# Patient Record
Sex: Female | Born: 1970 | Race: White | Hispanic: No | Smoking: Current every day smoker
Health system: Southern US, Community
[De-identification: ages and names within clinical notes are randomized; demographics above are authoritative.]

## PROBLEM LIST (undated history)

## (undated) DIAGNOSIS — M1990 Unspecified osteoarthritis, unspecified site: Secondary | ICD-10-CM

## (undated) DIAGNOSIS — K219 Gastro-esophageal reflux disease without esophagitis: Secondary | ICD-10-CM

## (undated) DIAGNOSIS — F429 Obsessive-compulsive disorder, unspecified: Secondary | ICD-10-CM

## (undated) DIAGNOSIS — F419 Anxiety disorder, unspecified: Secondary | ICD-10-CM

## (undated) DIAGNOSIS — R079 Chest pain, unspecified: Secondary | ICD-10-CM

## (undated) DIAGNOSIS — M479 Spondylosis, unspecified: Secondary | ICD-10-CM

## (undated) DIAGNOSIS — E119 Type 2 diabetes mellitus without complications: Secondary | ICD-10-CM

## (undated) DIAGNOSIS — G5790 Unspecified mononeuropathy of unspecified lower limb: Secondary | ICD-10-CM

## (undated) DIAGNOSIS — F32A Depression, unspecified: Secondary | ICD-10-CM

## (undated) DIAGNOSIS — F988 Other specified behavioral and emotional disorders with onset usually occurring in childhood and adolescence: Secondary | ICD-10-CM

## (undated) DIAGNOSIS — G709 Myoneural disorder, unspecified: Secondary | ICD-10-CM

## (undated) DIAGNOSIS — F329 Major depressive disorder, single episode, unspecified: Secondary | ICD-10-CM

## (undated) DIAGNOSIS — G5603 Carpal tunnel syndrome, bilateral upper limbs: Secondary | ICD-10-CM

## (undated) DIAGNOSIS — R918 Other nonspecific abnormal finding of lung field: Secondary | ICD-10-CM

## (undated) DIAGNOSIS — M5020 Other cervical disc displacement, unspecified cervical region: Secondary | ICD-10-CM

## (undated) DIAGNOSIS — K3184 Gastroparesis: Secondary | ICD-10-CM

## (undated) DIAGNOSIS — F4310 Post-traumatic stress disorder, unspecified: Secondary | ICD-10-CM

## (undated) DIAGNOSIS — L988 Other specified disorders of the skin and subcutaneous tissue: Secondary | ICD-10-CM

## (undated) DIAGNOSIS — H9190 Unspecified hearing loss, unspecified ear: Secondary | ICD-10-CM

## (undated) DIAGNOSIS — I639 Cerebral infarction, unspecified: Secondary | ICD-10-CM

## (undated) DIAGNOSIS — M797 Fibromyalgia: Secondary | ICD-10-CM

## (undated) DIAGNOSIS — R440 Auditory hallucinations: Secondary | ICD-10-CM

## (undated) DIAGNOSIS — Z8489 Family history of other specified conditions: Secondary | ICD-10-CM

## (undated) DIAGNOSIS — E236 Other disorders of pituitary gland: Secondary | ICD-10-CM

## (undated) DIAGNOSIS — IMO0002 Reserved for concepts with insufficient information to code with codable children: Secondary | ICD-10-CM

## (undated) DIAGNOSIS — S129XXA Fracture of neck, unspecified, initial encounter: Secondary | ICD-10-CM

## (undated) DIAGNOSIS — L400 Psoriasis vulgaris: Secondary | ICD-10-CM

## (undated) DIAGNOSIS — S92902A Unspecified fracture of left foot, initial encounter for closed fracture: Secondary | ICD-10-CM

## (undated) HISTORY — DX: Major depressive disorder, single episode, unspecified: F32.9

## (undated) HISTORY — DX: Unspecified fracture of left foot, initial encounter for closed fracture: S92.902A

## (undated) HISTORY — DX: Psoriasis vulgaris: L40.0

## (undated) HISTORY — DX: Gastro-esophageal reflux disease without esophagitis: K21.9

## (undated) HISTORY — DX: Type 2 diabetes mellitus without complications: E11.9

## (undated) HISTORY — PX: SPINE SURGERY: SHX786

## (undated) HISTORY — DX: Reserved for concepts with insufficient information to code with codable children: IMO0002

## (undated) HISTORY — DX: Unspecified mononeuropathy of unspecified lower limb: G57.90

## (undated) HISTORY — DX: Auditory hallucinations: R44.0

## (undated) HISTORY — DX: Unspecified hearing loss, unspecified ear: H91.90

## (undated) HISTORY — DX: Other nonspecific abnormal finding of lung field: R91.8

## (undated) HISTORY — PX: NASAL SEPTUM SURGERY: SHX37

## (undated) HISTORY — PX: OTHER SURGICAL HISTORY: SHX169

## (undated) HISTORY — DX: Other cervical disc displacement, unspecified cervical region: M50.20

## (undated) HISTORY — DX: Carpal tunnel syndrome, bilateral upper limbs: G56.03

## (undated) HISTORY — DX: Gastroparesis: K31.84

## (undated) HISTORY — DX: Obsessive-compulsive disorder, unspecified: F42.9

## (undated) HISTORY — DX: Anxiety disorder, unspecified: F41.9

## (undated) HISTORY — DX: Unspecified osteoarthritis, unspecified site: M19.90

## (undated) HISTORY — DX: Other specified behavioral and emotional disorders with onset usually occurring in childhood and adolescence: F98.8

## (undated) HISTORY — DX: Spondylosis, unspecified: M47.9

## (undated) HISTORY — PX: FRACTURE SURGERY: SHX138

## (undated) HISTORY — DX: Myoneural disorder, unspecified: G70.9

## (undated) HISTORY — DX: Other disorders of pituitary gland: E23.6

## (undated) HISTORY — DX: Chest pain, unspecified: R07.9

## (undated) HISTORY — DX: Post-traumatic stress disorder, unspecified: F43.10

## (undated) HISTORY — DX: Fracture of neck, unspecified, initial encounter: S12.9XXA

## (undated) HISTORY — DX: Other specified disorders of the skin and subcutaneous tissue: L98.8

## (undated) HISTORY — DX: Depression, unspecified: F32.A

## (undated) HISTORY — PX: MOUTH SURGERY: SHX715

---

## 2006-02-09 DIAGNOSIS — S129XXA Fracture of neck, unspecified, initial encounter: Secondary | ICD-10-CM

## 2006-02-09 HISTORY — DX: Fracture of neck, unspecified, initial encounter: S12.9XXA

## 2006-02-19 ENCOUNTER — Emergency Department: Payer: Self-pay | Admitting: Internal Medicine

## 2007-04-18 ENCOUNTER — Ambulatory Visit: Payer: Self-pay | Admitting: Family Medicine

## 2008-07-14 ENCOUNTER — Emergency Department: Payer: Self-pay | Admitting: Emergency Medicine

## 2008-08-28 ENCOUNTER — Emergency Department: Payer: Self-pay | Admitting: Emergency Medicine

## 2009-07-11 DIAGNOSIS — L0100 Impetigo, unspecified: Secondary | ICD-10-CM | POA: Insufficient documentation

## 2011-03-10 DIAGNOSIS — M47812 Spondylosis without myelopathy or radiculopathy, cervical region: Secondary | ICD-10-CM | POA: Insufficient documentation

## 2011-06-29 ENCOUNTER — Ambulatory Visit: Payer: Self-pay | Admitting: Family Medicine

## 2011-12-22 ENCOUNTER — Encounter: Payer: Self-pay | Admitting: Physical Medicine & Rehabilitation

## 2012-01-12 ENCOUNTER — Ambulatory Visit: Payer: Self-pay | Admitting: Physical Medicine & Rehabilitation

## 2012-01-22 ENCOUNTER — Ambulatory Visit (HOSPITAL_BASED_OUTPATIENT_CLINIC_OR_DEPARTMENT_OTHER): Payer: Self-pay | Admitting: Physical Medicine & Rehabilitation

## 2012-01-22 ENCOUNTER — Encounter: Payer: Self-pay | Attending: Physical Medicine & Rehabilitation

## 2012-01-22 ENCOUNTER — Telehealth: Payer: Self-pay

## 2012-01-22 ENCOUNTER — Encounter: Payer: Self-pay | Admitting: Physical Medicine & Rehabilitation

## 2012-01-22 ENCOUNTER — Other Ambulatory Visit: Payer: Self-pay | Admitting: *Deleted

## 2012-01-22 VITALS — BP 115/67 | HR 103 | Resp 14 | Ht 65.5 in | Wt 179.6 lb

## 2012-01-22 DIAGNOSIS — IMO0001 Reserved for inherently not codable concepts without codable children: Secondary | ICD-10-CM

## 2012-01-22 DIAGNOSIS — Z981 Arthrodesis status: Secondary | ICD-10-CM | POA: Insufficient documentation

## 2012-01-22 DIAGNOSIS — F17200 Nicotine dependence, unspecified, uncomplicated: Secondary | ICD-10-CM | POA: Insufficient documentation

## 2012-01-22 DIAGNOSIS — M797 Fibromyalgia: Secondary | ICD-10-CM | POA: Insufficient documentation

## 2012-01-22 DIAGNOSIS — M545 Low back pain, unspecified: Secondary | ICD-10-CM | POA: Insufficient documentation

## 2012-01-22 DIAGNOSIS — G5600 Carpal tunnel syndrome, unspecified upper limb: Secondary | ICD-10-CM

## 2012-01-22 DIAGNOSIS — F4310 Post-traumatic stress disorder, unspecified: Secondary | ICD-10-CM | POA: Insufficient documentation

## 2012-01-22 DIAGNOSIS — G5601 Carpal tunnel syndrome, right upper limb: Secondary | ICD-10-CM | POA: Insufficient documentation

## 2012-01-22 DIAGNOSIS — R209 Unspecified disturbances of skin sensation: Secondary | ICD-10-CM | POA: Insufficient documentation

## 2012-01-22 DIAGNOSIS — M542 Cervicalgia: Secondary | ICD-10-CM | POA: Insufficient documentation

## 2012-01-22 MED ORDER — GABAPENTIN 100 MG PO CAPS: 100.0000 mg | ORAL_CAPSULE | Freq: Three times a day (TID) | ORAL | Status: DC

## 2012-01-22 MED ORDER — TRAMADOL HCL 50 MG PO TABS: 50.0000 mg | ORAL_TABLET | Freq: Three times a day (TID) | ORAL | Status: DC | PRN

## 2012-01-22 MED ORDER — CYCLOBENZAPRINE HCL 10 MG PO TABS: ORAL_TABLET | ORAL | Status: DC

## 2012-01-22 NOTE — Telephone Encounter (Signed)
Spoke with patient earlier. Prescription was escribed over to [facility]
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
## 2012-01-22 NOTE — Telephone Encounter (Signed)
Patient called stating Flexeril was going to be sent to [facility] pharmacy. Patient is at her pharmacy and they do not have her prescription. Prescription was Escribed. Patient aware.

## 2012-01-22 NOTE — Patient Instructions (Addendum)
Change Flexeril to 20 mg at night and 10 mg in the day only as needed  Buy a right wrist splint and wear it at night

## 2012-01-22 NOTE — Telephone Encounter (Signed)
Patient is at [facility] and says they do not have flexeril script.  Please call.

## 2012-02-26 ENCOUNTER — Ambulatory Visit: Payer: Self-pay | Admitting: Physical Medicine & Rehabilitation

## 2012-02-26 ENCOUNTER — Encounter: Payer: Self-pay | Attending: Physical Medicine & Rehabilitation

## 2012-02-26 DIAGNOSIS — M542 Cervicalgia: Secondary | ICD-10-CM | POA: Insufficient documentation

## 2012-02-26 DIAGNOSIS — M545 Low back pain, unspecified: Secondary | ICD-10-CM | POA: Insufficient documentation

## 2012-02-26 DIAGNOSIS — IMO0001 Reserved for inherently not codable concepts without codable children: Secondary | ICD-10-CM | POA: Insufficient documentation

## 2012-02-26 DIAGNOSIS — Z981 Arthrodesis status: Secondary | ICD-10-CM | POA: Insufficient documentation

## 2012-02-26 DIAGNOSIS — F4310 Post-traumatic stress disorder, unspecified: Secondary | ICD-10-CM | POA: Insufficient documentation

## 2012-02-26 DIAGNOSIS — F17200 Nicotine dependence, unspecified, uncomplicated: Secondary | ICD-10-CM | POA: Insufficient documentation

## 2012-02-26 DIAGNOSIS — R209 Unspecified disturbances of skin sensation: Secondary | ICD-10-CM | POA: Insufficient documentation

## 2012-03-15 ENCOUNTER — Other Ambulatory Visit: Payer: Self-pay | Admitting: Physical Medicine & Rehabilitation

## 2012-03-22 ENCOUNTER — Encounter: Payer: Self-pay | Attending: Physical Medicine & Rehabilitation

## 2012-03-22 ENCOUNTER — Ambulatory Visit (HOSPITAL_BASED_OUTPATIENT_CLINIC_OR_DEPARTMENT_OTHER): Payer: Self-pay | Admitting: Physical Medicine & Rehabilitation

## 2012-03-22 ENCOUNTER — Encounter: Payer: Self-pay | Admitting: Physical Medicine & Rehabilitation

## 2012-03-22 VITALS — BP 117/67 | HR 112 | Resp 14 | Ht 65.0 in | Wt 176.0 lb

## 2012-03-22 DIAGNOSIS — G5601 Carpal tunnel syndrome, right upper limb: Secondary | ICD-10-CM

## 2012-03-22 DIAGNOSIS — IMO0001 Reserved for inherently not codable concepts without codable children: Secondary | ICD-10-CM | POA: Insufficient documentation

## 2012-03-22 DIAGNOSIS — M545 Low back pain, unspecified: Secondary | ICD-10-CM | POA: Insufficient documentation

## 2012-03-22 DIAGNOSIS — R209 Unspecified disturbances of skin sensation: Secondary | ICD-10-CM | POA: Insufficient documentation

## 2012-03-22 DIAGNOSIS — F4310 Post-traumatic stress disorder, unspecified: Secondary | ICD-10-CM | POA: Insufficient documentation

## 2012-03-22 DIAGNOSIS — M542 Cervicalgia: Secondary | ICD-10-CM | POA: Insufficient documentation

## 2012-03-22 DIAGNOSIS — G5600 Carpal tunnel syndrome, unspecified upper limb: Secondary | ICD-10-CM

## 2012-03-22 DIAGNOSIS — F17200 Nicotine dependence, unspecified, uncomplicated: Secondary | ICD-10-CM | POA: Insufficient documentation

## 2012-03-22 DIAGNOSIS — Z981 Arthrodesis status: Secondary | ICD-10-CM | POA: Insufficient documentation

## 2012-03-22 DIAGNOSIS — G5610 Other lesions of median nerve, unspecified upper limb: Secondary | ICD-10-CM

## 2012-03-22 MED ORDER — TRAMADOL HCL 50 MG PO TABS: 50.0000 mg | ORAL_TABLET | Freq: Four times a day (QID) | ORAL | Status: DC | PRN

## 2012-03-22 NOTE — Patient Instructions (Signed)

## 2012-03-22 NOTE — Progress Notes (Signed)
Subjective:    Patient ID: Becky Hubbard, female    DOB: [birth year], 42 y.o.   MRN: [id number]  HPI Right hand numbness Pain Inventory Average Pain 7 Pain Right Now 7 My pain is sharp, dull, tingling and aching  In the last 24 hours, has pain interfered with the following? General activity 5 Relation with others 7 Enjoyment of life 6 What TIME of day is your pain at its worst? morning and day Sleep (in general) Poor  Pain is worse with: bending, sitting, standing and some activites Pain improves with: rest, heat/ice, therapy/exercise, medication and TENS Relief from Meds: 3  Mobility walk without assistance how many minutes can you walk? 15 ability to climb steps?  yes do you drive?  yes transfers alone Do you have any goals in this area?  yes  Function not employed: date last employed unknown I need assistance with the following:  dressing, meal prep, household duties and shopping Do you have any goals in this area?  yes  Neuro/Psych weakness numbness tremor tingling trouble walking spasms dizziness confusion depression anxiety loss of taste or smell  Prior Studies Any changes since last visit?  no  Physicians involved in your care Any changes since last visit?  no   Family History  Problem Relation Age of Onset  . Cancer Mother 63    breast cancer  . Cancer Father 91    of spine , pancreas and liver  . Diabetes Father    History   Social History  . Marital Status: Unknown    Spouse Name: N/A    Number of Children: N/A  . Years of Education: N/A   Social History Main Topics  . Smoking status: Current Some Day Smoker -- 0.30 packs/day for 12 years    Types: Cigarettes    Start date: 03/01/2011  . Smokeless tobacco: Never Used  . Alcohol Use: None  . Drug Use: None  . Sexually Active: None   Other Topics Concern  . None   Social History Narrative  . None   Past Surgical History  Procedure Laterality Date  . Spine surgery     cervical  . Nasal septum surgery    . Fracture surgery    . Occipital nerve blocks    . Cervical radiofrequency neurotomy     Past Medical History  Diagnosis Date  . GERD (gastroesophageal reflux disease)   . Depression   . Arthritis   . Neuromuscular disorder     fibromyalgia/chronic fatique syndrome  . Plaque psoriasis   . Other specified disorder of skin     neurological skin disorder-breaks out when nervous  . Ulcer   . Anxiety    BP 117/67  Pulse 112  Resp 14  Ht 5\' 5"  (1.651 m)  Wt 176 lb (79.833 kg)  BMI 29.29 kg/m2  SpO2 98%     Review of Systems  Constitutional: Positive for appetite change and unexpected weight change.  Respiratory: Positive for cough.   Musculoskeletal: Positive for gait problem.  Neurological: Positive for dizziness, tremors, weakness and numbness.  Psychiatric/Behavioral: Positive for confusion and dysphoric mood. The patient is nervous/anxious.   All other systems reviewed and are negative.       Objective:   Physical Exam        Assessment & Plan:  Ultrasound right wrist Distal cross-section 1.38 cm Proximal cross-section 0.91 cm Ratio 1.55 This is diagnostic of carpal tunnel syndrome caused by median nerve compression at the wrist Plan  is to schedule for carpal tunnel injection under ultrasound guidance Continue wrist splint Not using oral corticosteroids due to poor tolerance Discussed with patient agrees with plan Asking for higher dose tramadol. We'll go up to 4 times per day.

## 2012-03-31 ENCOUNTER — Other Ambulatory Visit: Payer: Self-pay | Admitting: Physical Medicine & Rehabilitation

## 2012-04-19 ENCOUNTER — Ambulatory Visit (HOSPITAL_BASED_OUTPATIENT_CLINIC_OR_DEPARTMENT_OTHER): Payer: Self-pay | Admitting: Physical Medicine & Rehabilitation

## 2012-04-19 ENCOUNTER — Encounter: Payer: Self-pay | Attending: Physical Medicine & Rehabilitation

## 2012-04-19 ENCOUNTER — Encounter: Payer: Self-pay | Admitting: Physical Medicine & Rehabilitation

## 2012-04-19 VITALS — BP 123/56 | HR 109 | Resp 14 | Ht 65.0 in | Wt 187.0 lb

## 2012-04-19 DIAGNOSIS — M542 Cervicalgia: Secondary | ICD-10-CM | POA: Insufficient documentation

## 2012-04-19 DIAGNOSIS — Z981 Arthrodesis status: Secondary | ICD-10-CM | POA: Insufficient documentation

## 2012-04-19 DIAGNOSIS — F4310 Post-traumatic stress disorder, unspecified: Secondary | ICD-10-CM | POA: Insufficient documentation

## 2012-04-19 DIAGNOSIS — G5600 Carpal tunnel syndrome, unspecified upper limb: Secondary | ICD-10-CM

## 2012-04-19 DIAGNOSIS — IMO0001 Reserved for inherently not codable concepts without codable children: Secondary | ICD-10-CM | POA: Insufficient documentation

## 2012-04-19 DIAGNOSIS — R209 Unspecified disturbances of skin sensation: Secondary | ICD-10-CM | POA: Insufficient documentation

## 2012-04-19 DIAGNOSIS — G5601 Carpal tunnel syndrome, right upper limb: Secondary | ICD-10-CM

## 2012-04-19 DIAGNOSIS — M545 Low back pain, unspecified: Secondary | ICD-10-CM | POA: Insufficient documentation

## 2012-04-19 DIAGNOSIS — F17200 Nicotine dependence, unspecified, uncomplicated: Secondary | ICD-10-CM | POA: Insufficient documentation

## 2012-04-19 MED ORDER — GABAPENTIN 100 MG PO CAPS: 100.0000 mg | ORAL_CAPSULE | Freq: Three times a day (TID) | ORAL | Status: DC

## 2012-04-19 NOTE — Patient Instructions (Signed)
We did a carpal tunnel injection I injected Depo-Medrol as well as lidocaine We can repeat this in 3 months if needed

## 2012-04-19 NOTE — Progress Notes (Signed)
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 Symptoms are not relieved by conservative care.  Informed consent was obtained after describing risks and benefits of the procedure with the patient. These include bleeding bruising and infection as well as nerve injury. Patient elected to proceed and has given written consent. The distal wrist crease was marked and prepped with Betadine. A 30-gauge 1/2 inch needle was inserted and 0.25 ML's of 1% lidocaine injected into the skin and subcutaneous tissue. Then a 27-gauge 1/2 inch needle was inserted into the carpal tunnel and 0.25 mL of depomedrol 40 mg per mL was injected. Patient tolerated procedure well. Post procedure instructions given.  

## 2012-06-22 ENCOUNTER — Other Ambulatory Visit: Payer: Self-pay

## 2012-06-22 ENCOUNTER — Other Ambulatory Visit: Payer: Self-pay | Admitting: *Deleted

## 2012-06-22 MED ORDER — TRAMADOL HCL 50 MG PO TABS: 50.0000 mg | ORAL_TABLET | Freq: Four times a day (QID) | ORAL | Status: DC | PRN

## 2012-07-21 DIAGNOSIS — K30 Functional dyspepsia: Secondary | ICD-10-CM | POA: Insufficient documentation

## 2012-07-21 DIAGNOSIS — R0789 Other chest pain: Secondary | ICD-10-CM | POA: Insufficient documentation

## 2012-07-21 DIAGNOSIS — K3184 Gastroparesis: Secondary | ICD-10-CM | POA: Insufficient documentation

## 2012-07-21 DIAGNOSIS — R079 Chest pain, unspecified: Secondary | ICD-10-CM | POA: Insufficient documentation

## 2012-07-22 ENCOUNTER — Ambulatory Visit: Payer: Self-pay | Admitting: Physical Medicine & Rehabilitation

## 2012-07-29 ENCOUNTER — Ambulatory Visit: Payer: Self-pay | Admitting: Physical Medicine & Rehabilitation

## 2012-09-01 ENCOUNTER — Other Ambulatory Visit: Payer: Self-pay

## 2012-09-01 MED ORDER — TRAMADOL HCL 50 MG PO TABS: 50.0000 mg | ORAL_TABLET | Freq: Four times a day (QID) | ORAL | Status: DC | PRN

## 2012-09-27 ENCOUNTER — Ambulatory Visit: Payer: Self-pay | Admitting: Physical Medicine & Rehabilitation

## 2012-10-03 ENCOUNTER — Encounter: Payer: Self-pay | Attending: Physical Medicine & Rehabilitation

## 2012-10-03 ENCOUNTER — Encounter: Payer: Self-pay | Admitting: Physical Medicine & Rehabilitation

## 2012-10-03 ENCOUNTER — Ambulatory Visit (HOSPITAL_BASED_OUTPATIENT_CLINIC_OR_DEPARTMENT_OTHER): Payer: No Typology Code available for payment source | Admitting: Physical Medicine & Rehabilitation

## 2012-10-03 VITALS — BP 110/65 | HR 91 | Resp 14 | Ht 65.0 in | Wt 175.0 lb

## 2012-10-03 DIAGNOSIS — M797 Fibromyalgia: Secondary | ICD-10-CM

## 2012-10-03 DIAGNOSIS — M542 Cervicalgia: Secondary | ICD-10-CM

## 2012-10-03 DIAGNOSIS — K219 Gastro-esophageal reflux disease without esophagitis: Secondary | ICD-10-CM | POA: Insufficient documentation

## 2012-10-03 DIAGNOSIS — IMO0001 Reserved for inherently not codable concepts without codable children: Secondary | ICD-10-CM | POA: Insufficient documentation

## 2012-10-03 DIAGNOSIS — Z79899 Other long term (current) drug therapy: Secondary | ICD-10-CM

## 2012-10-03 DIAGNOSIS — G5600 Carpal tunnel syndrome, unspecified upper limb: Secondary | ICD-10-CM | POA: Insufficient documentation

## 2012-10-03 DIAGNOSIS — R209 Unspecified disturbances of skin sensation: Secondary | ICD-10-CM | POA: Insufficient documentation

## 2012-10-03 DIAGNOSIS — F17200 Nicotine dependence, unspecified, uncomplicated: Secondary | ICD-10-CM | POA: Insufficient documentation

## 2012-10-03 DIAGNOSIS — Z5181 Encounter for therapeutic drug level monitoring: Secondary | ICD-10-CM

## 2012-10-03 NOTE — Patient Instructions (Signed)
Will check next x-rays. We can discuss the results at next visit if there is anything serious will call you before that time If left hand not much better next visit will do carpal tunnel injection. Continue to wear the splint on the left hand
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
## 2012-10-12 ENCOUNTER — Other Ambulatory Visit: Payer: Self-pay | Admitting: *Deleted

## 2012-10-12 MED ORDER — TRAMADOL HCL 50 MG PO TABS: 50.0000 mg | ORAL_TABLET | Freq: Four times a day (QID) | ORAL | Status: DC | PRN

## 2012-10-20 ENCOUNTER — Telehealth: Payer: Self-pay

## 2012-10-20 NOTE — Telephone Encounter (Signed)
Message copied by Judd Gaudier on Thu Oct 20, 2012  1:42 PM ------      Message from: Su Monks      Created: Thu Oct 20, 2012 11:04 AM       Non narcotic treatment only.  Inform pt we cannot prescribe tramadol .  See if she still wants to follow up for wrist injection ------

## 2012-10-20 NOTE — Telephone Encounter (Signed)
Patient informed due to inconsistent urine drug screen we can no longer prescribe tramadol.  Offered to follow her for wrist injections but she denied.  Patient says she will seek another provider.

## 2012-10-26 ENCOUNTER — Telehealth: Payer: Self-pay | Admitting: Physical Medicine & Rehabilitation

## 2012-10-26 NOTE — Telephone Encounter (Signed)
Patient was discharged due to an issue with Tramadol and UDS.  Was offered to receive only injections, without meds, but patient declined.  She has reconsidered, and would like to continue to get injections, even if she cannot get Tramadol.  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  Anyway, wants to know: is OK to schedule a new appointment for carpal tunnel injection?

## 2012-10-26 NOTE — Telephone Encounter (Signed)
You would have to ask Dr. Doroteo Bradford whether he would be willing to give her this injection

## 2012-10-26 NOTE — Telephone Encounter (Signed)
See Karen's message below.  Thanks.

## 2012-10-27 NOTE — Telephone Encounter (Signed)
I can give Carpal tunnel injection, may schedule

## 2012-10-28 ENCOUNTER — Telehealth: Payer: Self-pay

## 2012-10-28 NOTE — Telephone Encounter (Signed)
Called and left a voicemail for patient to return call to the clinic.

## 2012-10-28 NOTE — Telephone Encounter (Signed)
Left voice mail for patient to return call to clinic.

## 2012-11-01 ENCOUNTER — Ambulatory Visit: Payer: No Typology Code available for payment source | Admitting: Physical Medicine & Rehabilitation

## 2012-11-10 ENCOUNTER — Telehealth: Payer: Self-pay | Admitting: *Deleted

## 2012-11-11 ENCOUNTER — Telehealth: Payer: Self-pay

## 2012-11-11 NOTE — Telephone Encounter (Signed)
Patient called regarding care.

## 2012-11-11 NOTE — Telephone Encounter (Signed)
I spoke with Becky Hubbard, Becky Hubbard mother since Becky Hubbard was asleep.  She was aware of issues with UDS and I explained that there were medications that were not prescribed in her urine and therefore Dr Wynn Banker will no longer prescribe any pain medication. Tramadol is classified by DEA as a CIV and has abuse potential so this medication falls under the narcotic umbrella.  She was concerned about what Becky Hubbard would do about her back pain.  I referred her back to primary care to see if they would prescribe for her or send her to a new pain clinic.  She brought up a previous pain clinic in [facility] and I questioned why it was that they would not prescribe.  She did not elaborate but I told her that Dr Wynn Banker has agreed to see her for injection only but no medications will be prescribed.

## 2012-11-11 NOTE — Telephone Encounter (Signed)
appt scheduled for CTS injection by [facility]

## 2012-11-28 ENCOUNTER — Telehealth: Payer: Self-pay

## 2012-11-28 NOTE — Telephone Encounter (Signed)
Patient called to let us know that the dentist had prescribed her oxycodone 10-325 #24 for some work so had done on 11/24/12.

## 2012-12-05 ENCOUNTER — Ambulatory Visit (HOSPITAL_BASED_OUTPATIENT_CLINIC_OR_DEPARTMENT_OTHER): Payer: Self-pay | Admitting: Physical Medicine & Rehabilitation

## 2012-12-05 ENCOUNTER — Encounter: Payer: Medicaid Other | Attending: Physical Medicine & Rehabilitation

## 2012-12-05 ENCOUNTER — Encounter: Payer: Self-pay | Admitting: Physical Medicine & Rehabilitation

## 2012-12-05 VITALS — BP 133/80 | HR 108 | Resp 14 | Ht 65.0 in | Wt 186.4 lb

## 2012-12-05 DIAGNOSIS — IMO0001 Reserved for inherently not codable concepts without codable children: Secondary | ICD-10-CM | POA: Insufficient documentation

## 2012-12-05 DIAGNOSIS — M542 Cervicalgia: Secondary | ICD-10-CM | POA: Insufficient documentation

## 2012-12-05 DIAGNOSIS — K219 Gastro-esophageal reflux disease without esophagitis: Secondary | ICD-10-CM | POA: Insufficient documentation

## 2012-12-05 DIAGNOSIS — G5600 Carpal tunnel syndrome, unspecified upper limb: Secondary | ICD-10-CM

## 2012-12-05 DIAGNOSIS — F17200 Nicotine dependence, unspecified, uncomplicated: Secondary | ICD-10-CM | POA: Insufficient documentation

## 2012-12-05 DIAGNOSIS — R209 Unspecified disturbances of skin sensation: Secondary | ICD-10-CM | POA: Insufficient documentation

## 2012-12-05 DIAGNOSIS — G5601 Carpal tunnel syndrome, right upper limb: Secondary | ICD-10-CM

## 2012-12-05 MED ORDER — TRAMADOL HCL 50 MG PO TABS: 50.0000 mg | ORAL_TABLET | Freq: Three times a day (TID) | ORAL | Status: DC | PRN

## 2012-12-05 NOTE — Progress Notes (Signed)
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 Symptoms are not relieved by conservative care.  Informed consent was obtained after describing risks and benefits of the procedure with the patient. These include bleeding bruising and infection as well as nerve injury. Patient elected to proceed and has given written consent. The distal wrist crease was marked and prepped with Betadine. A 30-gauge 1/2 inch needle was inserted and 0.25 ML's of 1% lidocaine injected into the skin and subcutaneous tissue. Then a 27-gauge 1/2 inch needle was inserted into the carpal tunnel and 0.25 mL of depomedrol 40 mg per mL was injected. Patient tolerated procedure well. Post procedure instructions given.  We discussed the urine drug screen results. Tramadol was detected Hydrocodone also was detected. She states that was an old prescription from Dr. Renae Fickle benzos detected, on diazepam chronically from Psychiatrist Dr Claudius Sis May resume tramadol prescription she only takes 3 times a day will reduce to 90 tablets per month with 5 refill  Return to clinic 6 months for repeat injection

## 2012-12-05 NOTE — Patient Instructions (Signed)
Finish the hydrocodone that is prescribed by your oral surgeon prior to resuming tramadol

## 2012-12-14 ENCOUNTER — Telehealth: Payer: Self-pay

## 2012-12-14 MED ORDER — CYCLOBENZAPRINE HCL 10 MG PO TABS: ORAL_TABLET | ORAL | Status: DC

## 2012-12-14 NOTE — Telephone Encounter (Signed)
Refill request for flexeril 10mg  takes 2 tablets by mouth at bedtime and 1 tablet once a day as needed to [facility] pharmacy.  Please advise.

## 2012-12-14 NOTE — Telephone Encounter (Signed)
May rx #90 one refill

## 2013-01-31 ENCOUNTER — Other Ambulatory Visit: Payer: Self-pay | Admitting: Physical Medicine & Rehabilitation

## 2013-05-22 ENCOUNTER — Ambulatory Visit (HOSPITAL_BASED_OUTPATIENT_CLINIC_OR_DEPARTMENT_OTHER): Payer: Self-pay | Admitting: Physical Medicine & Rehabilitation

## 2013-05-22 ENCOUNTER — Encounter: Payer: Self-pay | Admitting: Physical Medicine & Rehabilitation

## 2013-05-22 ENCOUNTER — Encounter: Payer: Medicaid Other | Attending: Physical Medicine & Rehabilitation

## 2013-05-22 VITALS — BP 130/71 | HR 112 | Resp 14 | Ht 65.0 in | Wt 185.0 lb

## 2013-05-22 DIAGNOSIS — F329 Major depressive disorder, single episode, unspecified: Secondary | ICD-10-CM | POA: Insufficient documentation

## 2013-05-22 DIAGNOSIS — K219 Gastro-esophageal reflux disease without esophagitis: Secondary | ICD-10-CM | POA: Diagnosis not present

## 2013-05-22 DIAGNOSIS — F3289 Other specified depressive episodes: Secondary | ICD-10-CM | POA: Diagnosis not present

## 2013-05-22 DIAGNOSIS — F17200 Nicotine dependence, unspecified, uncomplicated: Secondary | ICD-10-CM | POA: Diagnosis not present

## 2013-05-22 DIAGNOSIS — IMO0001 Reserved for inherently not codable concepts without codable children: Secondary | ICD-10-CM

## 2013-05-22 DIAGNOSIS — G5600 Carpal tunnel syndrome, unspecified upper limb: Secondary | ICD-10-CM

## 2013-05-22 DIAGNOSIS — M797 Fibromyalgia: Secondary | ICD-10-CM

## 2013-05-22 MED ORDER — TOPIRAMATE 25 MG PO TABS
25.0000 mg | ORAL_TABLET | Freq: Every day | ORAL | Status: DC
Start: 2013-05-22 — End: 2013-10-09

## 2013-05-22 MED ORDER — TRAMADOL HCL 50 MG PO TABS: 50.0000 mg | ORAL_TABLET | Freq: Four times a day (QID) | ORAL | Status: DC

## 2013-05-22 MED ORDER — CYCLOBENZAPRINE HCL 10 MG PO TABS: 10.0000 mg | ORAL_TABLET | Freq: Every day | ORAL | Status: DC

## 2013-05-22 NOTE — Patient Instructions (Signed)
Continue to exercise  Next visit  We may do either carpal tunnel or neck injection

## 2013-05-22 NOTE — Progress Notes (Signed)
Subjective:    Patient ID: Becky Hubbard, female    DOB: [birth year], 43 y.o.   MRN: [id number]  HPI Patient worked up for TIA, incidental finding of pituitary tumor now getting further treatment at [facility] hospital Pain Inventory Average Pain 9 Pain Right Now 9 My pain is constant, burning, dull, stabbing and aching  In the last 24 hours, has pain interfered with the following? General activity 10 Relation with others 10 Enjoyment of life 10 What TIME of day is your pain at its worst? morning, daytime, night Sleep (in general) Poor  Pain is worse with: walking, bending, sitting, inactivity, standing and some activites Pain improves with: rest, heat/ice, therapy/exercise, medication and injections Relief from Meds: 4  Mobility walk without assistance how many minutes can you walk? 5 ability to climb steps?  yes do you drive?  yes transfers alone Do you have any goals in this area?  yes  Function not employed: date last employed 2005 I need assistance with the following:  household duties and shopping Do you have any goals in this area?  yes  Neuro/Psych bladder control problems weakness numbness tremor tingling spasms dizziness confusion depression anxiety  Prior Studies Any changes since last visit?  yes  Physicians involved in your care Any changes since last visit?  no   Family History  Problem Relation Age of Onset  . Cancer Mother 15    breast cancer  . Cancer Father 68    of spine , pancreas and liver  . Diabetes Father    History   Social History  . Marital Status: Unknown    Spouse Name: N/A    Number of Children: N/A  . Years of Education: N/A   Social History Main Topics  . Smoking status: Current Some Day Smoker -- 0.30 packs/day for 12 years    Types: Cigarettes    Start date: 03/01/2011  . Smokeless tobacco: Never Used  . Alcohol Use: None  . Drug Use: None  . Sexual Activity: None   Other Topics Concern  . None   Social History  Narrative  . None   Past Surgical History  Procedure Laterality Date  . Spine surgery      cervical  . Nasal septum surgery    . Fracture surgery    . Occipital nerve blocks    . Cervical radiofrequency neurotomy     Past Medical History  Diagnosis Date  . GERD (gastroesophageal reflux disease)   . Depression   . Arthritis   . Neuromuscular disorder     fibromyalgia/chronic fatique syndrome  . Plaque psoriasis   . Other specified disorder of skin     neurological skin disorder-breaks out when nervous  . Ulcer   . Anxiety    BP 130/71  Pulse 112  Resp 14  Ht 5\' 5"  (1.651 m)  Wt 185 lb (83.915 kg)  BMI 30.79 kg/m2  SpO2 96%  Opioid Risk Score:   Fall Risk Score: Moderate Fall Risk (6-13 points) (pt educated and given brochure on fall risk previously)    Review of Systems  Constitutional: Positive for diaphoresis, appetite change and unexpected weight change.  Respiratory: Positive for cough and shortness of breath.   Gastrointestinal: Positive for nausea, abdominal pain and constipation.  Endocrine:       High blood sugar   Genitourinary:       Bladder control problems  Neurological: Positive for dizziness, tremors, weakness and numbness.  Tingling, spasms  Psychiatric/Behavioral: Positive for confusion and dysphoric mood. The patient is nervous/anxious.   All other systems reviewed and are negative.      Objective:   Physical Exam        Assessment & Plan:  Carpal tunnel injection Left   Indication: Median neuropathy at the wrist, Right wrist much better after Carpal tunnel injection  Informed consent was obtained after describing risks and benefits of the procedure with the patient. These include bleeding bruising and infection as well as nerve injury. Patient elected to proceed and has given written consent. The distal wrist crease was marked and prepped with Betadine. A 30-gauge 1/2 inch needle was inserted and 0.25 ML's of 1% lidocaine  injected into the skin and subcutaneous tissue. Then a 27-gauge 1/2 inch needle was inserted into the carpal tunnel and 0.25 mL of depomedrol 40 mg per mL was injected. Patient tolerated procedure well. Post procedure instructions given.   May resume tramadol prescription she only takes 3 times a day will reduce to 90 tablets per month with 5 refill  Return to clinic 6 months for repeat injection

## 2013-07-13 DIAGNOSIS — L409 Psoriasis, unspecified: Secondary | ICD-10-CM | POA: Insufficient documentation

## 2013-09-18 ENCOUNTER — Telehealth: Payer: Self-pay

## 2013-09-18 NOTE — Telephone Encounter (Signed)
Attempted to contact patient. Left a voicemail to return call to clinic.

## 2013-09-18 NOTE — Telephone Encounter (Signed)
Patient states she was seen for a sinus infection, ear infection, and URI. She was prescribed Tussionex. Patient wanted to know if it is okay to take. Please advise.

## 2013-09-18 NOTE — Telephone Encounter (Signed)
Would not use the tussionex, try guafenesin syrup as alternatinve

## 2013-10-09 ENCOUNTER — Other Ambulatory Visit: Payer: Self-pay | Admitting: Physical Medicine & Rehabilitation

## 2013-11-21 ENCOUNTER — Ambulatory Visit: Payer: Self-pay | Admitting: Physical Medicine & Rehabilitation

## 2013-11-26 ENCOUNTER — Other Ambulatory Visit: Payer: Self-pay | Admitting: Physical Medicine & Rehabilitation

## 2013-11-28 ENCOUNTER — Other Ambulatory Visit: Payer: Self-pay | Admitting: *Deleted

## 2013-11-28 NOTE — Telephone Encounter (Signed)
Refill on tramadol

## 2013-12-19 ENCOUNTER — Ambulatory Visit: Payer: Self-pay | Admitting: Physical Medicine & Rehabilitation

## 2014-01-11 ENCOUNTER — Other Ambulatory Visit: Payer: Self-pay | Admitting: *Deleted

## 2014-01-11 MED ORDER — TOPIRAMATE 25 MG PO TABS: 25.0000 mg | ORAL_TABLET | Freq: Every day | ORAL | Status: DC

## 2014-01-11 NOTE — Telephone Encounter (Signed)
Fax rec'd, Rx refilled electronically per office protocol, patient verbally notified

## 2014-01-15 ENCOUNTER — Ambulatory Visit: Payer: Self-pay | Admitting: Physical Medicine & Rehabilitation

## 2014-01-16 ENCOUNTER — Other Ambulatory Visit: Payer: Self-pay | Admitting: Physical Medicine & Rehabilitation

## 2014-01-19 ENCOUNTER — Ambulatory Visit: Payer: Self-pay | Admitting: Physical Medicine & Rehabilitation

## 2014-02-13 ENCOUNTER — Ambulatory Visit: Payer: Self-pay | Admitting: Physical Medicine & Rehabilitation

## 2014-02-16 ENCOUNTER — Encounter: Payer: Medicaid Other | Attending: Physical Medicine & Rehabilitation

## 2014-02-16 ENCOUNTER — Ambulatory Visit: Payer: Medicaid Other | Admitting: Physical Medicine & Rehabilitation

## 2014-02-22 ENCOUNTER — Telehealth: Payer: Self-pay | Admitting: *Deleted

## 2014-02-22 NOTE — Telephone Encounter (Signed)
Patient called and left message asking  About Topamax?? Has follow up appt on Monday 01/18.  And is also asking if we take Medicare

## 2014-02-23 NOTE — Telephone Encounter (Signed)
I spoke with Becky Hubbard and she had a reaction to topamax. She developed blisters on hands and face broke out.  She stopped medication and she has an appt with [name] on Monday.

## 2014-02-26 ENCOUNTER — Ambulatory Visit: Payer: Medicaid Other | Admitting: Physical Medicine & Rehabilitation

## 2014-02-26 ENCOUNTER — Telehealth: Payer: Self-pay | Admitting: *Deleted

## 2014-02-26 NOTE — Telephone Encounter (Signed)
Ms Becky Hubbard was notified that she cannot reschedule by [facility]

## 2014-04-19 DIAGNOSIS — R947 Abnormal results of other endocrine function studies: Secondary | ICD-10-CM | POA: Insufficient documentation

## 2014-05-16 DIAGNOSIS — D353 Benign neoplasm of craniopharyngeal duct: Secondary | ICD-10-CM

## 2014-05-16 DIAGNOSIS — D352 Benign neoplasm of pituitary gland: Secondary | ICD-10-CM | POA: Insufficient documentation

## 2014-05-24 DIAGNOSIS — F4310 Post-traumatic stress disorder, unspecified: Secondary | ICD-10-CM | POA: Insufficient documentation

## 2014-05-24 DIAGNOSIS — M5020 Other cervical disc displacement, unspecified cervical region: Secondary | ICD-10-CM | POA: Insufficient documentation

## 2014-05-24 DIAGNOSIS — R440 Auditory hallucinations: Secondary | ICD-10-CM | POA: Insufficient documentation

## 2014-06-04 DIAGNOSIS — M79603 Pain in arm, unspecified: Secondary | ICD-10-CM | POA: Insufficient documentation

## 2014-06-04 DIAGNOSIS — R202 Paresthesia of skin: Secondary | ICD-10-CM | POA: Insufficient documentation

## 2014-06-04 DIAGNOSIS — M79601 Pain in right arm: Secondary | ICD-10-CM | POA: Insufficient documentation

## 2014-06-04 DIAGNOSIS — R200 Anesthesia of skin: Secondary | ICD-10-CM | POA: Insufficient documentation

## 2014-06-11 DIAGNOSIS — M5030 Other cervical disc degeneration, unspecified cervical region: Secondary | ICD-10-CM | POA: Insufficient documentation

## 2014-06-11 DIAGNOSIS — M5020 Other cervical disc displacement, unspecified cervical region: Secondary | ICD-10-CM | POA: Insufficient documentation

## 2014-07-30 ENCOUNTER — Ambulatory Visit: Payer: Self-pay | Admitting: Psychiatry

## 2014-07-30 ENCOUNTER — Other Ambulatory Visit: Payer: Self-pay | Admitting: Psychiatry

## 2014-07-31 ENCOUNTER — Ambulatory Visit (INDEPENDENT_AMBULATORY_CARE_PROVIDER_SITE_OTHER): Payer: Medicaid Other | Admitting: Family Medicine

## 2014-07-31 ENCOUNTER — Other Ambulatory Visit: Payer: Self-pay

## 2014-07-31 ENCOUNTER — Encounter: Payer: Self-pay | Admitting: Family Medicine

## 2014-07-31 VITALS — BP 118/78 | HR 105 | Temp 98.9°F | Ht 64.6 in | Wt 176.0 lb

## 2014-07-31 DIAGNOSIS — L309 Dermatitis, unspecified: Secondary | ICD-10-CM | POA: Insufficient documentation

## 2014-07-31 DIAGNOSIS — R202 Paresthesia of skin: Secondary | ICD-10-CM

## 2014-07-31 DIAGNOSIS — L400 Psoriasis vulgaris: Secondary | ICD-10-CM | POA: Insufficient documentation

## 2014-07-31 DIAGNOSIS — IMO0001 Reserved for inherently not codable concepts without codable children: Secondary | ICD-10-CM | POA: Insufficient documentation

## 2014-07-31 DIAGNOSIS — R200 Anesthesia of skin: Secondary | ICD-10-CM

## 2014-07-31 DIAGNOSIS — R002 Palpitations: Secondary | ICD-10-CM | POA: Insufficient documentation

## 2014-07-31 DIAGNOSIS — Z1211 Encounter for screening for malignant neoplasm of colon: Secondary | ICD-10-CM | POA: Insufficient documentation

## 2014-07-31 DIAGNOSIS — Z1239 Encounter for other screening for malignant neoplasm of breast: Secondary | ICD-10-CM

## 2014-07-31 DIAGNOSIS — Z23 Encounter for immunization: Secondary | ICD-10-CM

## 2014-07-31 DIAGNOSIS — E559 Vitamin D deficiency, unspecified: Secondary | ICD-10-CM

## 2014-07-31 DIAGNOSIS — Z0001 Encounter for general adult medical examination with abnormal findings: Secondary | ICD-10-CM

## 2014-07-31 DIAGNOSIS — Z720 Tobacco use: Secondary | ICD-10-CM

## 2014-07-31 DIAGNOSIS — F988 Other specified behavioral and emotional disorders with onset usually occurring in childhood and adolescence: Secondary | ICD-10-CM | POA: Insufficient documentation

## 2014-07-31 DIAGNOSIS — Z0000 Encounter for general adult medical examination without abnormal findings: Secondary | ICD-10-CM

## 2014-07-31 DIAGNOSIS — G988 Other disorders of nervous system: Secondary | ICD-10-CM | POA: Insufficient documentation

## 2014-07-31 DIAGNOSIS — Z309 Encounter for contraceptive management, unspecified: Secondary | ICD-10-CM

## 2014-07-31 DIAGNOSIS — F4310 Post-traumatic stress disorder, unspecified: Secondary | ICD-10-CM

## 2014-07-31 DIAGNOSIS — M549 Dorsalgia, unspecified: Secondary | ICD-10-CM | POA: Insufficient documentation

## 2014-07-31 DIAGNOSIS — M797 Fibromyalgia: Secondary | ICD-10-CM

## 2014-07-31 DIAGNOSIS — L905 Scar conditions and fibrosis of skin: Secondary | ICD-10-CM | POA: Insufficient documentation

## 2014-07-31 DIAGNOSIS — Z9109 Other allergy status, other than to drugs and biological substances: Secondary | ICD-10-CM | POA: Insufficient documentation

## 2014-07-31 DIAGNOSIS — R440 Auditory hallucinations: Secondary | ICD-10-CM

## 2014-07-31 DIAGNOSIS — M5090 Cervical disc disorder, unspecified, unspecified cervical region: Secondary | ICD-10-CM | POA: Insufficient documentation

## 2014-07-31 DIAGNOSIS — R947 Abnormal results of other endocrine function studies: Secondary | ICD-10-CM

## 2014-07-31 DIAGNOSIS — F428 Other obsessive-compulsive disorder: Secondary | ICD-10-CM | POA: Insufficient documentation

## 2014-07-31 DIAGNOSIS — Z13220 Encounter for screening for lipoid disorders: Secondary | ICD-10-CM

## 2014-07-31 MED ORDER — ALBUTEROL SULFATE (2.5 MG/3ML) 0.083% IN NEBU: 2.5000 mg | INHALATION_SOLUTION | Freq: Four times a day (QID) | RESPIRATORY_TRACT | Status: DC | PRN

## 2014-07-31 MED ORDER — NICOTINE 7 MG/24HR TD PT24: 7.0000 mg | MEDICATED_PATCH | Freq: Every day | TRANSDERMAL | Status: DC

## 2014-07-31 MED ORDER — CLONAZEPAM 0.5 MG PO TABS: 0.5000 mg | ORAL_TABLET | Freq: Three times a day (TID) | ORAL | Status: DC | PRN

## 2014-07-31 MED ORDER — NICOTINE 14 MG/24HR TD PT24: 14.0000 mg | MEDICATED_PATCH | Freq: Every day | TRANSDERMAL | Status: DC

## 2014-07-31 MED ORDER — CYCLOBENZAPRINE HCL 10 MG PO TABS: ORAL_TABLET | ORAL | Status: DC

## 2014-07-31 NOTE — Assessment & Plan Note (Signed)
Continue to follow with neurology and neurosurgery.

## 2014-07-31 NOTE — Assessment & Plan Note (Signed)
Continue to follow with psychiatry. Resolved at this time.

## 2014-07-31 NOTE — Assessment & Plan Note (Signed)
Would like to consider nexplanon. Referral to GYN made today. She would like to see [place] [facility]

## 2014-07-31 NOTE — Assessment & Plan Note (Signed)
Up to date on vaccines. Screening labs checked today. Due for mammogram. Up to date on pap. Work on diet and exercise. Continue to monitor.

## 2014-07-31 NOTE — Assessment & Plan Note (Signed)
Continue to follow with psychiatry
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
## 2014-07-31 NOTE — Patient Instructions (Addendum)
Smoking Cessation, Tips for Success If you are ready to quit smoking, congratulations! You have chosen to help yourself be healthier. Cigarettes bring nicotine, tar, carbon monoxide, and other irritants into your body. Your lungs, heart, and blood vessels will be able to work better without these poisons. There are many different ways to quit smoking. Nicotine gum, nicotine patches, a nicotine inhaler, or nicotine nasal spray can help with physical craving. Hypnosis, support groups, and medicines help break the habit of smoking. WHAT THINGS CAN I DO TO MAKE QUITTING EASIER?  Here are some tips to help you quit for good:  Pick a date when you will quit smoking completely. Tell all of your friends and family about your plan to quit on that date.  Do not try to slowly cut down on the number of cigarettes you are smoking. Pick a quit date and quit smoking completely starting on that day.  Throw away all cigarettes.   Clean and remove all ashtrays from your home, work, and car.  On a card, write down your reasons for quitting. Carry the card with you and read it when you get the urge to smoke.  Cleanse your body of nicotine. Drink enough water and fluids to keep your urine clear or pale yellow. Do this after quitting to flush the nicotine from your body.  Learn to predict your moods. Do not let a bad situation be your excuse to have a cigarette. Some situations in your life might tempt you into wanting a cigarette.  Never have "just one" cigarette. It leads to wanting another and another. Remind yourself of your decision to quit.  Change habits associated with smoking. If you smoked while driving or when feeling stressed, try other activities to replace smoking. Stand up when drinking your coffee. Brush your teeth after eating. Sit in a different chair when you read the paper. Avoid alcohol while trying to quit, and try to drink fewer caffeinated beverages. Alcohol and caffeine may urge you to  smoke.  Avoid foods and drinks that can trigger a desire to smoke, such as sugary or spicy foods and alcohol.  Ask people who smoke not to smoke around you.  Have something planned to do right after eating or having a cup of coffee. For example, plan to take a walk or exercise.  Try a relaxation exercise to calm you down and decrease your stress. Remember, you may be tense and nervous for the first 2 weeks after you quit, but this will pass.  Find new activities to keep your hands busy. Play with a pen, coin, or rubber band. Doodle or draw things on paper.  Brush your teeth right after eating. This will help cut down on the craving for the taste of tobacco after meals. You can also try mouthwash.   Use oral substitutes in place of cigarettes. Try using lemon drops, carrots, cinnamon sticks, or chewing gum. Keep them handy so they are available when you have the urge to smoke.  When you have the urge to smoke, try deep breathing.  Designate your home as a nonsmoking area.  If you are a heavy smoker, ask your health care provider about a prescription for nicotine chewing gum. It can ease your withdrawal from nicotine.  Reward yourself. Set aside the cigarette money you save and buy yourself something nice.  Look for support from others. Join a support group or smoking cessation program. Ask someone at home or at work to help you with your plan   to quit smoking.  Always ask yourself, "Do I need this cigarette or is this just a reflex?" Tell yourself, "Today, I choose not to smoke," or "I do not want to smoke." You are reminding yourself of your decision to quit.  Do not replace cigarette smoking with electronic cigarettes (commonly called e-cigarettes). The safety of e-cigarettes is unknown, and some may contain harmful chemicals.  If you relapse, do not give up! Plan ahead and think about what you will do the next time you get the urge to smoke. HOW WILL I FEEL WHEN I QUIT SMOKING? You  may have symptoms of withdrawal because your body is used to nicotine (the addictive substance in cigarettes). You may crave cigarettes, be irritable, feel very hungry, cough often, get headaches, or have difficulty concentrating. The withdrawal symptoms are only temporary. They are strongest when you first quit but will go away within 10-14 days. When withdrawal symptoms occur, stay in control. Think about your reasons for quitting. Remind yourself that these are signs that your body is healing and getting used to being without cigarettes. Remember that withdrawal symptoms are easier to treat than the major diseases that smoking can cause.  Even after the withdrawal is over, expect periodic urges to smoke. However, these cravings are generally short lived and will go away whether you smoke or not. Do not smoke! WHAT RESOURCES ARE AVAILABLE TO HELP ME QUIT SMOKING? Your health care provider can direct you to community resources or hospitals for support, which may include:  Group support.  Education.  Hypnosis.  Therapy. Document Released: 10/25/2003 Document Revised: 06/12/2013 Document Reviewed: 07/14/2012 [facility] Patient Information 2015 [place], [place]. This information is not intended to replace advice given to you by your health care provider. Make sure you discuss any questions you have with your health care provider. Health Maintenance Adopting a healthy lifestyle and getting preventive care can go a long way to promote health and wellness. Talk with your health care provider about what schedule of regular examinations is right for you. This is a good chance for you to check in with your provider about disease prevention and staying healthy. In between checkups, there are plenty of things you can do on your own. Experts have done a lot of research about which lifestyle changes and preventive measures are most likely to keep you healthy. Ask your health care provider for more  information. WEIGHT AND DIET  Eat a healthy diet  Be sure to include plenty of vegetables, fruits, low-fat dairy products, and lean protein.  Do not eat a lot of foods high in solid fats, added sugars, or salt.  Get regular exercise. This is one of the most important things you can do for your health.  Most adults should exercise for at least 150 minutes each week. The exercise should increase your heart rate and make you sweat (moderate-intensity exercise).  Most adults should also do strengthening exercises at least twice a week. This is in addition to the moderate-intensity exercise.  Maintain a healthy weight  Body mass index (BMI) is a measurement that can be used to identify possible weight problems. It estimates body fat based on height and weight. Your health care provider can help determine your BMI and help you achieve or maintain a healthy weight.  For females 62 years of age and older:   A BMI below 18.5 is considered underweight.  A BMI of 18.5 to 24.9 is normal.  A BMI of 25 to 29.9 is considered  overweight.  A BMI of 30 and above is considered obese.  Watch levels of cholesterol and blood lipids  You should start having your blood tested for lipids and cholesterol at 45 years of age, then have this test every 5 years.  You may need to have your cholesterol levels checked more often if:  Your lipid or cholesterol levels are high.  You are older than 45 years of age.  You are at high risk for heart disease.  CANCER SCREENING   Lung Cancer  Lung cancer screening is recommended for adults 55-36 years old who are at high risk for lung cancer because of a history of smoking.  A yearly low-dose CT scan of the lungs is recommended for people who:  Currently smoke.  Have quit within the past 15 years.  Have at least a 30-pack-year history of smoking. A pack year is smoking an average of one pack of cigarettes a day for 1 year.  Yearly screening should  continue until it has been 15 years since you quit.  Yearly screening should stop if you develop a health problem that would prevent you from having lung cancer treatment.  Breast Cancer  Practice breast self-awareness. This means understanding how your breasts normally appear and feel.  It also means doing regular breast self-exams. Let your health care provider know about any changes, no matter how small.  If you are in your 20s or 30s, you should have a clinical breast exam (CBE) by a health care provider every 1-3 years as part of a regular health exam.  If you are 89 or older, have a CBE every year. Also consider having a breast X-ray (mammogram) every year.  If you have a family history of breast cancer, talk to your health care provider about genetic screening.  If you are at high risk for breast cancer, talk to your health care provider about having an MRI and a mammogram every year.  Breast cancer gene (BRCA) assessment is recommended for women who have family members with BRCA-related cancers. BRCA-related cancers include:  Breast.  Ovarian.  Tubal.  Peritoneal cancers.  Results of the assessment will determine the need for genetic counseling and BRCA1 and BRCA2 testing. Cervical Cancer Routine pelvic examinations to screen for cervical cancer are no longer recommended for nonpregnant women who are considered low risk for cancer of the pelvic organs (ovaries, uterus, and vagina) and who do not have symptoms. A pelvic examination may be necessary if you have symptoms including those associated with pelvic infections. Ask your health care provider if a screening pelvic exam is right for you.   The Pap test is the screening test for cervical cancer for women who are considered at risk.  If you had a hysterectomy for a problem that was not cancer or a condition that could lead to cancer, then you no longer need Pap tests.  If you are older than 65 years, and you have had  normal Pap tests for the past 10 years, you no longer need to have Pap tests.  If you have had past treatment for cervical cancer or a condition that could lead to cancer, you need Pap tests and screening for cancer for at least 20 years after your treatment.  If you no longer get a Pap test, assess your risk factors if they change (such as having a new sexual partner). This can affect whether you should start being screened again.  Some women have medical problems that increase  their chance of getting cervical cancer. If this is the case for you, your health care provider may recommend more frequent screening and Pap tests.  The human papillomavirus (HPV) test is another test that may be used for cervical cancer screening. The HPV test looks for the virus that can cause cell changes in the cervix. The cells collected during the Pap test can be tested for HPV.  The HPV test can be used to screen women 60 years of age and older. Getting tested for HPV can extend the interval between normal Pap tests from three to five years.  An HPV test also should be used to screen women of any age who have unclear Pap test results.  After 45 years of age, women should have HPV testing as often as Pap tests.  Colorectal Cancer  This type of cancer can be detected and often prevented.  Routine colorectal cancer screening usually begins at 45 years of age and continues through 45 years of age.  Your health care provider may recommend screening at an earlier age if you have risk factors for colon cancer.  Your health care provider may also recommend using home test kits to check for hidden blood in the stool.  A small camera at the end of a tube can be used to examine your colon directly (sigmoidoscopy or colonoscopy). This is done to check for the earliest forms of colorectal cancer.  Routine screening usually begins at age 75.  Direct examination of the colon should be repeated every 5-10 years through  45 years of age. However, you may need to be screened more often if early forms of precancerous polyps or small growths are found. Skin Cancer  Check your skin from head to toe regularly.  Tell your health care provider about any new moles or changes in moles, especially if there is a change in a mole's shape or color.  Also tell your health care provider if you have a mole that is larger than the size of a pencil eraser.  Always use sunscreen. Apply sunscreen liberally and repeatedly throughout the day.  Protect yourself by wearing long sleeves, pants, a wide-brimmed hat, and sunglasses whenever you are outside. HEART DISEASE, DIABETES, AND HIGH BLOOD PRESSURE   Have your blood pressure checked at least every 1-2 years. High blood pressure causes heart disease and increases the risk of stroke.  If you are between 46 years and 58 years old, ask your health care provider if you should take aspirin to prevent strokes.  Have regular diabetes screenings. This involves taking a blood sample to check your fasting blood sugar level.  If you are at a normal weight and have a low risk for diabetes, have this test once every three years after 45 years of age.  If you are overweight and have a high risk for diabetes, consider being tested at a younger age or more often. PREVENTING INFECTION  Hepatitis B  If you have a higher risk for hepatitis B, you should be screened for this virus. You are considered at high risk for hepatitis B if:  You were born in a country where hepatitis B is common. Ask your health care provider which countries are considered high risk.  Your parents were born in a high-risk country, and you have not been immunized against hepatitis B (hepatitis B vaccine).  You have HIV or AIDS.  You use needles to inject street drugs.  You live with someone who has hepatitis B.  You have had sex with someone who has hepatitis B.  You get hemodialysis treatment.  You take  certain medicines for conditions, including cancer, organ transplantation, and autoimmune conditions. Hepatitis C  Blood testing is recommended for:  Everyone born from [birth year] through 1966.  Anyone with known risk factors for hepatitis C. Sexually transmitted infections (STIs)  You should be screened for sexually transmitted infections (STIs) including gonorrhea and chlamydia if:  You are sexually active and are younger than 45 years of age.  You are older than 45 years of age and your health care provider tells you that you are at risk for this type of infection.  Your sexual activity has changed since you were last screened and you are at an increased risk for chlamydia or gonorrhea. Ask your health care provider if you are at risk.  If you do not have HIV, but are at risk, it may be recommended that you take a prescription medicine daily to prevent HIV infection. This is called pre-exposure prophylaxis (PrEP). You are considered at risk if:  You are sexually active and do not regularly use condoms or know the HIV status of your partner(s).  You take drugs by injection.  You are sexually active with a partner who has HIV. Talk with your health care provider about whether you are at high risk of being infected with HIV. If you choose to begin PrEP, you should first be tested for HIV. You should then be tested every 3 months for as long as you are taking PrEP.  PREGNANCY   If you are premenopausal and you may become pregnant, ask your health care provider about preconception counseling.  If you may become pregnant, take 400 to 800 micrograms (mcg) of folic acid every day.  If you want to prevent pregnancy, talk to your health care provider about birth control (contraception). OSTEOPOROSIS AND MENOPAUSE   Osteoporosis is a disease in which the bones lose minerals and strength with aging. This can result in serious bone fractures. Your risk for osteoporosis can be identified using a  bone density scan.  If you are 74 years of age or older, or if you are at risk for osteoporosis and fractures, ask your health care provider if you should be screened.  Ask your health care provider whether you should take a calcium or vitamin D supplement to lower your risk for osteoporosis.  Menopause may have certain physical symptoms and risks.  Hormone replacement therapy may reduce some of these symptoms and risks. Talk to your health care provider about whether hormone replacement therapy is right for you.  HOME CARE INSTRUCTIONS   Schedule regular health, dental, and eye exams.  Stay current with your immunizations.   Do not use any tobacco products including cigarettes, chewing tobacco, or electronic cigarettes.  If you are pregnant, do not drink alcohol.  If you are breastfeeding, limit how much and how often you drink alcohol.  Limit alcohol intake to no more than 1 drink per day for nonpregnant women. One drink equals 12 ounces of beer, 5 ounces of wine, or 1 ounces of hard liquor.  Do not use street drugs.  Do not share needles.  Ask your health care provider for help if you need support or information about quitting drugs.  Tell your health care provider if you often feel depressed.  Tell your health care provider if you have ever been abused or do not feel safe at home. Document Released: 08/11/2010 Document Revised: 06/12/2013  Document Reviewed: 12/28/2012 [facility] Patient Information 2015 [place], [place]. This information is not intended to replace advice given to you by your health care provider. Make sure you discuss any questions you have with your health care provider.

## 2014-08-01 ENCOUNTER — Encounter: Payer: Self-pay | Admitting: Family Medicine

## 2014-08-01 LAB — COMPREHENSIVE METABOLIC PANEL
ALT: 20 IU/L (ref 0–32)
AST: 19 IU/L (ref 0–40)
Albumin/Globulin Ratio: 2 (ref 1.1–2.5)
Albumin: 4.3 g/dL (ref 3.5–5.5)
Alkaline Phosphatase: 55 IU/L (ref 39–117)
BUN/Creatinine Ratio: 23 (ref 9–23)
BUN: 19 mg/dL (ref 6–24)
Bilirubin Total: <0.2 mg/dL (ref 0.0–1.2)
CO2: 25 mmol/L (ref 18–29)
Calcium: 9.3 mg/dL (ref 8.7–10.2)
Chloride: 100 mmol/L (ref 97–108)
Creatinine, Ser: 0.82 mg/dL (ref 0.57–1.00)
GFR calc Af Amer: 100 mL/min/{1.73_m2} (ref >59)
GFR calc non Af Amer: 87 mL/min/{1.73_m2} (ref >59)
Globulin, Total: 2.2 g/dL (ref 1.5–4.5)
Glucose: 75 mg/dL (ref 65–99)
Potassium: 4.2 mmol/L (ref 3.5–5.2)
Sodium: 138 mmol/L (ref 134–144)
Total Protein: 6.5 g/dL (ref 6.0–8.5)

## 2014-08-01 LAB — UA/M W/RFLX CULTURE, ROUTINE
Bilirubin, UA: NEGATIVE
Glucose, UA: NEGATIVE
Ketones, UA: NEGATIVE
Leukocytes, UA: NEGATIVE
Nitrite, UA: NEGATIVE
Protein, UA: NEGATIVE
RBC, UA: NEGATIVE
Specific Gravity, UA: 1.02 (ref 1.005–1.030)
Urobilinogen, Ur: 0.2 mg/dL (ref 0.2–1.0)
pH, UA: 5.5 (ref 5.0–7.5)

## 2014-08-01 LAB — LIPID PANEL PICCOLO, WAIVED
Chol/HDL Ratio Piccolo,Waive: 4.9 mg/dL
Cholesterol Piccolo, Waived: 162 mg/dL (ref <200)
HDL Chol Piccolo, Waived: 33 mg/dL — ABNORMAL LOW (ref >59)
LDL Chol Calc Piccolo Waived: 77 mg/dL (ref <100)
Triglycerides Piccolo,Waived: 260 mg/dL — ABNORMAL HIGH (ref <150)
VLDL Chol Calc Piccolo,Waive: 52 mg/dL — ABNORMAL HIGH (ref <30)

## 2014-08-01 LAB — CBC WITH DIFFERENTIAL/PLATELET
Hematocrit: 40.5 % (ref 34.0–46.6)
Hemoglobin: 13.7 g/dL (ref 11.1–15.9)
Lymphocytes Absolute: 2 10*3/uL (ref 0.7–3.1)
Lymphs: 31 %
MCH: 29.6 pg (ref 26.6–33.0)
MCHC: 33.8 g/dL (ref 31.5–35.7)
MCV: 88 fL (ref 79–97)
MID (Absolute): 0.8 10*3/uL (ref 0.1–1.6)
MID: 12 %
Neutrophils Absolute: 3.8 10*3/uL (ref 1.4–7.0)
Neutrophils: 57 %
Platelets: 241 10*3/uL (ref 150–379)
RBC: 4.63 x10E6/uL (ref 3.77–5.28)
RDW: 13.9 % (ref 12.3–15.4)
WBC: 6.6 10*3/uL (ref 3.4–10.8)

## 2014-08-01 LAB — TSH: TSH: 3.51 u[IU]/mL (ref 0.450–4.500)

## 2014-08-01 LAB — VITAMIN D 25 HYDROXY (VIT D DEFICIENCY, FRACTURES): Vit D, 25-Hydroxy: 23 ng/mL — ABNORMAL LOW (ref 30.0–100.0)

## 2014-08-07 ENCOUNTER — Ambulatory Visit: Payer: Self-pay | Admitting: Family Medicine

## 2014-08-09 ENCOUNTER — Telehealth: Payer: Self-pay | Admitting: Family Medicine

## 2014-08-09 MED ORDER — NICOTINE 14 MG/24HR TD PT24: 14.0000 mg | MEDICATED_PATCH | Freq: Every day | TRANSDERMAL | Status: DC

## 2014-08-09 NOTE — Telephone Encounter (Signed)
Pt called stated she believes she needs to be increased to Step 2 14mg  of the smoking transdermal patch. [facility] is [facility] in [place].

## 2014-08-09 NOTE — Telephone Encounter (Signed)
Routing to correct provider

## 2014-08-09 NOTE — Telephone Encounter (Signed)
Rx sent to her pharmacy 

## 2014-08-21 ENCOUNTER — Ambulatory Visit: Payer: Medicaid Other | Admitting: Psychiatry

## 2014-08-21 ENCOUNTER — Other Ambulatory Visit: Payer: Self-pay

## 2014-08-21 DIAGNOSIS — Z8659 Personal history of other mental and behavioral disorders: Secondary | ICD-10-CM | POA: Insufficient documentation

## 2014-08-21 DIAGNOSIS — F331 Major depressive disorder, recurrent, moderate: Secondary | ICD-10-CM | POA: Insufficient documentation

## 2014-08-22 ENCOUNTER — Telehealth: Payer: Self-pay

## 2014-08-22 DIAGNOSIS — R062 Wheezing: Secondary | ICD-10-CM

## 2014-08-22 NOTE — Telephone Encounter (Signed)
Becky Hubbard   Patient needs a nebulizer machine, she does not have one. She has the medication already.

## 2014-08-23 NOTE — Telephone Encounter (Signed)
Called and notified the patient that no one in [place] except [place], which has had a recent change in ownership so at this time they can not bill Medicaid. I informed her that she could pay cash which is 46.89 or that she would pick up a hard copy of the prescription and call her insurance to see if they know who will provide it.

## 2014-08-23 NOTE — Telephone Encounter (Signed)
OK to call in. Can't send order through the computer. Ordered above.

## 2014-09-04 DIAGNOSIS — R7303 Prediabetes: Secondary | ICD-10-CM | POA: Insufficient documentation

## 2014-09-05 ENCOUNTER — Encounter: Payer: Self-pay | Admitting: Psychiatry

## 2014-09-05 ENCOUNTER — Ambulatory Visit (INDEPENDENT_AMBULATORY_CARE_PROVIDER_SITE_OTHER): Payer: Medicaid Other | Admitting: Psychiatry

## 2014-09-05 VITALS — BP 124/76 | HR 114 | Temp 98.5°F | Ht 64.0 in | Wt 172.6 lb

## 2014-09-05 DIAGNOSIS — F4310 Post-traumatic stress disorder, unspecified: Secondary | ICD-10-CM

## 2014-09-05 DIAGNOSIS — F331 Major depressive disorder, recurrent, moderate: Secondary | ICD-10-CM | POA: Diagnosis not present

## 2014-09-05 MED ORDER — DULOXETINE HCL 30 MG PO CPEP: 30.0000 mg | ORAL_CAPSULE | Freq: Every day | ORAL | Status: DC

## 2014-09-05 MED ORDER — CLONAZEPAM 0.5 MG PO TABS: 0.5000 mg | ORAL_TABLET | Freq: Three times a day (TID) | ORAL | Status: DC | PRN

## 2014-09-05 MED ORDER — LURASIDONE HCL 20 MG PO TABS: 20.0000 mg | ORAL_TABLET | Freq: Every day | ORAL | Status: DC

## 2014-09-05 NOTE — Progress Notes (Signed)
[place] MD/PA/NP OP Progress Note  09/05/2014 3:32 PM Becky Hubbard  MRN:  [id number]   Subjective:  Patient returns for follow-up of her major depressive disorder, moderate recurrent and PTSD. She states that the main stressor for her right now is dealing with her mother who she reports has worsening dementia and irritability. Patient states this causes her a lot of stress and is exacerbating her PTSD. Patient did make some changes in her medication in between visits. She states she stopped taking the citalopram and restarted her Cymbalta because she felt that the Cymbalta was more helpful for her fibromyalgia. Patient also states that she instead of taking Seroquel 100 mg at bedtime she went to 50 because she was having some morning grogginess. She states otherwise she continues to see her trauma therapist and continues to exercise and this helps her deal with stress. She has hope things when he get better. She asked about taking Latuda because she had been on it in the past and states that this helped her PTSD related voices.  Chief Complaint:  Chief Complaint    Follow-up; Medication Refill; Anxiety; Depression; Panic Attack; Stress     Visit Diagnosis:  No diagnosis found.  Past Medical History:  Past Medical History  Diagnosis Date  . GERD (gastroesophageal reflux disease)   . Depression   . Arthritis   . Plaque psoriasis   . Other specified disorder of skin     neurological skin disorder-breaks out when nervous  . Ulcer   . Anxiety   . Cervical spine fracture 2008    Closed with screw C2-3 [facility] Dr. Delilah Shan  . Foot fracture, left   . Pituitary mass MRI 2015    NOT seen on exam 04/10/14  . Auditory hallucinations   . Herniated cervical disc   . PTSD (post-traumatic stress disorder)   . ADD (attention deficit disorder)   . OCD (obsessive compulsive disorder)   . Degenerative joint disease of spine   . Neuromuscular disorder     fibromyalgia/chronic fatique syndrome  . Carpal tunnel  syndrome on both sides   . Neuropathy of foot   . Gastroparesis   . Hearing loss     Past Surgical History  Procedure Laterality Date  . Spine surgery      cervical  . Nasal septum surgery    . Fracture surgery    . Occipital nerve blocks    . Cervical radiofrequency neurotomy    . Mouth surgery      upper and lower dentures   Family History:  Family History  Problem Relation Age of Onset  . Cancer Mother 68    breast cancer  . Migraines Mother   . Hypertension Mother   . Arthritis Mother   . Sleep apnea Mother   . Anxiety disorder Mother   . Depression Mother   . Diabetes Father   . Cancer Father 3    of spine , pancreas and liver  . Anxiety disorder Father   . Depression Father   . Diabetes Brother   . Hyperlipidemia Brother   . Sleep apnea Brother   . Cancer Paternal Aunt     breast  . Cancer Maternal Grandfather     leukemia, prostate  . Cancer Paternal Grandfather     bone  . Cancer Maternal Aunt     breast  . Mental illness Maternal Grandmother     Dementia  . Stroke Paternal Grandmother   . Diabetes Brother  Social History:  History   Social History  . Marital Status: Unknown    Spouse Name: N/A  . Number of Children: N/A  . Years of Education: N/A   Social History Main Topics  . Smoking status: Current Every Day Smoker -- 0.30 packs/day for 12 years    Types: Cigarettes    Start date: 03/01/2011  . Smokeless tobacco: Never Used  . Alcohol Use: No  . Drug Use: No  . Sexual Activity: No   Other Topics Concern  . None   Social History Narrative   Additional History:   Assessment:   Musculoskeletal: Strength & Muscle Tone: within normal limits Gait & Station: normal Patient leans: N/A  Psychiatric Specialty Exam: HPI  Review of Systems  Psychiatric/Behavioral: Positive for hallucinations. Negative for depression, suicidal ideas, memory loss and substance abuse. The patient is nervous/anxious and has insomnia.     Blood  pressure 124/76, pulse 114, temperature 98.5 F (36.9 C), temperature source Tympanic, height 5\' 4"  (1.626 m), weight 172 lb 9.6 oz (78.291 kg), last menstrual period 08/29/2014, SpO2 95 %.Body mass index is 29.61 kg/(m^2).  General Appearance: Neat and Well Groomed  Eye Contact:  Good  Speech:  Normal Rate  Volume:  Normal  Mood:  Could be better  Affect:  Constricted  Thought Process:  Linear and Logical  Orientation:  Full (Time, Place, and Person)  Thought Content:  Negative  Suicidal Thoughts:  No  Homicidal Thoughts:  No  Memory:  Immediate;   Good Recent;   Good Remote;   Good  Judgement:  Good  Insight:  Fair  Psychomotor Activity:  Negative  Concentration:  Good  Recall:  Good  Fund of Knowledge: Good  Language: Good  Akathisia:  Negative  Handed:  Right unknown   AIMS (if indicated):  Done today, normal  Assets:  Communication Skills Desire for Improvement  ADL's:  Intact  Cognition: WNL  Sleep:  fair   Is the patient at risk to self?  No. Has the patient been a risk to self in the past 6 months?  No. Has the patient been a risk to self within the distant past?  Yes.   1998  Is the patient a risk to others?  No. Has the patient been a risk to others in the past 6 months?  No. Has the patient been a risk to others within the distant past?  No.  Current Medications: Current Outpatient Prescriptions  Medication Sig Dispense Refill  . albuterol (PROVENTIL) (2.5 MG/3ML) 0.083% nebulizer solution Take 3 mLs (2.5 mg total) by nebulization every 6 (six) hours as needed for wheezing or shortness of breath. 150 mL 1  . Alum & Mag Hydroxide-Simeth (GI COCKTAIL) SUSP suspension Take 30 mLs by mouth 2 (two) times daily. Shake well.    . Ascorbic Acid (VITAMIN C) 1000 MG tablet Take 1,000 mg by mouth.    Marland Kitchen aspirin EC 81 MG tablet Take 81 mg by mouth daily.    . betamethasone valerate ointment (VALISONE) 0.1 % Apply topically.    . Cholecalciferol (VITAMIN D3) 1000 UNITS  CAPS Take by mouth.    . citalopram (CELEXA) 20 MG tablet Take by mouth.    . clonazePAM (KLONOPIN) 0.5 MG tablet Take 1 tablet (0.5 mg total) by mouth 3 (three) times daily as needed. 90 tablet 1  . cyclobenzaprine (FLEXERIL) 10 MG tablet TAKE 2 TABLETS BY MOUTH AT BEDTIME AND 1 TABLET ONCE DAILY AS NEEDED *MUST LAST 30  DAYS* 90 tablet 3  . diazepam (VALIUM) 10 MG tablet Take by mouth.    . DULoxetine (CYMBALTA) 30 MG capsule Take 1 capsule (30 mg total) by mouth daily. 30 capsule 2  . fluticasone (FLONASE) 50 MCG/ACT nasal spray Place into the nose.    . gabapentin (NEURONTIN) 100 MG capsule Take by mouth.    . mupirocin ointment (BACTROBAN) 2 % Apply daily to open wounds until clear.    . nicotine (NICODERM CQ - DOSED IN MG/24 HOURS) 14 mg/24hr patch Place 1 patch (14 mg total) onto the skin daily. 28 patch 0  . nicotine (NICODERM CQ - DOSED IN MG/24 HR) 7 mg/24hr patch Place 1 patch (7 mg total) onto the skin daily. 28 patch 0  . nicotine polacrilex (COMMIT) 2 MG lozenge 2 mg.    . Omega-3 Fatty Acids (FISH OIL) 1000 MG CAPS Take by mouth.    . OMEPRAZOLE PO Take 1 capsule by mouth 4 (four) times daily.    . QUEtiapine (SEROQUEL XR) 50 MG TB24 24 hr tablet Take 50 mg by mouth at bedtime.    . Scar Treatment Products ([place]) GEL Frequency:BID   Dosage:0.0     Instructions:  Note:Dose: SCAR    . SM MULTIPLE VITAMINS/IRON TABS Take by mouth.    . sucralfate (CARAFATE) 1 G tablet Take 1 g by mouth 4 (four) times daily.    Marland Kitchen topiramate (TOPAMAX) 25 MG tablet TAKE  ONE TABLET BY MOUTH NIGHTLY AT BEDTIME 30 tablet 2  . traMADol (ULTRAM) 50 MG tablet Take 1 tablet (50 mg total) by mouth 4 (four) times daily. 120 tablet 5  . varenicline (CHANTIX STARTING MONTH PAK) 0.5 MG X 11 & 1 MG X 42 tablet Take by mouth.    . Lurasidone HCl (LATUDA) 20 MG TABS Take 1 tablet (20 mg total) by mouth daily. Take with food. 30 tablet 2   No current facility-administered medications for this visit.    Medical  Decision Making:  Established Problem, Stable/Improving (1) and Review of Medication Regimen & Side Effects (2)  Treatment Plan Summary:Medication management and Plan Patient is artery restarted her Cymbalta 30 mg daily for [place] will continue with this medication as she feels it provides benefit for her fibromyalgia. We will discontinue her Seroquel and restart some Latuda which she states she has arty been on in the past. We'll start Latuda 20 mg daily with food. We will continue the patient's clonazepam 0.5 mg 3 times a day as needed. Patient will follow up in 1 month. She continues to see a trauma therapist.   Faith Rogue 09/05/2014, 3:32 PM

## 2014-09-11 ENCOUNTER — Telehealth: Payer: Self-pay

## 2014-09-11 NOTE — Telephone Encounter (Signed)
received prior auth spoke with rob.  auth # [id number] id# [id number] prior auth good for 09-11-14 to 09-06-15.

## 2014-09-11 NOTE — Telephone Encounter (Signed)
Becky Hubbard called from [employer]. pt needs prior auth on latuda. pt has mediciad id # [id number] Q call [phone]

## 2014-09-13 ENCOUNTER — Telehealth: Payer: Self-pay

## 2014-09-13 NOTE — Telephone Encounter (Signed)
Patient no showed her appointment with them today.

## 2014-10-09 ENCOUNTER — Ambulatory Visit: Payer: Medicaid Other | Admitting: Psychiatry

## 2014-11-06 ENCOUNTER — Ambulatory Visit (INDEPENDENT_AMBULATORY_CARE_PROVIDER_SITE_OTHER): Payer: Medicaid Other | Admitting: Psychiatry

## 2014-11-06 ENCOUNTER — Encounter: Payer: Self-pay | Admitting: Psychiatry

## 2014-11-06 VITALS — BP 118/84 | HR 110 | Temp 98.3°F | Ht 65.0 in | Wt 170.4 lb

## 2014-11-06 DIAGNOSIS — F4310 Post-traumatic stress disorder, unspecified: Secondary | ICD-10-CM

## 2014-11-06 DIAGNOSIS — F331 Major depressive disorder, recurrent, moderate: Secondary | ICD-10-CM

## 2014-11-06 MED ORDER — LURASIDONE HCL 40 MG PO TABS: 40.0000 mg | ORAL_TABLET | Freq: Every day | ORAL | Status: DC

## 2014-11-06 MED ORDER — CLONAZEPAM 0.5 MG PO TABS: 0.5000 mg | ORAL_TABLET | Freq: Three times a day (TID) | ORAL | Status: DC | PRN

## 2014-11-06 MED ORDER — DULOXETINE HCL 30 MG PO CPEP: 30.0000 mg | ORAL_CAPSULE | Freq: Every day | ORAL | Status: DC

## 2014-11-06 MED ORDER — ESZOPICLONE 1 MG PO TABS: 1.0000 mg | ORAL_TABLET | Freq: Every evening | ORAL | Status: DC | PRN

## 2014-11-06 NOTE — Progress Notes (Signed)
[facility] MD/PA/NP OP Progress Note  11/06/2014 9:08 AM Becky Hubbard  MRN:  [id number]   Subjective:  Patient returns for follow-up of her major depressive disorder, moderate recurrent and PTSD. Dated that the biggest issue for her right now is dealing with moving. She is moving to a house next door. The aunt that she live with is now moving to [place]. She states this is a good thing because she had some conflict with the.. She states that the [place] that was added in the last visit she has not noticed a difference but she states she did feel it helped her in the past with her mood and PTSD. However she's not able to recall the dose she was on in the past. I did spend some time talking with her about the tension interaction between [place] and her GI cocktail (his aluminum hydroxide simethicone). She stated she could stop that GI medicine. She'll continue her Cymbalta, clonazepam with good effect. She states she does miss the Seroquel because it helped her sleep.  She continues to practice the piano about once a day for an hour. She states she hopes to build up her skills while enough to play at church. She continues to see her trauma therapist.  I discussed with her the issues of serotonin syndrome and use of tramadol with Cymbalta. She indicated she might use tramadol about once a day. Chief Complaint:  Chief Complaint    Follow-up; Medication Refill; Anxiety; Depression; Stress     Visit Diagnosis:  No diagnosis found.  Past Medical History:  Past Medical History  Diagnosis Date  . GERD (gastroesophageal reflux disease)   . Depression   . Arthritis   . Plaque psoriasis   . Other specified disorder of skin     neurological skin disorder-breaks out when nervous  . Ulcer   . Anxiety   . Cervical spine fracture 2008    Closed with screw C2-3 [facility] Delilah Shan  . Foot fracture, left   . Pituitary mass MRI 2015    NOT seen on exam 04/10/14  . Auditory hallucinations   . Herniated cervical disc    . PTSD (post-traumatic stress disorder)   . ADD (attention deficit disorder)   . OCD (obsessive compulsive disorder)   . Degenerative joint disease of spine   . Neuromuscular disorder     fibromyalgia/chronic fatique syndrome  . Carpal tunnel syndrome on both sides   . Neuropathy of foot   . Gastroparesis   . Hearing loss     Past Surgical History  Procedure Laterality Date  . Spine surgery      cervical  . Nasal septum surgery    . Fracture surgery    . Occipital nerve blocks    . Cervical radiofrequency neurotomy    . Mouth surgery      upper and lower dentures   Family History:  Family History  Problem Relation Age of Onset  . Cancer Mother 55    breast cancer  . Migraines Mother   . Hypertension Mother   . Arthritis Mother   . Sleep apnea Mother   . Anxiety disorder Mother   . Depression Mother   . Diabetes Father   . Cancer Father 47    of spine , pancreas and liver  . Anxiety disorder Father   . Depression Father   . Diabetes Brother   . Hyperlipidemia Brother   . Sleep apnea Brother   . Cancer Paternal Aunt  breast  . Cancer Maternal Grandfather     leukemia, prostate  . Cancer Paternal Grandfather     bone  . Cancer Maternal Aunt     breast  . Mental illness Maternal Grandmother     Dementia  . Stroke Paternal Grandmother   . Diabetes Brother    Social History:  Social History   Social History  . Marital Status: Unknown    Spouse Name: N/A  . Number of Children: N/A  . Years of Education: N/A   Social History Main Topics  . Smoking status: Current Every Day Smoker -- 0.30 packs/day for 12 years    Types: Cigarettes    Start date: 03/01/2011  . Smokeless tobacco: Never Used  . Alcohol Use: No  . Drug Use: No  . Sexual Activity: No   Other Topics Concern  . None   Social History Narrative   Additional History:   Assessment:   Musculoskeletal: Strength & Muscle Tone: within normal limits Gait & Station: normal Patient  leans: N/A  Psychiatric Specialty Exam: Anxiety Symptoms include insomnia and nervous/anxious behavior. Patient reports no suicidal ideas.    Depression        Associated symptoms include insomnia.  Associated symptoms include no suicidal ideas.  Past medical history includes anxiety.     Review of Systems  Psychiatric/Behavioral: Positive for hallucinations. Negative for depression, suicidal ideas, memory loss and substance abuse. The patient is nervous/anxious and has insomnia.     Blood pressure 118/84, pulse 110, temperature 98.3 F (36.8 C), temperature source Tympanic, height 5\' 5"  (1.651 m), weight 170 lb 6.4 oz (77.293 kg), last menstrual period 10/30/2014, SpO2 95 %.Body mass index is 28.36 kg/(m^2).  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  No. Has the patient been a risk to self in the past 6 months?  No. Has the patient been a risk to self within the distant past?  Yes.   1998  Is the patient a risk to others?  No. Has the patient been a risk to others in the past 6 months?  No. Has the patient been a risk to others within the distant past?  No.  Current Medications: Current Outpatient Prescriptions  Medication Sig Dispense Refill  . albuterol (PROVENTIL) (2.5 MG/3ML) 0.083%  nebulizer solution Take 3 mLs (2.5 mg total) by nebulization every 6 (six) hours as needed for wheezing or shortness of breath. 150 mL 1  . Ascorbic Acid (VITAMIN C) 1000 MG tablet Take 1,000 mg by mouth.    Marland Kitchen aspirin EC 81 MG tablet Take 81 mg by mouth daily.    . betamethasone valerate ointment (VALISONE) 0.1 % Apply  topically.    . Cholecalciferol (VITAMIN D3) 1000 UNITS CAPS Take by mouth.    . citalopram (CELEXA) 20 MG tablet Take by mouth.    . clonazePAM (KLONOPIN) 0.5 MG tablet Take 1 tablet (0.5 mg total) by mouth 3 (three) times daily as needed. 90 tablet 1  . cyclobenzaprine (FLEXERIL) 10 MG tablet TAKE 2 TABLETS BY MOUTH AT BEDTIME AND 1 TABLET ONCE DAILY AS NEEDED *MUST LAST 30 DAYS* 90 tablet 3  . DULoxetine (CYMBALTA) 30 MG capsule Take 1 capsule (30 mg total) by mouth daily. 30 capsule 3  . fluticasone (FLONASE) 50 MCG/ACT nasal spray Place into the nose.    . gabapentin (NEURONTIN) 100 MG capsule Take by mouth.    . mupirocin ointment (BACTROBAN) 2 % Apply daily to open wounds until clear.    . nicotine (NICODERM CQ - DOSED IN MG/24 HOURS) 14 mg/24hr patch Place 1 patch (14 mg total) onto the skin daily. 28 patch 0  . nicotine (NICODERM CQ - DOSED IN MG/24 HR) 7 mg/24hr patch Place 1 patch (7 mg total) onto the skin daily. 28 patch 0  . nicotine polacrilex (COMMIT) 2 MG lozenge 2 mg.    . Omega-3 Fatty Acids (FISH OIL) 1000 MG CAPS Take by mouth.    . OMEPRAZOLE PO Take 1 capsule by mouth 4 (four) times daily.    . Scar Treatment Products ([place]) GEL Frequency:BID   Dosage:0.0     Instructions:  Note:Dose: SCAR    . SM MULTIPLE VITAMINS/IRON TABS Take by mouth.    . sucralfate (CARAFATE) 1 G tablet Take 1 g by mouth 4 (four) times daily.    Marland Kitchen topiramate (TOPAMAX) 25 MG tablet TAKE  ONE TABLET BY MOUTH NIGHTLY AT BEDTIME 30 tablet 2  . traMADol (ULTRAM) 50 MG tablet Take 1 tablet (50 mg total) by mouth 4 (four) times daily. 120 tablet 5  . varenicline (CHANTIX STARTING MONTH  PAK) 0.5 MG X 11 & 1 MG X 42 tablet Take by mouth.    . eszopiclone (LUNESTA) 1 MG TABS tablet Take 1 tablet (1 mg total) by mouth at bedtime as needed for sleep. Take immediately before bedtime 30 tablet 1  . lurasidone (LATUDA) 40 MG TABS tablet Take 1 tablet (40 mg total) by mouth daily with breakfast. 30 tablet 2   No current facility-administered medications for this visit.    Medical Decision Making:  Established Problem, Stable/Improving (1) and Review of Medication Regimen & Side Effects (2)  Treatment Plan Summary:Medication management and Plan   Major Depressive Disorder- is artery restarted her Cymbalta 30 mg daily for [place] will continue with this medication as she feels it provides benefit for her fibromyalgia. We will increase  Latuda from 20 mg to 40 mg.   PTSD- Cymbalta as above. We will continue the patient's clonazepam 0.5 mg 3 times a day as needed. We will start Lunesta 1 mg at bedtime as needed. Risk and benefits of been discussed patient's able to consent. Patient will follow up in 1 month. She continues to see a trauma therapist.   Faith Rogue 11/06/2014, 9:08 AM

## 2014-11-28 ENCOUNTER — Ambulatory Visit: Payer: Medicaid Other | Admitting: Psychiatry

## 2014-12-11 ENCOUNTER — Telehealth: Payer: Self-pay

## 2014-12-11 NOTE — Telephone Encounter (Signed)
Received a voicemail from the patient that she needs a referral to [facility] again to be seen for a Female CPE, received a verbal order from [facility].

## 2014-12-20 ENCOUNTER — Ambulatory Visit: Payer: Medicaid Other | Admitting: Psychiatry

## 2014-12-21 ENCOUNTER — Ambulatory Visit: Payer: Medicaid Other | Admitting: Psychiatry

## 2014-12-24 ENCOUNTER — Other Ambulatory Visit: Payer: Self-pay

## 2014-12-24 MED ORDER — FLUTICASONE PROPIONATE 50 MCG/ACT NA SUSP: 2.0000 | Freq: Every day | NASAL | Status: DC

## 2014-12-24 NOTE — Telephone Encounter (Signed)
LAST VISIT: 07/31/2014 UPCOMING APPT: 01/31/2015  Request for Flonase 50 mcg/act. Noted by pharmacy: "She is a new patient. Her former pharmacy could not transfer to [place]. No refills/expired. Please send new rx to [place]"

## 2014-12-26 ENCOUNTER — Telehealth: Payer: Self-pay

## 2014-12-26 NOTE — Telephone Encounter (Signed)
Cheryl refilled patient's flonase prescription but she still had her pharmacy listed as [facility], who is closed now. So I called the patient to see what pharmacy is was now using. Patient stated she is using [facility] now so I faxed the prescription to them.

## 2015-01-08 ENCOUNTER — Ambulatory Visit: Payer: Medicaid Other | Admitting: Psychiatry

## 2015-01-09 ENCOUNTER — Other Ambulatory Visit: Payer: Self-pay

## 2015-01-09 MED ORDER — CLONAZEPAM 0.5 MG PO TABS: 0.5000 mg | ORAL_TABLET | Freq: Three times a day (TID) | ORAL | Status: DC | PRN

## 2015-01-09 NOTE — Telephone Encounter (Signed)
rx for klonopin .5mg  was faxed - confirmed.  rx id # [id number] order # [id number]. pt was only given enought to do until her next appt

## 2015-01-09 NOTE — Telephone Encounter (Signed)
received fax requesting refill for clonazepam,  pt has canceled and r/s appt several time pt was told yesterday when she call about her appt that it would be r/s one more time but if she does not show up for her appt on 01-17-15 she would be dismissed.   Pt will just received enough medications to do until her next appt .

## 2015-01-09 NOTE — Telephone Encounter (Signed)
Pt was given enough medication to due until her next appt in dec.

## 2015-01-17 ENCOUNTER — Ambulatory Visit (INDEPENDENT_AMBULATORY_CARE_PROVIDER_SITE_OTHER): Payer: Medicaid Other | Admitting: Psychiatry

## 2015-01-17 ENCOUNTER — Encounter: Payer: Self-pay | Admitting: Psychiatry

## 2015-01-17 VITALS — BP 122/78 | HR 124 | Temp 98.3°F | Ht 65.0 in | Wt 168.0 lb

## 2015-01-17 DIAGNOSIS — F331 Major depressive disorder, recurrent, moderate: Secondary | ICD-10-CM

## 2015-01-17 DIAGNOSIS — F429 Obsessive-compulsive disorder, unspecified: Secondary | ICD-10-CM | POA: Insufficient documentation

## 2015-01-17 DIAGNOSIS — F4310 Post-traumatic stress disorder, unspecified: Secondary | ICD-10-CM

## 2015-01-17 MED ORDER — CLONAZEPAM 1 MG PO TABS: ORAL_TABLET | ORAL | Status: DC

## 2015-01-17 MED ORDER — ESZOPICLONE 1 MG PO TABS: 1.0000 mg | ORAL_TABLET | Freq: Every evening | ORAL | Status: DC | PRN

## 2015-01-17 MED ORDER — LURASIDONE HCL 40 MG PO TABS: 40.0000 mg | ORAL_TABLET | Freq: Every day | ORAL | Status: DC

## 2015-01-17 MED ORDER — DULOXETINE HCL 30 MG PO CPEP: 30.0000 mg | ORAL_CAPSULE | Freq: Every day | ORAL | Status: DC

## 2015-01-17 NOTE — Progress Notes (Signed)
BH MD/PA/NP OP Progress Note  01/17/2015 2:30 PM Becky Hubbard  MRN:  [id number]   Subjective:  Patient returns for follow-up of her major depressive disorder, moderate recurrent and PTSD. She overall indicates things are going fairly well. She does state that she'll like the Cymbalta has been helpful for her depression. She feels like Anette Guarneri has been helpful for her PTSD auditory hallucinations. It's her biggest issue right now is perhaps some increased anxiety as well as insomnia. She never got the Lunesta at the last appointment. Although I see and written in the system she may not have received a hard copy prescription for this.  States she plans to get ready for the holidays and looks forward to them. Chief Complaint:  Chief Complaint    Follow-up; Medication Refill     Visit Diagnosis:     ICD-9-CM ICD-10-CM   1. PTSD (post-traumatic stress disorder) 309.81 F43.10   2. Major depressive disorder, recurrent episode, moderate (HCC) 296.32 F33.1     Past Medical History:  Past Medical History  Diagnosis Date  . GERD (gastroesophageal reflux disease)   . Depression   . Arthritis   . Plaque psoriasis   . Other specified disorder of skin     neurological skin disorder-breaks out when nervous  . Ulcer   . Anxiety   . Cervical spine fracture ([place]) 2008    Closed with screw C2-3 [facility] Dr. Delilah Shan  . Foot fracture, left   . 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 Auditory hallucinations   . Herniated cervical disc   . PTSD (post-traumatic stress disorder)   . ADD (attention deficit disorder)   . OCD (obsessive compulsive disorder)   . Degenerative joint disease of spine   . Neuromuscular disorder (HCC)     fibromyalgia/chronic fatique syndrome  . Carpal tunnel syndrome on both sides   . Neuropathy of foot   . Gastroparesis   . Hearing loss     Past Surgical History  Procedure Laterality Date  . Spine surgery      cervical  . Nasal septum surgery    .  Fracture surgery    . Occipital nerve blocks    . Cervical radiofrequency neurotomy    . Mouth surgery      upper and lower dentures   Family History:  Family History  Problem Relation Age of Onset  . Cancer Mother 12    breast cancer  . Migraines Mother   . Hypertension Mother   . Arthritis Mother   . Sleep apnea Mother   . Anxiety disorder Mother   . Depression Mother   . Diabetes Father   . Cancer Father 19    of spine , pancreas and liver  . Anxiety disorder Father   . Depression Father   . Diabetes Brother   . Hyperlipidemia Brother   . Sleep apnea Brother   . Cancer Paternal Aunt     breast  . Cancer Maternal Grandfather     leukemia, prostate  . Cancer Paternal Grandfather     bone  . Cancer Maternal Aunt     breast  . Mental illness Maternal Grandmother     Dementia  . Stroke Paternal Grandmother   . Diabetes Brother    Social History:  Social History   Social History  . Marital Status: Unknown    Spouse Name: N/A  . Number of Children: N/A  . Years of Education:  N/A   Social History Main Topics  . Smoking status: Current Every Day Smoker -- 0.30 packs/day for 12 years    Types: Cigarettes    Start date: 03/01/2011  . Smokeless tobacco: Never Used  . Alcohol Use: No  . Drug Use: No  . Sexual Activity: No   Other Topics Concern  . None   Social History Narrative   Additional History:   Assessment:   Musculoskeletal: Strength & Muscle Tone: within normal limits Gait & Station: normal Patient leans: N/A  Psychiatric Specialty Exam: Anxiety Symptoms include insomnia and nervous/anxious behavior. Patient reports no suicidal ideas.    Depression        Associated symptoms include insomnia.  Associated symptoms include no suicidal ideas.  Past medical history includes anxiety.     Review of Systems  Psychiatric/Behavioral: Positive for hallucinations. Negative for depression, suicidal ideas, memory loss and substance abuse. The patient  is nervous/anxious and has insomnia.   All other systems reviewed and are negative.   Blood pressure 122/78, pulse 124, temperature 98.3 F (36.8 C), temperature source Tympanic, height 5\' 5"  (1.651 m), weight 168 lb (76.204 kg), last menstrual period 01/17/2015, SpO2 99 %.Body mass index is 27.96 kg/(m^2).  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  No. Has the patient been a risk to self in the past 6 months?  No. Has the patient been a risk to self within the distant past?  Yes.   1998  Is the patient a risk to others?  No. Has the patient been a risk to others in the past 6 months?  No. Has the patient been a risk to others within the distant past?  No.  Current Medications: Current Outpatient Prescriptions  Medication Sig Dispense Refill  . albuterol (PROVENTIL) (2.5 MG/3ML) 0.083% nebulizer solution Take 3 mLs (2.5 mg total) by nebulization every 6 (six) hours as needed for wheezing or shortness of breath. 150 mL 1  . Ascorbic Acid (VITAMIN C) 1000 MG tablet Take 1,000 mg by mouth.    Marland Kitchen aspirin EC 81 MG tablet Take 81 mg by mouth daily.    . betamethasone valerate ointment (VALISONE) 0.1 % Apply topically.    . Cholecalciferol (VITAMIN D3) 1000 UNITS CAPS Take by mouth.    . clonazePAM  (KLONOPIN) 1 MG tablet One whole tablet twice daily as needed for anxiety and one half a tablet at bedtime as needed for anxiety. 75 tablet 4  . cyclobenzaprine (FLEXERIL) 10 MG tablet TAKE 2 TABLETS BY MOUTH AT BEDTIME AND 1 TABLET ONCE DAILY AS NEEDED *MUST LAST 30 DAYS* 90 tablet 3  . DULoxetine (CYMBALTA) 30 MG capsule Take  1 capsule (30 mg total) by mouth daily. 30 capsule 4  . eszopiclone (LUNESTA) 1 MG TABS tablet Take 1 tablet (1 mg total) by mouth at bedtime as needed for sleep. Take immediately before bedtime 30 tablet 4  . fluticasone (FLONASE) 50 MCG/ACT nasal spray Place 2 sprays into both nostrils daily. 3 g inh  . gabapentin (NEURONTIN) 100 MG capsule Take by mouth.    . lurasidone (LATUDA) 40 MG TABS tablet Take 1 tablet (40 mg total) by mouth daily with breakfast. 30 tablet 4  . mupirocin ointment (BACTROBAN) 2 % Apply daily to open wounds until clear.    . nicotine (NICODERM CQ - DOSED IN MG/24 HOURS) 14 mg/24hr patch Place 1 patch (14 mg total) onto the skin daily. 28 patch 0  . nicotine (NICODERM CQ - DOSED IN MG/24 HR) 7 mg/24hr patch Place 1 patch (7 mg total) onto the skin daily. 28 patch 0  . nicotine polacrilex (COMMIT) 2 MG lozenge 2 mg.    . Omega-3 Fatty Acids (FISH OIL) 1000 MG CAPS Take by mouth.    . OMEPRAZOLE PO Take 1 capsule by mouth 4 (four) times daily.    . Scar Treatment Products ([place]) GEL Frequency:BID   Dosage:0.0     Instructions:  Note:Dose: SCAR    . SM MULTIPLE VITAMINS/IRON TABS Take by mouth.    . sucralfate (CARAFATE) 1 G tablet Take 1 g by mouth 4 (four) times daily.    Marland Kitchen topiramate (TOPAMAX) 25 MG tablet TAKE  ONE TABLET BY MOUTH NIGHTLY AT BEDTIME 30 tablet 2  . traMADol (ULTRAM) 50 MG tablet Take 1 tablet (50 mg total) by mouth 4 (four) times daily. 120 tablet 5  . varenicline (CHANTIX STARTING MONTH PAK) 0.5 MG X 11 & 1 MG X 42 tablet Take by mouth.     No current facility-administered medications for this visit.    Medical Decision  Making:  Established Problem, Stable/Improving (1) and Review of Medication Regimen & Side Effects (2)  Treatment Plan Summary:Medication management and Plan   Major Depressive Disorder- is artery restarted her Cymbalta 30 mg daily for [place] will continue with this medication as she feels it provides benefit for her fibromyalgia. We will increase  Latuda from 20 mg to 40 mg.   PTSD- Cymbalta as above. We will increase the patient's Klonopin to 1 mg twice a day as needed for anxiety and a half a mg at bedtime as needed for anxiety. Cymbalta as above  Insomnia-We will start Lunesta 1 mg at bedtime as needed. Risk and benefits of been discussed patient's able to consent.   Patient will follow up in 2 month. I've informed patient of my departure from this clinic and that she would be able to see another psychiatrist in this clinic for follow-up.  Faith Rogue 01/17/2015, 2:30 PM

## 2015-01-31 ENCOUNTER — Ambulatory Visit: Payer: Medicaid Other | Admitting: Family Medicine

## 2015-02-06 NOTE — Progress Notes (Signed)
Refilled- reorder

## 2015-03-14 ENCOUNTER — Telehealth: Payer: Self-pay

## 2015-03-14 MED ORDER — LORATADINE 10 MG PO TABS: 10.0000 mg | ORAL_TABLET | Freq: Every day | ORAL | Status: DC

## 2015-03-14 NOTE — Telephone Encounter (Signed)
Rx sent to her pharmacy 

## 2015-03-14 NOTE — Telephone Encounter (Signed)
Loratadine  10 mg

## 2015-03-21 ENCOUNTER — Ambulatory Visit: Payer: Medicaid Other | Admitting: Psychiatry

## 2015-03-25 ENCOUNTER — Ambulatory Visit: Payer: Medicaid Other | Admitting: Psychiatry

## 2015-03-25 ENCOUNTER — Encounter: Payer: Self-pay | Admitting: Psychiatry

## 2015-03-25 MED ORDER — HALOPERIDOL 2 MG PO TABS: 2.0000 mg | ORAL_TABLET | Freq: Two times a day (BID) | ORAL | Status: DC

## 2015-03-25 MED ORDER — DULOXETINE HCL 60 MG PO CPEP: 60.0000 mg | ORAL_CAPSULE | Freq: Every day | ORAL | Status: DC

## 2015-03-25 NOTE — Progress Notes (Signed)
BH MD/PA/NP OP Progress Note  03/25/2015 3:49 PM Becky HNAT  MRN:  [id number]  Subjective:  Patient is a 45 year old Caucasian female with a long history of PTSD, major depressive disorder and anxiety. Patient today reports that she has not been doing well and has been hearing voices for about 6 months. She reports that the [place]  has not been helping her with the voices. Reports that she hears voices that say" it has been a long time and that it has been a disappointment". Patient reports that one of the stressors is she moved into her grandmother's home last August which is next door to her mother's home. Previously she had been living with her mother. She states that she has become more isolated and has not been meeting with her friends or enjoying music. Reports that she would spend a lot of time playing music and listening to music. She denies any current suicidal ideations. SHe reports trouble sleeping Chief Complaint:  Chief Complaint    Follow-up; Medication Refill; Stress; Insomnia     Visit Diagnosis:  No diagnosis found.  Past Medical History:  Past Medical History  Diagnosis Date  . GERD (gastroesophageal reflux disease)   . Depression   . Arthritis   . Plaque psoriasis   . Other specified disorder of skin     neurological skin disorder-breaks out when nervous  . Ulcer   . Anxiety   . Cervical spine fracture ([place]) 2008    Closed with screw C2-3 [facility] Dr. Delilah Shan  . Foot fracture, left   . 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 Auditory hallucinations   . Herniated cervical disc   . PTSD (post-traumatic stress disorder)   . ADD (attention deficit disorder)   . OCD (obsessive compulsive disorder)   . Degenerative joint disease of spine   . Neuromuscular disorder (HCC)     fibromyalgia/chronic fatique syndrome  . Carpal tunnel syndrome on both sides   . Neuropathy of foot   . Gastroparesis   . Hearing loss     Past Surgical History  Procedure  Laterality Date  . Spine surgery      cervical  . Nasal septum surgery    . Fracture surgery    . Occipital nerve blocks    . Cervical radiofrequency neurotomy    . Mouth surgery      upper and lower dentures   Family History:  Family History  Problem Relation Age of Onset  . Cancer Mother 41    breast cancer  . Migraines Mother   . Hypertension Mother   . Arthritis Mother   . Sleep apnea Mother   . Anxiety disorder Mother   . Depression Mother   . Diabetes Father   . Cancer Father 37    of spine , pancreas and liver  . Anxiety disorder Father   . Depression Father   . Diabetes Brother   . Hyperlipidemia Brother   . Sleep apnea Brother   . Cancer Paternal Aunt     breast  . Cancer Maternal Grandfather     leukemia, prostate  . Cancer Paternal Grandfather     bone  . Cancer Maternal Aunt     breast  . Mental illness Maternal Grandmother     Dementia  . Stroke Paternal Grandmother   . Diabetes Brother    Social History:  Social History   Social History  . Marital Status: Unknown  Spouse Name: N/A  . Number of Children: N/A  . Years of Education: N/A   Social History Main Topics  . Smoking status: Current Every Day Smoker -- 0.30 packs/day for 12 years    Types: Cigarettes    Start date: 03/01/2011  . Smokeless tobacco: Never Used  . Alcohol Use: No  . Drug Use: No  . Sexual Activity: No   Other Topics Concern  . None   Social History Narrative   Additional History:   Assessment:   Musculoskeletal: Strength & Muscle Tone: within normal limits Gait & Station: normal Patient leans: N/A  Psychiatric Specialty Exam: HPI  ROS  Blood pressure 122/82, pulse 112, temperature 98.4 F (36.9 C), temperature source Tympanic, height 5\' 5"  (1.651 m), weight 174 lb 3.2 oz (79.017 kg), last menstrual period 03/18/2015, SpO2 97 %.Body mass index is 28.99 kg/(m^2).  General Appearance: Casual  Eye Contact:  Fair  Speech:  Clear and Coherent  Volume:   Normal  Mood:  Depressed and Dysphoric  Affect:  Constricted and Depressed  Thought Process:  Coherent  Orientation:  Full (Time, Place, and Person)  Thought Content:  Hallucinations: Auditory and Rumination  Suicidal Thoughts:  No  Homicidal Thoughts:  No  Memory:  Immediate;   Fair Recent;   Fair Remote;   Fair  Judgement:  Intact  Insight:  Present  Psychomotor Activity:  Normal  Concentration:  Fair  Recall:  [employer] of Knowledge: Fair  Language: Fair  Akathisia:  No  Handed:  Right  AIMS (if indicated):    Assets:  Communication Skills Desire for Improvement Housing  ADL's:  Intact  Cognition: WNL  Sleep:  Not good recently   Is the patient at risk to self?  No. Has the patient been a risk to self in the past 6 months?  No. Has the patient been a risk to self within the distant past?  Yes.   Is the patient a risk to others?  No. Has the patient been a risk to others in the past 6 months?  No. Has the patient been a risk to others within the distant past?  No.  Current Medications: Current Outpatient Prescriptions  Medication Sig Dispense Refill  . Ascorbic Acid (VITAMIN C) 1000 MG tablet Take 1,000 mg by mouth.    Marland Kitchen aspirin EC 81 MG tablet Take 81 mg by mouth daily.    . betamethasone valerate ointment (VALISONE) 0.1 % Apply topically.    . Cholecalciferol (VITAMIN D3) 1000 UNITS CAPS Take by mouth.    . clonazePAM (KLONOPIN) 1 MG tablet One whole tablet twice daily as needed for anxiety and one half a tablet at bedtime as needed for anxiety. 75 tablet 4  . cyclobenzaprine (FLEXERIL) 10 MG tablet TAKE 2 TABLETS BY MOUTH AT BEDTIME AND 1 TABLET ONCE DAILY AS NEEDED *MUST LAST 30 DAYS* 90 tablet 3  . DULoxetine (CYMBALTA) 30 MG capsule Take 1 capsule (30 mg total) by mouth daily. 30 capsule 4  . fluticasone (FLONASE) 50 MCG/ACT nasal spray Place 2 sprays into both nostrils daily. 3 g inh  . loratadine (CLARITIN) 10 MG tablet Take 1 tablet (10 mg total) by mouth  daily. 30 tablet 11  . lurasidone (LATUDA) 40 MG TABS tablet Take 1 tablet (40 mg total) by mouth daily with breakfast. 30 tablet 4  . mupirocin ointment (BACTROBAN) 2 % Apply daily to open wounds until clear.    . nicotine (NICODERM CQ - DOSED IN  MG/24 HOURS) 14 mg/24hr patch Place 1 patch (14 mg total) onto the skin daily. 28 patch 0  . nicotine (NICODERM CQ - DOSED IN MG/24 HR) 7 mg/24hr patch Place 1 patch (7 mg total) onto the skin daily. 28 patch 0  . nicotine polacrilex (COMMIT) 2 MG lozenge 2 mg.    . Omega-3 Fatty Acids (FISH OIL) 1000 MG CAPS Take by mouth.    . OMEPRAZOLE PO Take 1 capsule by mouth 4 (four) times daily.    . Scar Treatment Products ([place]) GEL Frequency:BID   Dosage:0.0     Instructions:  Note:Dose: SCAR    . SM MULTIPLE VITAMINS/IRON TABS Take by mouth.    . sucralfate (CARAFATE) 1 G tablet Take 1 g by mouth 4 (four) times daily.    Marland Kitchen topiramate (TOPAMAX) 25 MG tablet TAKE  ONE TABLET BY MOUTH NIGHTLY AT BEDTIME 30 tablet 2  . traMADol (ULTRAM) 50 MG tablet Take 1 tablet (50 mg total) by mouth 4 (four) times daily. 120 tablet 5   No current facility-administered medications for this visit.    Medical Decision Making:  Established Problem, Worsening (2), Review of Last Therapy Session (1), Review of Medication Regimen & Side Effects (2) and Review of New Medication or Change in Dosage (2)  Treatment Plan Summary:Medication management  Major depressive disorder with psychotic symptoms InCrease Cymbalta to 60 mg once daily Patient to follow up with therapist DisContinue Latuda Start Haldol at 2 mg twice daily, patient aware of side effects and benefits  Anxiety Cymbalta as above and continue Klonopin at current dosage  PTSD Same treatment as above  Return to clinic in 2-3 weeks' time.     Becky Hubbard 03/25/2015, 3:49 PM

## 2015-04-09 ENCOUNTER — Other Ambulatory Visit: Payer: Self-pay | Admitting: Family Medicine

## 2015-04-10 ENCOUNTER — Ambulatory Visit: Payer: Medicaid Other | Admitting: Psychiatry

## 2015-04-24 ENCOUNTER — Ambulatory Visit: Payer: Medicaid Other | Admitting: Psychiatry

## 2015-05-01 ENCOUNTER — Telehealth: Payer: Self-pay | Admitting: Family Medicine

## 2015-05-01 DIAGNOSIS — G5603 Carpal tunnel syndrome, bilateral upper limbs: Secondary | ICD-10-CM

## 2015-05-01 NOTE — Telephone Encounter (Signed)
Fax from Dr. Melrose Nakayama sent over, needs referral to ortho for carpal tunnel syndrome. Referral generated today.

## 2015-05-13 ENCOUNTER — Ambulatory Visit: Payer: Self-pay | Admitting: Family Medicine

## 2015-05-14 ENCOUNTER — Telehealth: Payer: Self-pay

## 2015-05-14 MED ORDER — OMEPRAZOLE 40 MG PO CPDR: 40.0000 mg | DELAYED_RELEASE_CAPSULE | Freq: Two times a day (BID) | ORAL | Status: DC

## 2015-05-14 NOTE — Telephone Encounter (Signed)
Rx sent to her pharmacy 

## 2015-05-14 NOTE — Telephone Encounter (Signed)
Omeprazole 40 mg 1 capsule BID

## 2015-05-27 ENCOUNTER — Ambulatory Visit (INDEPENDENT_AMBULATORY_CARE_PROVIDER_SITE_OTHER): Payer: Medicaid Other | Admitting: Psychiatry

## 2015-06-18 ENCOUNTER — Other Ambulatory Visit: Payer: Self-pay | Admitting: Family Medicine

## 2015-07-04 ENCOUNTER — Ambulatory Visit (INDEPENDENT_AMBULATORY_CARE_PROVIDER_SITE_OTHER): Payer: Medicaid Other | Admitting: Family Medicine

## 2015-07-04 ENCOUNTER — Encounter: Payer: Self-pay | Admitting: Family Medicine

## 2015-07-04 VITALS — BP 138/86 | HR 114 | Temp 98.8°F | Ht 64.5 in | Wt 179.0 lb

## 2015-07-04 DIAGNOSIS — R7303 Prediabetes: Secondary | ICD-10-CM

## 2015-07-04 DIAGNOSIS — Z13220 Encounter for screening for lipoid disorders: Secondary | ICD-10-CM

## 2015-07-04 DIAGNOSIS — F331 Major depressive disorder, recurrent, moderate: Secondary | ICD-10-CM | POA: Diagnosis not present

## 2015-07-04 DIAGNOSIS — G5603 Carpal tunnel syndrome, bilateral upper limbs: Secondary | ICD-10-CM

## 2015-07-04 DIAGNOSIS — F4310 Post-traumatic stress disorder, unspecified: Secondary | ICD-10-CM | POA: Diagnosis not present

## 2015-07-04 DIAGNOSIS — M797 Fibromyalgia: Secondary | ICD-10-CM | POA: Diagnosis not present

## 2015-07-04 DIAGNOSIS — F909 Attention-deficit hyperactivity disorder, unspecified type: Secondary | ICD-10-CM | POA: Diagnosis not present

## 2015-07-04 DIAGNOSIS — F429 Obsessive-compulsive disorder, unspecified: Secondary | ICD-10-CM | POA: Diagnosis not present

## 2015-07-04 DIAGNOSIS — R440 Auditory hallucinations: Secondary | ICD-10-CM | POA: Diagnosis not present

## 2015-07-04 DIAGNOSIS — Z113 Encounter for screening for infections with a predominantly sexual mode of transmission: Secondary | ICD-10-CM | POA: Diagnosis not present

## 2015-07-04 DIAGNOSIS — R947 Abnormal results of other endocrine function studies: Secondary | ICD-10-CM

## 2015-07-04 DIAGNOSIS — F988 Other specified behavioral and emotional disorders with onset usually occurring in childhood and adolescence: Secondary | ICD-10-CM

## 2015-07-04 LAB — UA/M W/RFLX CULTURE, ROUTINE
Bilirubin, UA: NEGATIVE
Glucose, UA: NEGATIVE
Ketones, UA: NEGATIVE
Leukocytes, UA: NEGATIVE
Nitrite, UA: NEGATIVE
Protein, UA: NEGATIVE
RBC, UA: NEGATIVE
Specific Gravity, UA: <1.005 — ABNORMAL LOW (ref 1.005–1.030)
Urobilinogen, Ur: 0.2 mg/dL (ref 0.2–1.0)
pH, UA: 5.5 (ref 5.0–7.5)

## 2015-07-04 LAB — BAYER DCA HB A1C WAIVED: HB A1C (BAYER DCA - WAIVED): 5.6 % (ref <7.0)

## 2015-07-04 LAB — MICROALBUMIN, URINE WAIVED
Creatinine, Urine Waived: 50 mg/dL (ref 10–300)
Microalb, Ur Waived: 10 mg/L (ref 0–19)
Microalb/Creat Ratio: <30 mg/g (ref <30)

## 2015-07-04 MED ORDER — CLONAZEPAM 1 MG PO TABS: ORAL_TABLET | ORAL | Status: DC

## 2015-07-04 MED ORDER — DULOXETINE HCL 60 MG PO CPEP: 60.0000 mg | ORAL_CAPSULE | Freq: Every day | ORAL | Status: DC

## 2015-07-04 NOTE — Assessment & Plan Note (Signed)
No longer seeing psychiatry. States that she is feeling better, but needs to be under their care. New referral generated today. [place] Rx of her klonopin given today with PMP reviewed and checked with the pharmacy. Refill of her cymbalta given. Await consult. Continue to monitor closely.

## 2015-07-04 NOTE — Assessment & Plan Note (Signed)
Await results from endocrine. MRI on follow up was normal. May not need to follow up with them again.

## 2015-07-04 NOTE — Progress Notes (Signed)
BP 138/86 mmHg  Pulse 114  Temp(Src) 98.8 F (37.1 C)  Ht 5' 4.5" (1.638 m)  Wt 179 lb (81.194 kg)  BMI 30.26 kg/m2  SpO2 100%  LMP 06/03/2015 (Approximate)   Subjective:    Patient ID: Becky Hubbard, female    DOB: 11/02/1970, 45 y.o.   MRN: [id number]  HPI: Becky Hubbard is a 45 y.o. female who presents to this office after almost a year lost to follow up.  Chief Complaint  Patient presents with  . Follow-up    Patient states that she is here for a 6 month check up   Had been following with psychiatry. Last saw them in February- was supposed to follow up in 2-3 weeks, does not have follow up scheduled. No showed her last appointment in April and cancelled in March. Due to this, she has been dismissed from that practice. She had not been stable on her medication in February with increased depression and anxiety and auditory hallucinations. She notes that she suffered a nervous break down. She went to [place] for a while and states that she spent time with the [facility] and prayed a lot and is now feeling better. She notes that she had enough of her klonopin to last until this morning. She had been taking 2 tabs a day and a half at night. She was taking about a 1/4 of a pill 3x a day but she is now out and is wondering if we can bridge her until she gets in to see psychiatry. She also thinks that she needs a refill of her cymbalta as well. PMP reviewed today. Last Rx of her klonopin was filled 05/13/15 for 75 pills by Dr. Toney Rakes. This was her last refill of this medicine.   Has been seeing orthopedics for CTS and is going to have surgery done bilaterally sometime in the future. She notes that it is feeling numb and she has been dropping things. She has not gotten the surgery scheduled yet.   Still seeing neurology- sent her to orthopedics for CTS surgery. Neuropathy has not changed. Currently on gabapentin and tramadol.  Saw endocrinology about a week ago- has abnormal IGF and pituitary lesion. MRI  showed no pituitary cyst. They said that if her IGF-1 was normal, she doesn't need to follow up with them.   She notes that her belly is doing well as long as she keeps to her diet and keeps to her diets. If she cheats she gets the constipation.   Relevant past medical, surgical, family and social history reviewed and updated as indicated. Interim medical history since our last visit reviewed. Allergies and medications reviewed and updated.  Review of Systems  Constitutional: Negative.   Respiratory: Negative.   Cardiovascular: Negative.   Psychiatric/Behavioral: Positive for hallucinations, confusion, dysphoric mood, decreased concentration and agitation. Negative for suicidal ideas, behavioral problems, sleep disturbance and self-injury. The patient is nervous/anxious. The patient is not hyperactive.     Per HPI unless specifically indicated above     Objective:    BP 138/86 mmHg  Pulse 114  Temp(Src) 98.8 F (37.1 C)  Ht 5' 4.5" (1.638 m)  Wt 179 lb (81.194 kg)  BMI 30.26 kg/m2  SpO2 100%  LMP 06/03/2015 (Approximate)  Wt Readings from Last 3 Encounters:  07/04/15 179 lb (81.194 kg)  03/25/15 174 lb 3.2 oz (79.017 kg)  01/17/15 168 lb (76.204 kg)    Physical Exam  Constitutional: She is oriented to person, place, and time.  She appears well-developed and well-nourished. No distress.  HENT:  Head: Normocephalic and atraumatic.  Right Ear: Hearing and external ear normal.  Left Ear: Hearing and external ear normal.  Nose: Nose normal.  Mouth/Throat: Oropharynx is clear and moist. No oropharyngeal exudate.  Eyes: Conjunctivae and lids are normal. Right eye exhibits no discharge. Left eye exhibits no discharge. No scleral icterus.  Cardiovascular: Regular rhythm, normal heart sounds and intact distal pulses.  Tachycardia present.  Exam reveals no gallop and no friction rub.   No murmur heard. Pulmonary/Chest: Effort normal and breath sounds normal. No respiratory distress.  She has no wheezes. She has no rales. She exhibits no tenderness.  Musculoskeletal: Normal range of motion.  Neurological: She is alert and oriented to person, place, and time.  Skin: Skin is warm, dry and intact. No rash noted. She is not diaphoretic. No erythema. No pallor.  Psychiatric: Her speech is normal and behavior is normal. Judgment and thought content normal. Her affect is blunt. Cognition and memory are normal.  Nursing note and vitals reviewed.      Assessment & Plan:   Problem List Items Addressed This Visit      Endocrine   Borderline diabetes    Checking labs today. Await results. Continue to work on diet and exercise.      Relevant Orders   Bayer DCA Hb A1c Waived   Microalbumin, Urine Waived   UA/M w/rflx Culture, Routine     Nervous and Auditory   Carpal tunnel syndrome    To have surgery done with Dr. Luane School. Will call and get this scheduled.       Relevant Medications   DULoxetine (CYMBALTA) 60 MG capsule   clonazePAM (KLONOPIN) 1 MG tablet     Musculoskeletal and Integument   Fibromyalgia syndrome    Continue cymbalta. Rx given today. Continue to monitor.       Relevant Medications   aspirin 81 MG tablet   Other Relevant Orders   CBC with Differential/Platelet   Comprehensive metabolic panel   Microalbumin, Urine Waived   Lipid Panel w/o Chol/HDL Ratio   TSH   UA/M w/rflx Culture, Routine     Other   Auditory hallucinations    No longer seeing psychiatry. States that she is feeling better, but needs to be under their care. New referral generated today. Bridging Rx of her klonopin given today with PMP reviewed and checked with the pharmacy. Refill of her cymbalta given. Await consult. Continue to monitor closely.       Relevant Orders   Ambulatory referral to Psychiatry   PTSD (post-traumatic stress disorder)    No longer seeing psychiatry. States that she is feeling better, but needs to be under their care. New referral generated today.  Bridging Rx of her klonopin given today with PMP reviewed and checked with the pharmacy. Refill of her cymbalta given. Await consult. Continue to monitor closely.       Relevant Orders   Ambulatory referral to Psychiatry   ADD (attention deficit disorder)    No longer seeing psychiatry. States that she is feeling better, but needs to be under their care. New referral generated today. Bridging Rx of her klonopin given today with PMP reviewed and checked with the pharmacy. Refill of her cymbalta given. Await consult. Continue to monitor closely.       Relevant Orders   Ambulatory referral to Psychiatry   Nonspecific abnormal results of endocrine function study    Await results from endocrine.  MRI on follow up was normal. May not need to follow up with them again.       Depression, major, recurrent, moderate ([place]) - Primary    No longer seeing psychiatry. States that she is feeling better, but needs to be under their care. New referral generated today. Bridging Rx of her klonopin given today with PMP reviewed and checked with the pharmacy. Refill of her cymbalta given. Await consult. Continue to monitor closely.       Relevant Medications   DULoxetine (CYMBALTA) 60 MG capsule   Other Relevant Orders   TSH   Ambulatory referral to Psychiatry   Obsessive-compulsive disorder   Relevant Orders   Ambulatory referral to Psychiatry    Other Visit Diagnoses    Screening for STD (sexually transmitted disease)        Labs drawn today. Await results.     Relevant Orders    HIV antibody    GC/Chlamydia Probe Amp    Hepatitis C antibody    HSV(herpes simplex vrs) 1+2 ab-IgG    RPR    Screening for cholesterol level        Labs drawn today. Await results.     Relevant Orders    Lipid Panel w/o Chol/HDL Ratio        Follow up plan: Return in about 4 weeks (around 08/01/2015) for Follow up mood unless she's already in with psych.

## 2015-07-04 NOTE — Assessment & Plan Note (Signed)
To have surgery done with Dr. Luane School. Will call and get this scheduled.

## 2015-07-04 NOTE — Assessment & Plan Note (Signed)
Continue cymbalta. Rx given today. Continue to monitor.

## 2015-07-04 NOTE — Assessment & Plan Note (Signed)
Checking labs today. Await results. Continue to work on diet and exercise.

## 2015-07-06 LAB — GC/CHLAMYDIA PROBE AMP
Chlamydia trachomatis, NAA: NEGATIVE
Neisseria gonorrhoeae by PCR: NEGATIVE

## 2015-07-09 ENCOUNTER — Encounter: Payer: Self-pay | Admitting: *Deleted

## 2015-07-09 ENCOUNTER — Telehealth: Payer: Self-pay | Admitting: Family Medicine

## 2015-07-09 NOTE — Telephone Encounter (Signed)
Please let her know that her labs came back normal, including all her STI screening. Thanks!

## 2015-07-09 NOTE — Telephone Encounter (Signed)
Patient notified

## 2015-07-10 LAB — HSV(HERPES SIMPLEX VRS) I + II AB-IGG
HSV 1 Glycoprotein G Ab, IgG: <0.91 index (ref 0.00–0.90)
HSV 2 Glycoprotein G Ab, IgG: <0.91 index (ref 0.00–0.90)

## 2015-07-10 LAB — CBC WITH DIFFERENTIAL/PLATELET
Basophils Absolute: 0 10*3/uL (ref 0.0–0.2)
Basos: 1 %
EOS (ABSOLUTE): 0.6 10*3/uL — ABNORMAL HIGH (ref 0.0–0.4)
Eos: 7 %
Hematocrit: 40.5 % (ref 34.0–46.6)
Hemoglobin: 13.4 g/dL (ref 11.1–15.9)
Immature Grans (Abs): 0 10*3/uL (ref 0.0–0.1)
Immature Granulocytes: 0 %
Lymphocytes Absolute: 2.2 10*3/uL (ref 0.7–3.1)
Lymphs: 28 %
MCH: 28.8 pg (ref 26.6–33.0)
MCHC: 33.1 g/dL (ref 31.5–35.7)
MCV: 87 fL (ref 79–97)
Monocytes Absolute: 0.6 10*3/uL (ref 0.1–0.9)
Monocytes: 8 %
Neutrophils Absolute: 4.6 10*3/uL (ref 1.4–7.0)
Neutrophils: 56 %
Platelets: 242 10*3/uL (ref 150–379)
RBC: 4.66 x10E6/uL (ref 3.77–5.28)
RDW: 13.8 % (ref 12.3–15.4)
WBC: 8.1 10*3/uL (ref 3.4–10.8)

## 2015-07-10 LAB — COMPREHENSIVE METABOLIC PANEL
ALT: 15 IU/L (ref 0–32)
AST: 17 IU/L (ref 0–40)
Albumin/Globulin Ratio: 1.7 (ref 1.2–2.2)
Albumin: 4 g/dL (ref 3.5–5.5)
Alkaline Phosphatase: 54 IU/L (ref 39–117)
BUN/Creatinine Ratio: 14 (ref 9–23)
BUN: 10 mg/dL (ref 6–24)
Bilirubin Total: <0.2 mg/dL (ref 0.0–1.2)
CO2: 26 mmol/L (ref 18–29)
Calcium: 9.1 mg/dL (ref 8.7–10.2)
Chloride: 97 mmol/L (ref 96–106)
Creatinine, Ser: 0.72 mg/dL (ref 0.57–1.00)
GFR calc Af Amer: 116 mL/min/{1.73_m2} (ref >59)
GFR calc non Af Amer: 101 mL/min/{1.73_m2} (ref >59)
Globulin, Total: 2.4 g/dL (ref 1.5–4.5)
Glucose: 89 mg/dL (ref 65–99)
Potassium: 3.9 mmol/L (ref 3.5–5.2)
Sodium: 139 mmol/L (ref 134–144)
Total Protein: 6.4 g/dL (ref 6.0–8.5)

## 2015-07-10 LAB — RPR: RPR Ser Ql: NONREACTIVE

## 2015-07-10 LAB — LIPID PANEL W/O CHOL/HDL RATIO
Cholesterol, Total: 158 mg/dL (ref 100–199)
HDL: 29 mg/dL — ABNORMAL LOW (ref >39)
LDL Calculated: 79 mg/dL (ref 0–99)
Triglycerides: 248 mg/dL — ABNORMAL HIGH (ref 0–149)
VLDL Cholesterol Cal: 50 mg/dL — ABNORMAL HIGH (ref 5–40)

## 2015-07-10 LAB — HEPATITIS C ANTIBODY: Hep C Virus Ab: <0.1 s/co ratio (ref 0.0–0.9)

## 2015-07-10 LAB — TSH: TSH: 2.82 u[IU]/mL (ref 0.450–4.500)

## 2015-07-10 LAB — HIV ANTIBODY (ROUTINE TESTING W REFLEX): HIV Screen 4th Generation wRfx: NONREACTIVE

## 2015-07-12 ENCOUNTER — Ambulatory Visit
Admission: RE | Admit: 2015-07-12 | Discharge: 2015-07-12 | Disposition: A | Discharge status: Home / Self Care | Payer: Medicaid Other | Source: Ambulatory Visit | Attending: Unknown Physician Specialty | Admitting: Unknown Physician Specialty

## 2015-07-12 ENCOUNTER — Encounter
Admission: RE | Disposition: A | Discharge status: Home / Self Care | Payer: Self-pay | Source: Ambulatory Visit | Attending: Unknown Physician Specialty

## 2015-07-12 ENCOUNTER — Ambulatory Visit: Payer: Medicaid Other | Admitting: Anesthesiology

## 2015-07-12 DIAGNOSIS — F329 Major depressive disorder, single episode, unspecified: Secondary | ICD-10-CM | POA: Insufficient documentation

## 2015-07-12 DIAGNOSIS — M797 Fibromyalgia: Secondary | ICD-10-CM | POA: Insufficient documentation

## 2015-07-12 DIAGNOSIS — Z7951 Long term (current) use of inhaled steroids: Secondary | ICD-10-CM | POA: Insufficient documentation

## 2015-07-12 DIAGNOSIS — K219 Gastro-esophageal reflux disease without esophagitis: Secondary | ICD-10-CM | POA: Insufficient documentation

## 2015-07-12 DIAGNOSIS — G4700 Insomnia, unspecified: Secondary | ICD-10-CM | POA: Diagnosis not present

## 2015-07-12 DIAGNOSIS — Z803 Family history of malignant neoplasm of breast: Secondary | ICD-10-CM | POA: Insufficient documentation

## 2015-07-12 DIAGNOSIS — F4310 Post-traumatic stress disorder, unspecified: Secondary | ICD-10-CM | POA: Diagnosis not present

## 2015-07-12 DIAGNOSIS — Z9889 Other specified postprocedural states: Secondary | ICD-10-CM | POA: Diagnosis not present

## 2015-07-12 DIAGNOSIS — G5601 Carpal tunnel syndrome, right upper limb: Secondary | ICD-10-CM | POA: Diagnosis not present

## 2015-07-12 DIAGNOSIS — Z800 Family history of malignant neoplasm of digestive organs: Secondary | ICD-10-CM | POA: Insufficient documentation

## 2015-07-12 DIAGNOSIS — F988 Other specified behavioral and emotional disorders with onset usually occurring in childhood and adolescence: Secondary | ICD-10-CM | POA: Insufficient documentation

## 2015-07-12 DIAGNOSIS — F429 Obsessive-compulsive disorder, unspecified: Secondary | ICD-10-CM | POA: Insufficient documentation

## 2015-07-12 DIAGNOSIS — Z806 Family history of leukemia: Secondary | ICD-10-CM | POA: Insufficient documentation

## 2015-07-12 DIAGNOSIS — Z823 Family history of stroke: Secondary | ICD-10-CM | POA: Insufficient documentation

## 2015-07-12 DIAGNOSIS — G5602 Carpal tunnel syndrome, left upper limb: Secondary | ICD-10-CM | POA: Insufficient documentation

## 2015-07-12 DIAGNOSIS — Z8673 Personal history of transient ischemic attack (TIA), and cerebral infarction without residual deficits: Secondary | ICD-10-CM | POA: Diagnosis not present

## 2015-07-12 DIAGNOSIS — R5382 Chronic fatigue, unspecified: Secondary | ICD-10-CM | POA: Diagnosis not present

## 2015-07-12 DIAGNOSIS — Z79899 Other long term (current) drug therapy: Secondary | ICD-10-CM | POA: Insufficient documentation

## 2015-07-12 DIAGNOSIS — L400 Psoriasis vulgaris: Secondary | ICD-10-CM | POA: Diagnosis not present

## 2015-07-12 DIAGNOSIS — Z880 Allergy status to penicillin: Secondary | ICD-10-CM | POA: Diagnosis not present

## 2015-07-12 DIAGNOSIS — Z882 Allergy status to sulfonamides status: Secondary | ICD-10-CM | POA: Diagnosis not present

## 2015-07-12 DIAGNOSIS — Z888 Allergy status to other drugs, medicaments and biological substances status: Secondary | ICD-10-CM | POA: Insufficient documentation

## 2015-07-12 HISTORY — DX: Family history of other specified conditions: Z84.89

## 2015-07-12 HISTORY — PX: CARPAL TUNNEL RELEASE: SHX101

## 2015-07-12 HISTORY — DX: Fibromyalgia: M79.7

## 2015-07-12 SURGERY — CARPAL TUNNEL RELEASE
Anesthesia: Regional | Site: Hand | Laterality: Left | Wound class: Clean

## 2015-07-12 MED ORDER — HYDROCODONE-ACETAMINOPHEN 5-325 MG PO TABS: 1.0000 | ORAL_TABLET | Freq: Four times a day (QID) | ORAL | Status: DC | PRN

## 2015-07-12 MED ORDER — MIDAZOLAM HCL 2 MG/2ML IJ SOLN
INTRAMUSCULAR | Status: DC | PRN
  Administered 2015-07-12: 2 mg via INTRAVENOUS

## 2015-07-12 MED ORDER — LIDOCAINE HCL (CARDIAC) 20 MG/ML IV SOLN
INTRAVENOUS | Status: DC | PRN
  Administered 2015-07-12: 30 mg via INTRAVENOUS

## 2015-07-12 MED ORDER — PROPOFOL 500 MG/50ML IV EMUL
INTRAVENOUS | Status: DC | PRN
  Administered 2015-07-12: 75 ug/kg/min via INTRAVENOUS

## 2015-07-12 MED ORDER — LACTATED RINGERS IV SOLN
INTRAVENOUS | Status: DC | PRN
  Administered 2015-07-12: 07:00:00 via INTRAVENOUS

## 2015-07-12 MED ORDER — FENTANYL CITRATE (PF) 250 MCG/5ML IJ SOLN
INTRAMUSCULAR | Status: DC | PRN
  Administered 2015-07-12: 100 ug via INTRAVENOUS

## 2015-07-12 SURGICAL SUPPLY — 28 items
BANDAGE ELASTIC 2 CLIP NS LF (GAUZE/BANDAGES/DRESSINGS) ×3 IMPLANT
BNDG ESMARK 4X12 TAN STRL LF (GAUZE/BANDAGES/DRESSINGS) ×3 IMPLANT
BNDG STRETCH 4X75 STRL LF (GAUZE/BANDAGES/DRESSINGS) ×3 IMPLANT
COVER LIGHT HANDLE FLEXIBLE (MISCELLANEOUS) ×3 IMPLANT
CUFF TOURN SGL QUICK 18 (TOURNIQUET CUFF) ×3 IMPLANT
DURAPREP 26ML APPLICATOR (WOUND CARE) ×3 IMPLANT
GAUZE SPONGE 4X4 12PLY STRL (GAUZE/BANDAGES/DRESSINGS) ×3 IMPLANT
GLOVE BIO SURGEON STRL SZ7.5 (GLOVE) ×3 IMPLANT
GLOVE BIO SURGEON STRL SZ8 (GLOVE) ×6 IMPLANT
GLOVE INDICATOR 8.0 STRL GRN (GLOVE) ×3 IMPLANT
GOWN STRL REUS W/ TWL LRG LVL3 (GOWN DISPOSABLE) ×2 IMPLANT
GOWN STRL REUS W/TWL LRG LVL3 (GOWN DISPOSABLE) ×4
KIT ROOM TURNOVER OR (KITS) ×3 IMPLANT
LOOP VESSEL RED MINI 1.3X0.9 (MISCELLANEOUS) IMPLANT
LOOPS RED MINI 1.3MMX0.9MM (MISCELLANEOUS)
NS IRRIG 500ML POUR BTL (IV SOLUTION) ×3 IMPLANT
PACK EXTREMITY ARMC (MISCELLANEOUS) ×3 IMPLANT
PAD GROUND ADULT SPLIT (MISCELLANEOUS) ×3 IMPLANT
PADDING CAST 2X4YD ST (MISCELLANEOUS) ×2
PADDING CAST BLEND 2X4 STRL (MISCELLANEOUS) ×1 IMPLANT
SOL PREP PVP 2OZ (MISCELLANEOUS) ×3
SOLUTION PREP PVP 2OZ (MISCELLANEOUS) ×1 IMPLANT
SPLINT CAST 1 STEP 3X12 (MISCELLANEOUS) ×3 IMPLANT
STOCKINETTE 4X48 STRL (DRAPES) ×3 IMPLANT
STRAP BODY AND KNEE 60X3 (MISCELLANEOUS) ×3 IMPLANT
SUT ETHILON 4-0 (SUTURE) ×2
SUT ETHILON 4-0 FS2 18XMFL BLK (SUTURE) ×1
SUTURE ETHLN 4-0 FS2 18XMF BLK (SUTURE) ×1 IMPLANT

## 2015-07-12 NOTE — Anesthesia Procedure Notes (Signed)
Anesthesia Regional Block:  Supraclavicular block  Pre-Anesthetic Checklist: ,, timeout performed, Correct Patient, Correct Site, Correct Laterality, Correct Procedure, Correct Position, site marked, Risks and benefits discussed,  Surgical consent,  Pre-op evaluation,  At surgeon's request and post-op pain management  Laterality: Left  Prep: chloraprep       Needles:  Injection technique: Single-shot  Needle Type: Echogenic Stimulator Needle      Needle Gauge: 21 and 21 G    Additional Needles:  Procedures: ultrasound guided (picture in chart) Supraclavicular block  Nerve Stimulator or Paresthesia:  Response: bicep contraction, 0.45 mA,   Additional Responses:   Narrative:  Start time: 07/12/2015 7:29 AM End time: 07/12/2015 7:24 AM Injection made incrementally with aspirations every 5 mL.  Performed by: Personally   Additional Notes: Functioning IV was confirmed and monitors applied.  Sterile prep and drape,hand hygiene and sterile gloves were used.Ultrasound guidance: relevant anatomy identified, needle position confirmed, local anesthetic spread visualized around nerve(s)., vascular puncture avoided.  Image printed for medical record.  Negative aspiration and negative test dose prior to incremental administration of local anesthetic. The patient tolerated the procedure well. Vitals signes recorded in RN notes.

## 2015-07-12 NOTE — Anesthesia Preprocedure Evaluation (Signed)
Anesthesia Evaluation  Patient identified by MRN, date of birth, ID band Patient awake    Airway Mallampati: II  TM Distance: >3 FB Neck ROM: Full    Dental   Pulmonary Current Smoker,    Pulmonary exam normal        Cardiovascular Normal cardiovascular exam     Neuro/Psych Anxiety Depression    GI/Hepatic GERD  Medicated and Controlled,  Endo/Other    Renal/GU      Musculoskeletal  (+) Arthritis , Osteoarthritis,  Fibromyalgia -  Abdominal   Peds  Hematology   Anesthesia Other Findings   Reproductive/Obstetrics                             Anesthesia Physical Anesthesia Plan  ASA: II  Anesthesia Plan: Regional   Post-op Pain Management:    Induction: Intravenous  Airway Management Planned:   Additional Equipment:   Intra-op Plan:   Post-operative Plan:   Informed Consent: I have reviewed the patients History and Physical, chart, labs and discussed the procedure including the risks, benefits and alternatives for the proposed anesthesia with the patient or authorized representative who has indicated his/her understanding and acceptance.     Plan Discussed with: CRNA  Anesthesia Plan Comments:         Anesthesia Quick Evaluation

## 2015-07-12 NOTE — Anesthesia Postprocedure Evaluation (Signed)
Anesthesia Post Note  Patient: Becky Hubbard  Procedure(s) Performed: Procedure(s) (LRB): OPEN CARPAL TUNNEL RELEASE OF LEFT HAND (Left)  Patient location during evaluation: PACU Anesthesia Type: General Level of consciousness: awake and alert Pain management: pain level controlled Vital Signs Assessment: post-procedure vital signs reviewed and stable Respiratory status: spontaneous breathing, nonlabored ventilation, respiratory function stable and patient connected to nasal cannula oxygen Cardiovascular status: blood pressure returned to baseline and stable Postop Assessment: no signs of nausea or vomiting Anesthetic complications: no    Marshell Levan

## 2015-07-12 NOTE — Transfer of Care (Signed)
Immediate Anesthesia Transfer of Care Note  Patient: Becky Hubbard  Procedure(s) Performed: Procedure(s): OPEN CARPAL TUNNEL RELEASE (Left)  Patient Location: PACU  Anesthesia Type: Regional  Level of Consciousness: awake, alert  and patient cooperative  Airway and Oxygen Therapy: Patient Spontanous Breathing and Patient connected to supplemental oxygen  Post-op Assessment: Post-op Vital signs reviewed, Patient's Cardiovascular Status Stable, Respiratory Function Stable, Patent Airway and No signs of Nausea or vomiting  Post-op Vital Signs: Reviewed and stable  Complications: No apparent anesthesia complications

## 2015-07-12 NOTE — Discharge Instructions (Signed)
General Anesthesia, Adult, Care After Refer to this sheet in the next few weeks. These instructions provide you with information on caring for yourself after your procedure. Your health care provider may also give you more specific instructions. Your treatment has been planned according to current medical practices, but problems sometimes occur. Call your health care provider if you have any problems or questions after your procedure. WHAT TO EXPECT AFTER THE PROCEDURE After the procedure, it is typical to experience:  Sleepiness.  Nausea and vomiting. HOME CARE INSTRUCTIONS  For the first 24 hours after general anesthesia:  Have a responsible person with you.  Do not drive a car. If you are alone, do not take public transportation.  Do not drink alcohol.  Do not take medicine that has not been prescribed by your health care provider.  Do not sign important papers or make important decisions.  You may resume a normal diet and activities as directed by your health care provider.  Change bandages (dressings) as directed.  If you have questions or problems that seem related to general anesthesia, call the hospital and ask for the anesthetist or anesthesiologist on call. SEEK MEDICAL CARE IF:  You have nausea and vomiting that continue the day after anesthesia.  You develop a rash. SEEK IMMEDIATE MEDICAL CARE IF:   You have difficulty breathing.  You have chest pain.  You have any allergic problems.   This information is not intended to replace advice given to you by your health care provider. Make sure you discuss any questions you have with your health care provider.   Document Released: 05/04/2000 Document Revised: 02/16/2014 Document Reviewed: 05/27/2011 Elsevier Interactive Patient Education 2016 Webb pack   Elevation  Keep dressing dry  Use sling until motor function returns  RTC in about 10 days

## 2015-07-12 NOTE — Op Note (Signed)
DATE OF SURGERY:  07/12/2015  PATIENT NAME:  Becky Hubbard   DOB: [birth year]  MRN: [id number]  PRE-OPERATIVE DIAGNOSIS: Left carpal tunnel syndrome  POST-OPERATIVE DIAGNOSIS:  Same  PROCEDURE: Left carpal tunnel release  SURGEON: Dr. Leanor Kail, Brooke Bonito. M.D.  ANESTHESIA: Supraclavicular block   INDICATIONS FOR SURGERY: Becky Hubbard is a 45 y.o. year old female with a long history of numbness and paresthesias in the left hand. Nerve conduction studies demonstrated findings consistent with moderately severe  median nerve compression.The patient had not seen any significant improvement despite conservative nonsurgical intervention. After discussion of the risks and benefits of surgical intervention, the patient expressed understanding of the risks benefits and agreed with plans for carpal tunnel release.   PROCEDURE IN DETAIL: The patient was taken the operating room after supraclavicular block was achieved in the preop area. A tourniquet was placed on the patient's left upper arm.The left hand and arm were prepped  and draped in the usual sterile fashion. A "time-out" was performed as per usual protocol. The hand and forearm were exsanguinated using an Esmarch and the tourniquet was inflated to 250 mmHg.  An incision was made just ulnar to the thenar palmar crease. Dissection was carried down through the palmar fascia to the transverse carpal ligament. The transverse carpal ligament was sharply incised, taking care to protect the underlying structures within the carpal tunnel. Complete release of the transverse carpal ligament was achieved. There was no evidence of a mass or proliferative synovitis within the carpal tunnel. The wound was irrigated with saline. The tourniquet was released at this time. It had been up for about 12 minutes. Bleeding was controlled with digital pressure and coagulation cautery. The skin was then re-approximated with interrupted sutures of #4-0 nylon. A sterile dressing was  applied followed by application of a volar splint.  The patient was awakened and transferred to her stretcher bed.  The patient tolerated the procedure well and was transported to the PACU in stable condition. Blood loss was negligible.  Dr. Mariel Kansky. M.D.

## 2015-07-12 NOTE — H&P (Signed)
  H and P reviewed. No changes. Uploaded at later date. 

## 2015-07-15 ENCOUNTER — Encounter: Payer: Self-pay | Admitting: Unknown Physician Specialty

## 2015-07-15 ENCOUNTER — Telehealth: Payer: Self-pay | Admitting: Family Medicine

## 2015-07-15 DIAGNOSIS — F4310 Post-traumatic stress disorder, unspecified: Secondary | ICD-10-CM

## 2015-07-15 DIAGNOSIS — F331 Major depressive disorder, recurrent, moderate: Secondary | ICD-10-CM

## 2015-07-15 DIAGNOSIS — F429 Obsessive-compulsive disorder, unspecified: Secondary | ICD-10-CM

## 2015-07-15 DIAGNOSIS — R440 Auditory hallucinations: Secondary | ICD-10-CM

## 2015-07-15 DIAGNOSIS — F988 Other specified behavioral and emotional disorders with onset usually occurring in childhood and adolescence: Secondary | ICD-10-CM

## 2015-07-15 NOTE — Telephone Encounter (Signed)
Patient called stating that she has not heard from [place] about her Psych appointment. She stated that she was going to be referral to a new place with a new doctor. I looked and all I could see was a cancel ordered. Please call patient back regarding this, thanks.

## 2015-07-16 NOTE — Telephone Encounter (Signed)
I only see that she had the referral placed 5/25- can you let the patient know the status of this. Thanks!

## 2015-07-16 NOTE — Telephone Encounter (Signed)
I referred her to [facility] but they don't accept medicaid and I did not know that.  Could you enter in another referral and I'll send it to [facility]?

## 2015-07-16 NOTE — Telephone Encounter (Signed)
Referral generated. Thanks!

## 2015-07-17 ENCOUNTER — Other Ambulatory Visit: Payer: Self-pay | Admitting: Family Medicine

## 2015-07-22 ENCOUNTER — Telehealth: Payer: Self-pay | Admitting: Family Medicine

## 2015-07-22 NOTE — Telephone Encounter (Signed)
Pt called stated [facility] only offers counseling services and not psychiatry can she be referred to another facility? Please call pt to follow up. Thanks.

## 2015-07-22 NOTE — Telephone Encounter (Signed)
Did you notify patient of this?

## 2015-07-22 NOTE — Telephone Encounter (Signed)
I tried the day Dr. Wynetta Emery put in the new referral. Pt did not answer.

## 2015-07-22 NOTE — Telephone Encounter (Signed)
They sent me a note regarding this. I asked Dr. Wynetta Emery to enter a new one in and she did.  I referred her to [facility].  This was the only option for around here besides [facility] because no where else will accept medicaid.

## 2015-07-29 ENCOUNTER — Telehealth: Payer: Self-pay

## 2015-07-29 NOTE — Telephone Encounter (Signed)
Referred patient to [facility].  [facility] or [facility] is the next and only option.   [facility] declined patient because she had non-compliant appointments. [facility] either doesn't treat her certain disorders or doesn't accept medicaid. Will monitor referral through [facility].  Will notify patient of this change.

## 2015-07-29 NOTE — Telephone Encounter (Signed)
Thank you :)

## 2015-07-29 NOTE — Telephone Encounter (Signed)
Please note patient was dismissed from [facility] due to non-compliant appointments.  Please refer patient to a different psych office.

## 2015-08-01 ENCOUNTER — Other Ambulatory Visit: Payer: Self-pay | Admitting: Family Medicine

## 2015-08-01 NOTE — Telephone Encounter (Signed)
Please find out if she has enough to make it to 6/26 when she has an appointment. If not I can get her a refill. Thanks.

## 2015-08-02 NOTE — Telephone Encounter (Signed)
[facility]

## 2015-08-02 NOTE — Telephone Encounter (Signed)
Patient will not have enough medication for Sunday.

## 2015-08-02 NOTE — Telephone Encounter (Signed)
Left message for patient to return my call.

## 2015-08-02 NOTE — Telephone Encounter (Signed)
Refilled. OK to call in.  

## 2015-08-05 ENCOUNTER — Ambulatory Visit (INDEPENDENT_AMBULATORY_CARE_PROVIDER_SITE_OTHER): Payer: Medicaid Other | Admitting: Family Medicine

## 2015-08-05 ENCOUNTER — Other Ambulatory Visit: Payer: Self-pay | Admitting: Family Medicine

## 2015-08-05 ENCOUNTER — Encounter: Payer: Self-pay | Admitting: Family Medicine

## 2015-08-05 VITALS — BP 119/81 | HR 103 | Temp 98.7°F | Ht 64.6 in | Wt 187.0 lb

## 2015-08-05 DIAGNOSIS — Z30019 Encounter for initial prescription of contraceptives, unspecified: Secondary | ICD-10-CM

## 2015-08-05 DIAGNOSIS — H538 Other visual disturbances: Secondary | ICD-10-CM

## 2015-08-05 DIAGNOSIS — Z803 Family history of malignant neoplasm of breast: Secondary | ICD-10-CM

## 2015-08-05 DIAGNOSIS — M5020 Other cervical disc displacement, unspecified cervical region: Secondary | ICD-10-CM | POA: Diagnosis not present

## 2015-08-05 DIAGNOSIS — Z124 Encounter for screening for malignant neoplasm of cervix: Secondary | ICD-10-CM

## 2015-08-05 DIAGNOSIS — Z0000 Encounter for general adult medical examination without abnormal findings: Secondary | ICD-10-CM

## 2015-08-05 NOTE — Progress Notes (Signed)
BP 119/81 mmHg  Pulse 103  Temp(Src) 98.7 F (37.1 C)  Ht 5' 4.6" (1.641 m)  Wt 187 lb (84.823 kg)  BMI 31.50 kg/m2  SpO2 98%  LMP 07/15/2015 (Approximate)   Subjective:    Patient ID: Becky Hubbard, female    DOB: August 08, 1970, 45 y.o.   MRN: [id number]  HPI: Becky Hubbard is a 45 y.o. female presenting on 08/05/2015 for comprehensive medical examination. Current medical complaints include: Needs a new referral for neurosurgery. Her doctor is retiring and needs a new person to see.  Healing well from her CTS surgery.  Mood is doing OK- waiting for her referral to psychiatry  CONTRACEPTION CONCERNS Contraception: abstinence- but possibly starting a relationship Sexual activity: not sexually active Concerned about family history of breast cancer.   She currently lives with: Alone Menopausal Symptoms: yes- hot flashes and night sweats  Past Medical History:  Past Medical History  Diagnosis Date  . GERD (gastroesophageal reflux disease)   . Depression   . Arthritis   . Plaque psoriasis   . Other specified disorder of skin     neurological skin disorder-breaks out when nervous  . Ulcer   . Anxiety   . Cervical spine fracture ([place]) 2008    Closed with screw C2-3 [facility] Dr. Delilah Shan  . Foot fracture, left   . 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 Auditory hallucinations   . Herniated cervical disc   . PTSD (post-traumatic stress disorder)   . ADD (attention deficit disorder)   . OCD (obsessive compulsive disorder)   . Degenerative joint disease of spine   . Neuromuscular disorder (HCC)     fibromyalgia/chronic fatique syndrome  . Carpal tunnel syndrome on both sides   . Neuropathy of foot   . Gastroparesis   . Hearing loss   . Family history of adverse reaction to anesthesia     mom- slow to awaken  . Fibromyalgia    Surgical History:  Past Surgical History  Procedure Laterality Date  . Spine surgery      cervical  . Nasal septum surgery    .  Fracture surgery    . Occipital nerve blocks    . Cervical radiofrequency neurotomy    . Mouth surgery      upper and lower dentures  . Carpal tunnel release Left 07/12/2015    Procedure: OPEN CARPAL TUNNEL RELEASE OF LEFT HAND;  Surgeon: Leanor Kail, MD;  Location: [place];  Service: Orthopedics;  Laterality: Left;    Medications:  Current Outpatient Prescriptions on File Prior to Visit  Medication Sig  . aspirin 81 MG tablet Take 81 mg by mouth daily.  . Cholecalciferol (VITAMIN D3) 1000 UNITS CAPS Take by mouth.  . clonazePAM (KLONOPIN) 1 MG tablet TAKE ONE TABLET BY MOUTH TWICE DAILY AS NEEDED FOR ANXIETY AND A HALFTABLET AT BEDTIME AS NEEDED FOR ANXIETY  . Cyanocobalamin (RA VITAMIN B-12 TR) 1000 MCG TBCR Take by mouth.  . cyclobenzaprine (FLEXERIL) 10 MG tablet TAKE ONE TABLET BY MOUTH EVERY DAY AS NEEDED AND TAKE TWO TABLETS BY MOUTH AT BEDTIME *MUST LAST 30 DAYS*  . DULoxetine (CYMBALTA) 60 MG capsule Take 1 capsule (60 mg total) by mouth daily.  . fluticasone (FLONASE) 50 MCG/ACT nasal spray Place 2 sprays into both nostrils daily.  Marland Kitchen loratadine (CLARITIN) 10 MG tablet Take 1 tablet (10 mg total) by mouth daily.  . mupirocin ointment (BACTROBAN) 2 %  Apply daily to open wounds until clear.  . omega-3 acid ethyl esters (LOVAZA) 1 g capsule Take by mouth.  Marland Kitchen omeprazole (PRILOSEC) 40 MG capsule Take 1 capsule (40 mg total) by mouth 2 (two) times daily.  . Scar Treatment Products ([place]) GEL Frequency:BID   Dosage:0.0     Instructions:  Note:Dose: SCAR  . SM MULTIPLE VITAMINS/IRON TABS Take by mouth.  . sucralfate (CARAFATE) 1 G tablet Take 1 g by mouth 4 (four) times daily.  . traMADol (ULTRAM) 50 MG tablet Take 1 tablet (50 mg total) by mouth 4 (four) times daily.   No current facility-administered medications on file prior to visit.    Allergies:  Allergies  Allergen Reactions  . Lyrica [Pregabalin] Swelling    Ankles swell and turn red  . Chantix  [Varenicline] Rash  . Penicillin G Hives  . Penicillins Hives  . Sulfa Antibiotics Hives    Social History:  Social History   Social History  . Marital Status: Unknown    Spouse Name: N/A  . Number of Children: N/A  . Years of Education: N/A   Occupational History  . Not on file.   Social History Main Topics  . Smoking status: Current Every Day Smoker -- 0.50 packs/day for 12 years    Types: Cigarettes    Start date: 03/01/2011  . Smokeless tobacco: Never Used  . Alcohol Use: No  . Drug Use: No  . Sexual Activity: No   Other Topics Concern  . Not on file   Social History Narrative   History  Smoking status  . Current Every Day Smoker -- 0.50 packs/day for 12 years  . Types: Cigarettes  . Start date: 03/01/2011  Smokeless tobacco  . Never Used   History  Alcohol Use No   Family History:  Family History  Problem Relation Age of Onset  . Cancer Mother 42    breast cancer  . Migraines Mother   . Hypertension Mother   . Arthritis Mother   . Sleep apnea Mother   . Anxiety disorder Mother   . Depression Mother   . Diabetes Father   . Cancer Father 44    of spine , pancreas and liver  . Anxiety disorder Father   . Depression Father   . Diabetes Brother   . Hyperlipidemia Brother   . Sleep apnea Brother   . Cancer Paternal Aunt     breast  . Cancer Maternal Grandfather     leukemia, prostate  . Cancer Paternal Grandfather     bone  . Cancer Maternal Aunt     breast  . Mental illness Maternal Grandmother     Dementia  . Stroke Paternal Grandmother   . Diabetes Brother     Past medical history, surgical history, medications, allergies, family history and social history reviewed with patient today and changes made to appropriate areas of the chart.   Review of Systems  Constitutional: Positive for diaphoresis. Negative for fever, chills, weight loss and malaise/fatigue.  HENT: Positive for sore throat (has been snoring more and has had dry throat).  Negative for congestion, ear discharge, ear pain, hearing loss, nosebleeds and tinnitus.   Eyes: Positive for blurred vision. Negative for double vision, photophobia, pain, discharge and redness.       Needs to see eye doctor  Respiratory: Negative.  Negative for stridor.   Cardiovascular: Negative.   Gastrointestinal: Positive for nausea, abdominal pain and blood in stool (has a known hemorrohoid). Negative  for heartburn, vomiting, diarrhea, constipation and melena.  Genitourinary: Negative.   Musculoskeletal: Negative.   Skin: Negative.   Neurological: Positive for tingling. Negative for dizziness, tremors, sensory change, speech change, focal weakness, seizures, loss of consciousness, weakness and headaches.  Endo/Heme/Allergies: Positive for environmental allergies and polydipsia. Does not bruise/bleed easily.  Psychiatric/Behavioral: Positive for depression and hallucinations. Negative for suicidal ideas, memory loss and substance abuse. The patient is nervous/anxious and has insomnia.     All other ROS negative except what is listed above and in the HPI.      Objective:    BP 119/81 mmHg  Pulse 103  Temp(Src) 98.7 F (37.1 C)  Ht 5' 4.6" (1.641 m)  Wt 187 lb (84.823 kg)  BMI 31.50 kg/m2  SpO2 98%  LMP 07/15/2015 (Approximate)  Wt Readings from Last 3 Encounters:  08/05/15 187 lb (84.823 kg)  07/12/15 181 lb (82.101 kg)  07/04/15 179 lb (81.194 kg)    Physical Exam  Constitutional: She is oriented to person, place, and time. She appears well-developed and well-nourished. No distress.  HENT:  Head: Normocephalic and atraumatic.  Right Ear: Hearing, tympanic membrane, external ear and ear canal normal.  Left Ear: Hearing, tympanic membrane, external ear and ear canal normal.  Nose: Nose normal.  Mouth/Throat: Uvula is midline, oropharynx is clear and moist and mucous membranes are normal. No oropharyngeal exudate.  Eyes: Conjunctivae, EOM and lids are normal. Pupils are  equal, round, and reactive to light. Right eye exhibits no discharge. Left eye exhibits no discharge. No scleral icterus.  Neck: Normal range of motion. Neck supple. No JVD present. No tracheal deviation present. No thyromegaly present.  Cardiovascular: Normal rate, regular rhythm, normal heart sounds and intact distal pulses.  Exam reveals no gallop and no friction rub.   No murmur heard. Pulmonary/Chest: Effort normal and breath sounds normal. No stridor. No respiratory distress. She has no wheezes. She has no rales. She exhibits no tenderness. Right breast exhibits no inverted nipple, no mass, no nipple discharge, no skin change and no tenderness. Left breast exhibits no inverted nipple, no mass, no nipple discharge, no skin change and no tenderness. Breasts are symmetrical.  Abdominal: Soft. Bowel sounds are normal. She exhibits no distension and no mass. There is no rebound and no guarding. Hernia confirmed negative in the right inguinal area and confirmed negative in the left inguinal area.  Genitourinary: Vagina normal and uterus normal. No labial fusion. There is no rash, tenderness, lesion or injury on the right labia. There is no rash, tenderness, lesion or injury on the left labia. Uterus is not deviated, not enlarged, not fixed and not tender. Cervix exhibits no motion tenderness, no discharge and no friability. Right adnexum displays no mass, no tenderness and no fullness. Left adnexum displays no mass, no tenderness and no fullness. No erythema, tenderness or bleeding in the vagina. No foreign body around the vagina. No signs of injury around the vagina. No vaginal discharge found.  Musculoskeletal: Normal range of motion. She exhibits no edema or tenderness.  Lymphadenopathy:    She has no cervical adenopathy.       Right: No inguinal adenopathy present.       Left: No inguinal adenopathy present.  Neurological: She is alert and oriented to person, place, and time. She has normal reflexes.  She displays normal reflexes. No cranial nerve deficit. She exhibits normal muscle tone. Coordination normal.  Skin: Skin is warm, dry and intact. No rash noted. She is not  diaphoretic. No erythema. No pallor.  Scarring on arms and legs  Psychiatric: Her speech is normal. Judgment and thought content normal. She is slowed. Cognition and memory are normal. She exhibits a depressed mood.  Nursing note and vitals reviewed.   Results for orders placed or performed in visit on 07/04/15  GC/Chlamydia Probe Amp  Result Value Ref Range   Chlamydia trachomatis, NAA Negative Negative   Neisseria gonorrhoeae by PCR Negative Negative  HIV antibody  Result Value Ref Range   HIV Screen 4th Generation wRfx Non Reactive Non Reactive  Hepatitis C antibody  Result Value Ref Range   Hep C Virus Ab <0.1 0.0 - 0.9 s/co ratio  HSV(herpes simplex vrs) 1+2 ab-IgG  Result Value Ref Range   HSV 1 Glycoprotein G Ab, IgG <0.91 0.00 - 0.90 index   HSV 2 Glycoprotein G Ab, IgG <0.91 0.00 - 0.90 index  RPR  Result Value Ref Range   RPR Ser Ql Non Reactive Non Reactive  CBC with Differential/Platelet  Result Value Ref Range   WBC 8.1 3.4 - 10.8 x10E3/uL   RBC 4.66 3.77 - 5.28 x10E6/uL   Hemoglobin 13.4 11.1 - 15.9 g/dL   Hematocrit 40.5 34.0 - 46.6 %   MCV 87 79 - 97 fL   MCH 28.8 26.6 - 33.0 pg   MCHC 33.1 31.5 - 35.7 g/dL   RDW 13.8 12.3 - 15.4 %   Platelets 242 150 - 379 x10E3/uL   Neutrophils 56 %   Lymphs 28 %   Monocytes 8 %   Eos 7 %   Basos 1 %   Neutrophils Absolute 4.6 1.4 - 7.0 x10E3/uL   Lymphocytes Absolute 2.2 0.7 - 3.1 x10E3/uL   Monocytes Absolute 0.6 0.1 - 0.9 x10E3/uL   EOS (ABSOLUTE) 0.6 (H) 0.0 - 0.4 x10E3/uL   Basophils Absolute 0.0 0.0 - 0.2 x10E3/uL   Immature Granulocytes 0 %   Immature Grans (Abs) 0.0 0.0 - 0.1 x10E3/uL  Comprehensive metabolic panel  Result Value Ref Range   Glucose 89 65 - 99 mg/dL   BUN 10 6 - 24 mg/dL   Creatinine, Ser 0.72 0.57 - 1.00 mg/dL   GFR  calc non Af Amer 101 >59 mL/min/1.73   GFR calc Af Amer 116 >59 mL/min/1.73   BUN/Creatinine Ratio 14 9 - 23   Sodium 139 134 - 144 mmol/L   Potassium 3.9 3.5 - 5.2 mmol/L   Chloride 97 96 - 106 mmol/L   CO2 26 18 - 29 mmol/L   Calcium 9.1 8.7 - 10.2 mg/dL   Total Protein 6.4 6.0 - 8.5 g/dL   Albumin 4.0 3.5 - 5.5 g/dL   Globulin, Total 2.4 1.5 - 4.5 g/dL   Albumin/Globulin Ratio 1.7 1.2 - 2.2   Bilirubin Total <0.2 0.0 - 1.2 mg/dL   Alkaline Phosphatase 54 39 - 117 IU/L   AST 17 0 - 40 IU/L   ALT 15 0 - 32 IU/L  Bayer DCA Hb A1c Waived  Result Value Ref Range   Bayer DCA Hb A1c Waived 5.6 <7.0 %  Microalbumin, Urine Waived  Result Value Ref Range   Microalb, Ur Waived 10 0 - 19 mg/L   Creatinine, Urine Waived 50 10 - 300 mg/dL   Microalb/Creat Ratio <30 <30 mg/g  Lipid Panel w/o Chol/HDL Ratio  Result Value Ref Range   Cholesterol, Total 158 100 - 199 mg/dL   Triglycerides 248 (H) 0 - 149 mg/dL   HDL 29 (L) >39  mg/dL   VLDL Cholesterol Cal 50 (H) 5 - 40 mg/dL   LDL Calculated 79 0 - 99 mg/dL  TSH  Result Value Ref Range   TSH 2.820 0.450 - 4.500 uIU/mL  UA/M w/rflx Culture, Routine  Result Value Ref Range   Specific Gravity, UA <1.005 (L) 1.005 - 1.030   pH, UA 5.5 5.0 - 7.5   Color, UA Yellow Yellow   Appearance Ur Clear Clear   Leukocytes, UA Negative Negative   Protein, UA Negative Negative/Trace   Glucose, UA Negative Negative   Ketones, UA Negative Negative   RBC, UA Negative Negative   Bilirubin, UA Negative Negative   Urobilinogen, Ur 0.2 0.2 - 1.0 mg/dL   Nitrite, UA Negative Negative      Assessment & Plan:   Problem List Items Addressed This Visit      Musculoskeletal and Integument   Herniated cervical disc    Needs a new referral to neurosurgery as her surgeon is retiring. Referral generated today.      Relevant Orders   Ambulatory referral to Neurosurgery    Other Visit Diagnoses    Routine general medical examination at a health care  facility    -  Primary    Up to date on vaccines. Pap done today. Mammogram ordered. Screening labs last visit. Work on diet and exercise. Recheck 1 month.    Cervical cancer screening        Pap done today    Relevant Orders    IGP, Aptima HPV, rfx 16/18,45    Blurred vision        Referral to eye doctor made today.    Relevant Orders    Ambulatory referral to Ophthalmology    Family history of breast cancer in first degree relative        Would like to discuss birth control options with GYN given family history of breast cancer.     Relevant Orders    Ambulatory referral to Gynecology    Encounter for initial prescription of contraceptives        Would like to discuss birth control options with GYN given family history of breast cancer. Referral generated today.    Relevant Orders    Ambulatory referral to Gynecology        Follow up plan: Return 1-2 months, for follow up mood.   LABORATORY TESTING:  - Pap smear: pap done  IMMUNIZATIONS:   - Tdap: Tetanus vaccination status reviewed: last tetanus booster within 10 years. - Influenza: Up to date  SCREENING: -Mammogram: Ordered today   PATIENT COUNSELING:   Advised to take 1 mg of folate supplement per day if capable of pregnancy.   Sexuality: Discussed sexually transmitted diseases, partner selection, use of condoms, avoidance of unintended pregnancy  and contraceptive alternatives.   Advised to avoid cigarette smoking.  I discussed with the patient that most people either abstain from alcohol or drink within safe limits (<=14/week and <=4 drinks/occasion for males, <=7/weeks and <= 3 drinks/occasion for females) and that the risk for alcohol disorders and other health effects rises proportionally with the number of drinks per week and how often a drinker exceeds daily limits.  Discussed cessation/primary prevention of drug use and availability of treatment for abuse.   Diet: Encouraged to adjust caloric intake to  maintain  or achieve ideal body weight, to reduce intake of dietary saturated fat and total fat, to limit sodium intake by avoiding high sodium foods and not adding table  salt, and to maintain adequate dietary potassium and calcium preferably from fresh fruits, vegetables, and low-fat dairy products.    stressed the importance of regular exercise  Injury prevention: Discussed safety belts, safety helmets, smoke detector, smoking near bedding or upholstery.   Dental health: Discussed importance of regular tooth brushing, flossing, and dental visits.    NEXT PREVENTATIVE PHYSICAL DUE IN 1 YEAR. Return 1-2 months, for follow up mood.

## 2015-08-05 NOTE — Assessment & Plan Note (Signed)
Needs a new referral to neurosurgery as her surgeon is retiring. Referral generated today.

## 2015-08-05 NOTE — Patient Instructions (Addendum)
Menopause and Herbal Products WHAT IS MENOPAUSE? Menopause is the normal time of life when menstrual periods decrease in frequency and eventually stop completely. This process can take several years for some women. Menopause is complete when you have had an absence of menstruation for a full year since your last menstrual period. It usually occurs between the ages of 46 and 72. It is not common for menopause to begin before the age of 46. During menopause, your body stops producing the female hormones estrogen and progesterone. Common symptoms associated with this loss of hormones (vasomotor symptoms) are:  Hot flashes.  Hot flushes.  Night sweats. Other common symptoms and complications of menopause include:  Decrease in sex drive.  Vaginal dryness and thinning of the walls of the vagina. This can make sex painful.  Dryness of the skin and development of wrinkles.  Headaches.  Tiredness.  Irritability.  Memory problems.  Weight gain.  Bladder infections.  Hair growth on the face and chest.  Inability to reproduce offspring (infertility).  Loss of density in the bones (osteoporosis) increasing your risk for breaks (fractures).  Depression.  Hardening and narrowing of the arteries (atherosclerosis). This increases your risk of heart attack and stroke. WHAT TREATMENT OPTIONS ARE AVAILABLE? There are many treatment choices for menopause symptoms. The most common treatment is hormone replacement therapy. Many alternative therapies for menopause are emerging, including the use of herbal products. These supplements can be found in the form of herbs, teas, oils, tinctures, and pills. Common herbal supplements for menopause are made from plants that contain phytoestrogens. Phytoestrogens are compounds that occur naturally in plants and plant products. They act like estrogen in the body. Foods and herbs that contain phytoestrogens include:  Soy.  Flax seeds.  Red  clover.  Ginseng.  Evening Primrose Oil WHAT MENOPAUSE SYMPTOMS MAY BE HELPED IF I USE HERBAL PRODUCTS?  Vasomotor symptoms. These may be helped by:  Soy. Some studies show that soy may have a moderate benefit for hot flashes.  Black cohosh. There is limited evidence indicating this may be beneficial for hot flashes.  Symptoms that are related to heart and blood vessel disease. These may be helped by soy. Studies have shown that soy can help to lower cholesterol.  Depression. This may be helped by:  St. John's wort. There is limited evidence that shows this may help mild to moderate depression.  Black cohosh. There is evidence that this may help depression and mood swings.  Osteoporosis. Soy may help to decrease bone loss that is associated with menopause and may prevent osteoporosis. Limited evidence indicates that red clover may offer some bone loss protection as well. Other herbal products that are commonly used during menopause lack enough evidence to support their use as a replacement for conventional menopause therapies. These products include evening primrose, ginseng, and red clover. WHAT ARE THE CASES WHEN HERBAL PRODUCTS SHOULD NOT BE USED DURING MENOPAUSE? Do not use herbal products during menopause without your health care provider's approval if:  You are taking medicine.  You have a preexisting liver condition. ARE THERE ANY RISKS IN MY TAKING HERBAL PRODUCTS DURING MENOPAUSE? If you choose to use herbal products to help with symptoms of menopause, keep in mind that:  Different supplements have different and unmeasured amounts of herbal ingredients.  Herbal products are not regulated the same way that medicines are.  Concentrations of herbs may vary depending on the way they are prepared. For example, the concentration may be different in a pill,  tea, oil, and tincture.  Little is known about the risks of using herbal products, particularly the risks of long-term  use.  Some herbal supplements can be harmful when combined with certain medicines. Most commonly reported side effects of herbal products are mild. However, if used improperly, many herbal supplements can cause serious problems. Talk to your health care provider before starting any herbal product. If problems develop, stop taking the supplement and let your health care provider know.   This information is not intended to replace advice given to you by your health care provider. Make sure you discuss any questions you have with your health care provider.   Document Released: 07/15/2007 Document Revised: 02/16/2014 Document Reviewed: 07/11/2013 Elsevier Interactive Patient Education 2016 [place] Maintenance, Female Adopting a healthy lifestyle and getting preventive care can go a long way to promote health and wellness. Talk with your health care provider about what schedule of regular examinations is right for you. This is a good chance for you to check in with your provider about disease prevention and staying healthy. In between checkups, there are plenty of things you can do on your own. Experts have done a lot of research about which lifestyle changes and preventive measures are most likely to keep you healthy. Ask your health care provider for more information. WEIGHT AND DIET  Eat a healthy diet  Be sure to include plenty of vegetables, fruits, low-fat dairy products, and lean protein.  Do not eat a lot of foods high in solid fats, added sugars, or salt.  Get regular exercise. This is one of the most important things you can do for your health.  Most adults should exercise for at least 150 minutes each week. The exercise should increase your heart rate and make you sweat (moderate-intensity exercise).  Most adults should also do strengthening exercises at least twice a week. This is in addition to the moderate-intensity exercise.  Maintain a healthy weight  Body mass  index (BMI) is a measurement that can be used to identify possible weight problems. It estimates body fat based on height and weight. Your health care provider can help determine your BMI and help you achieve or maintain a healthy weight.  For females 88 years of age and older:   A BMI below 18.5 is considered underweight.  A BMI of 18.5 to 24.9 is normal.  A BMI of 25 to 29.9 is considered overweight.  A BMI of 30 and above is considered obese.  Watch levels of cholesterol and blood lipids  You should start having your blood tested for lipids and cholesterol at 46 years of age, then have this test every 5 years.  You may need to have your cholesterol levels checked more often if:  Your lipid or cholesterol levels are high.  You are older than 46 years of age.  You are at high risk for heart disease.  CANCER SCREENING   Lung Cancer  Lung cancer screening is recommended for adults 91-14 years old who are at high risk for lung cancer because of a history of smoking.  A yearly low-dose CT scan of the lungs is recommended for people who:  Currently smoke.  Have quit within the past 15 years.  Have at least a 30-pack-year history of smoking. A pack year is smoking an average of one pack of cigarettes a day for 1 year.  Yearly screening should continue until it has been 15 years since you quit.  Yearly screening should  stop if you develop a health problem that would prevent you from having lung cancer treatment.  Breast Cancer  Practice breast self-awareness. This means understanding how your breasts normally appear and feel.  It also means doing regular breast self-exams. Let your health care provider know about any changes, no matter how small.  If you are in your 20s or 30s, you should have a clinical breast exam (CBE) by a health care provider every 1-3 years as part of a regular health exam.  If you are 57 or older, have a CBE every year. Also consider having a  breast X-ray (mammogram) every year.  If you have a family history of breast cancer, talk to your health care provider about genetic screening.  If you are at high risk for breast cancer, talk to your health care provider about having an MRI and a mammogram every year.  Breast cancer gene (BRCA) assessment is recommended for women who have family members with BRCA-related cancers. BRCA-related cancers include:  Breast.  Ovarian.  Tubal.  Peritoneal cancers.  Results of the assessment will determine the need for genetic counseling and BRCA1 and BRCA2 testing. Cervical Cancer Your health care provider may recommend that you be screened regularly for cancer of the pelvic organs (ovaries, uterus, and vagina). This screening involves a pelvic examination, including checking for microscopic changes to the surface of your cervix (Pap test). You may be encouraged to have this screening done every 3 years, beginning at age 15.  For women ages 85-65, health care providers may recommend pelvic exams and Pap testing every 3 years, or they may recommend the Pap and pelvic exam, combined with testing for human papilloma virus (HPV), every 5 years. Some types of HPV increase your risk of cervical cancer. Testing for HPV may also be done on women of any age with unclear Pap test results.  Other health care providers may not recommend any screening for nonpregnant women who are considered low risk for pelvic cancer and who do not have symptoms. Ask your health care provider if a screening pelvic exam is right for you.  If you have had past treatment for cervical cancer or a condition that could lead to cancer, you need Pap tests and screening for cancer for at least 20 years after your treatment. If Pap tests have been discontinued, your risk factors (such as having a new sexual partner) need to be reassessed to determine if screening should resume. Some women have medical problems that increase the chance of  getting cervical cancer. In these cases, your health care provider may recommend more frequent screening and Pap tests. Colorectal Cancer  This type of cancer can be detected and often prevented.  Routine colorectal cancer screening usually begins at 46 years of age and continues through 46 years of age.  Your health care provider may recommend screening at an earlier age if you have risk factors for colon cancer.  Your health care provider may also recommend using home test kits to check for hidden blood in the stool.  A small camera at the end of a tube can be used to examine your colon directly (sigmoidoscopy or colonoscopy). This is done to check for the earliest forms of colorectal cancer.  Routine screening usually begins at age 88.  Direct examination of the colon should be repeated every 5-10 years through 46 years of age. However, you may need to be screened more often if early forms of precancerous polyps or small growths are  found. Skin Cancer  Check your skin from head to toe regularly.  Tell your health care provider about any new moles or changes in moles, especially if there is a change in a mole's shape or color.  Also tell your health care provider if you have a mole that is larger than the size of a pencil eraser.  Always use sunscreen. Apply sunscreen liberally and repeatedly throughout the day.  Protect yourself by wearing long sleeves, pants, a wide-brimmed hat, and sunglasses whenever you are outside. HEART DISEASE, DIABETES, AND HIGH BLOOD PRESSURE   High blood pressure causes heart disease and increases the risk of stroke. High blood pressure is more likely to develop in:  People who have blood pressure in the high end of the normal range (130-139/85-89 mm Hg).  People who are overweight or obese.  People who are African American.  If you are 5-35 years of age, have your blood pressure checked every 3-5 years. If you are 41 years of age or older, have  your blood pressure checked every year. You should have your blood pressure measured twice--once when you are at a hospital or clinic, and once when you are not at a hospital or clinic. Record the average of the two measurements. To check your blood pressure when you are not at a hospital or clinic, you can use:  An automated blood pressure machine at a pharmacy.  A home blood pressure monitor.  If you are between 57 years and 32 years old, ask your health care provider if you should take aspirin to prevent strokes.  Have regular diabetes screenings. This involves taking a blood sample to check your fasting blood sugar level.  If you are at a normal weight and have a low risk for diabetes, have this test once every three years after 46 years of age.  If you are overweight and have a high risk for diabetes, consider being tested at a younger age or more often. PREVENTING INFECTION  Hepatitis B  If you have a higher risk for hepatitis B, you should be screened for this virus. You are considered at high risk for hepatitis B if:  You were born in a country where hepatitis B is common. Ask your health care provider which countries are considered high risk.  Your parents were born in a high-risk country, and you have not been immunized against hepatitis B (hepatitis B vaccine).  You have HIV or AIDS.  You use needles to inject street drugs.  You live with someone who has hepatitis B.  You have had sex with someone who has hepatitis B.  You get hemodialysis treatment.  You take certain medicines for conditions, including cancer, organ transplantation, and autoimmune conditions. Hepatitis C  Blood testing is recommended for:  Everyone born from [birth year] through 1966.  Anyone with known risk factors for hepatitis C. Sexually transmitted infections (STIs)  You should be screened for sexually transmitted infections (STIs) including gonorrhea and chlamydia if:  You are sexually active and  are younger than 46 years of age.  You are older than 46 years of age and your health care provider tells you that you are at risk for this type of infection.  Your sexual activity has changed since you were last screened and you are at an increased risk for chlamydia or gonorrhea. Ask your health care provider if you are at risk.  If you do not have HIV, but are at risk, it may be recommended that you  take a prescription medicine daily to prevent HIV infection. This is called pre-exposure prophylaxis (PrEP). You are considered at risk if:  You are sexually active and do not regularly use condoms or know the HIV status of your partner(s).  You take drugs by injection.  You are sexually active with a partner who has HIV. Talk with your health care provider about whether you are at high risk of being infected with HIV. If you choose to begin PrEP, you should first be tested for HIV. You should then be tested every 3 months for as long as you are taking PrEP.  PREGNANCY   If you are premenopausal and you may become pregnant, ask your health care provider about preconception counseling.  If you may become pregnant, take 400 to 800 micrograms (mcg) of folic acid every day.  If you want to prevent pregnancy, talk to your health care provider about birth control (contraception). OSTEOPOROSIS AND MENOPAUSE   Osteoporosis is a disease in which the bones lose minerals and strength with aging. This can result in serious bone fractures. Your risk for osteoporosis can be identified using a bone density scan.  If you are 52 years of age or older, or if you are at risk for osteoporosis and fractures, ask your health care provider if you should be screened.  Ask your health care provider whether you should take a calcium or vitamin D supplement to lower your risk for osteoporosis.  Menopause may have certain physical symptoms and risks.  Hormone replacement therapy may reduce some of these symptoms  and risks. Talk to your health care provider about whether hormone replacement therapy is right for you.  HOME CARE INSTRUCTIONS   Schedule regular health, dental, and eye exams.  Stay current with your immunizations.   Do not use any tobacco products including cigarettes, chewing tobacco, or electronic cigarettes.  If you are pregnant, do not drink alcohol.  If you are breastfeeding, limit how much and how often you drink alcohol.  Limit alcohol intake to no more than 1 drink per day for nonpregnant women. One drink equals 12 ounces of beer, 5 ounces of wine, or 1 ounces of hard liquor.  Do not use street drugs.  Do not share needles.  Ask your health care provider for help if you need support or information about quitting drugs.  Tell your health care provider if you often feel depressed.  Tell your health care provider if you have ever been abused or do not feel safe at home.   This information is not intended to replace advice given to you by your health care provider. Make sure you discuss any questions you have with your health care provider.   Document Released: 08/11/2010 Document Revised: 02/16/2014 Document Reviewed: 12/28/2012 Elsevier Interactive Patient Education [employer].

## 2015-08-07 ENCOUNTER — Encounter: Payer: Self-pay | Admitting: Family Medicine

## 2015-08-07 LAB — IGP, APTIMA HPV, RFX 16/18,45
HPV Aptima: NEGATIVE
PAP Smear Comment: 0

## 2015-08-19 ENCOUNTER — Telehealth: Payer: Self-pay | Admitting: Family Medicine

## 2015-08-19 NOTE — Telephone Encounter (Signed)
Keri: Please contact patient about this

## 2015-08-19 NOTE — Telephone Encounter (Signed)
Pt called to inquire about a psychiatry referral that has not been followed up on yet. Please call pt back ASAP. Pt stated [facility] called her and stated they do not do psychiatry. Thanks.

## 2015-08-19 NOTE — Telephone Encounter (Signed)
I spoke with patient just the other week I referred her to [facility]. [facility] denied patient's referral for non-compliant appointments. [facility] doesn't treat her conditions. The other locations are private and do not accept medicaid. Patient was notified of this.   Patient's phone went to voicemail and I called the other number on patient's chart.  Her mother answered and said she was laying down, she didn't feel good.  I said the referral has been submitted and she can now call regarding the status.  Patient's mother notified. The number is [phone]. She said she would give it to her, once she wakes up.

## 2015-08-27 ENCOUNTER — Telehealth: Payer: Self-pay | Admitting: Family Medicine

## 2015-08-27 MED ORDER — CLONAZEPAM 1 MG PO TABS: 1.0000 mg | ORAL_TABLET | Freq: Two times a day (BID) | ORAL | Status: DC | PRN

## 2015-08-27 NOTE — Telephone Encounter (Signed)
Pt would like to get a refill on clonazePAM (KLONOPIN) 1 MG tablet, she stated she will run out Sunday

## 2015-08-27 NOTE — Telephone Encounter (Signed)
I will give her #20 pills which will be enough till Dr. Wynetta Emery gets back

## 2015-08-27 NOTE — Telephone Encounter (Signed)
Forwarding to Dr.Johnson's box

## 2015-08-28 NOTE — Telephone Encounter (Signed)
Patient notified

## 2015-09-05 ENCOUNTER — Ambulatory Visit: Payer: Medicaid Other | Admitting: Family Medicine

## 2015-09-06 ENCOUNTER — Telehealth: Payer: Self-pay | Admitting: Family Medicine

## 2015-09-06 NOTE — Telephone Encounter (Signed)
Pt called stated she has not gotten an appt @ [facility] yet. Would like Dr. Wynetta Emery to refill her nerve medication. Also stated she no longer has a Garment/textile technologist as hers has retired, wants to know if Dr. Wynetta Emery can refill her Tramadol as well. Please call pt with any questions or concerns. Thanks.   Pharm is [facility] on [address]

## 2015-09-09 ENCOUNTER — Other Ambulatory Visit: Payer: Self-pay | Admitting: Family Medicine

## 2015-09-09 ENCOUNTER — Telehealth: Payer: Self-pay | Admitting: Family Medicine

## 2015-09-09 NOTE — Telephone Encounter (Signed)
Routing to provider  

## 2015-09-09 NOTE — Telephone Encounter (Signed)
Pt would like refill on clonazePAM (KLONOPIN) 1 MG tablet. She was unaware that she had an appt scheduled for 07/28 and has rescheduled for 09/23/15.

## 2015-09-10 ENCOUNTER — Other Ambulatory Visit: Payer: Self-pay | Admitting: Family Medicine

## 2015-09-10 MED ORDER — CLONAZEPAM 1 MG PO TABS: ORAL_TABLET | ORAL | 0 refills | Status: DC

## 2015-09-10 NOTE — Telephone Encounter (Signed)
Please get patient scheduled.  °

## 2015-09-10 NOTE — Telephone Encounter (Signed)
She missed her appointment. Will need appointment ASAP

## 2015-09-10 NOTE — Telephone Encounter (Signed)
Pt notified of refill to get her to her appt on 09/23/15

## 2015-09-10 NOTE — Telephone Encounter (Signed)
Your patient.  Thanks 

## 2015-09-10 NOTE — Telephone Encounter (Signed)
Patient notified

## 2015-09-10 NOTE — Telephone Encounter (Addendum)
14 days of meds written to get her to that appointment. Will see her at her appointment. Rx up front for her to pick up. She HAS to make the appointment with [facility] psychiatry, even if it is 4-6 weeks. They have her referral and are just waiting for her to make the appointment. I will only be able continue to prescribe her meds for another 3-4 months because I am only bridging her until she gets in to see them, and I am not going to continue to write them without her being under the care of psychiatry.

## 2015-09-17 ENCOUNTER — Encounter: Payer: Self-pay | Admitting: Family Medicine

## 2015-09-20 ENCOUNTER — Telehealth: Payer: Self-pay | Admitting: Family Medicine

## 2015-09-23 ENCOUNTER — Ambulatory Visit: Payer: Medicaid Other | Admitting: Family Medicine

## 2015-09-24 NOTE — Telephone Encounter (Signed)
Noted  

## 2015-09-26 ENCOUNTER — Ambulatory Visit: Payer: Medicaid Other | Admitting: Family Medicine

## 2015-10-04 ENCOUNTER — Encounter: Payer: Self-pay | Admitting: Family Medicine

## 2015-10-04 ENCOUNTER — Ambulatory Visit (INDEPENDENT_AMBULATORY_CARE_PROVIDER_SITE_OTHER): Payer: Medicaid Other | Admitting: Family Medicine

## 2015-10-04 VITALS — BP 117/84 | HR 108 | Temp 98.4°F | Wt 191.0 lb

## 2015-10-04 DIAGNOSIS — M47812 Spondylosis without myelopathy or radiculopathy, cervical region: Secondary | ICD-10-CM | POA: Diagnosis not present

## 2015-10-04 DIAGNOSIS — Z803 Family history of malignant neoplasm of breast: Secondary | ICD-10-CM | POA: Diagnosis not present

## 2015-10-04 DIAGNOSIS — Z9109 Other allergy status, other than to drugs and biological substances: Secondary | ICD-10-CM

## 2015-10-04 DIAGNOSIS — J45909 Unspecified asthma, uncomplicated: Secondary | ICD-10-CM

## 2015-10-04 DIAGNOSIS — F4310 Post-traumatic stress disorder, unspecified: Secondary | ICD-10-CM | POA: Diagnosis not present

## 2015-10-04 DIAGNOSIS — Z91048 Other nonmedicinal substance allergy status: Secondary | ICD-10-CM | POA: Diagnosis not present

## 2015-10-04 MED ORDER — ALBUTEROL SULFATE (2.5 MG/3ML) 0.083% IN NEBU: 2.5000 mg | INHALATION_SOLUTION | Freq: Four times a day (QID) | RESPIRATORY_TRACT | 1 refills | Status: DC | PRN

## 2015-10-04 MED ORDER — TRAMADOL HCL 50 MG PO TABS: 50.0000 mg | ORAL_TABLET | Freq: Two times a day (BID) | ORAL | 1 refills | Status: DC | PRN

## 2015-10-04 MED ORDER — FLUTICASONE PROPIONATE 50 MCG/ACT NA SUSP: 2.0000 | Freq: Every day | NASAL | 12 refills | Status: DC

## 2015-10-04 NOTE — Assessment & Plan Note (Signed)
PMP reviewed and appropriate. Has not gotten tramadol from anyone since May. Given it originally for CTS. Will give small amount of tramadol to be used only with extreme pain to bridge her until she gets into see neurosurgery. Call with concerns.

## 2015-10-04 NOTE — Assessment & Plan Note (Signed)
Refill of her flonase given. Continue to monitor.

## 2015-10-04 NOTE — Progress Notes (Signed)
BP 117/84 (BP Location: Left Arm, Patient Position: Sitting, Cuff Size: Normal)   Pulse (!) 108   Temp 98.4 F (36.9 C)   Wt 191 lb (86.6 kg)   BMI 32.18 kg/m    Subjective:    Patient ID: Becky Hubbard, female    DOB: Jan 22, 1971, 45 y.o.   MRN: [id number]  HPI: Becky Hubbard is a 45 y.o. female  Chief Complaint  Patient presents with  . mood  . Allergic Rhinitis     patient needs a refill on Flonase   Unsure about her new psychiatrist. [place] Scrape like he is very young and is concerned that he didn't know what he was doing. Will continue to follow with him for now, but would like to try to see if she can get in somewhere else.   OB-GYN called her, but didn't leave a number, so she needs a way to contact them.   Going to see Dr. Chancy Milroy for injection- hasn't set up an appointment. Has been doing more things, feeling better. Been to church and singing in the choir.   ALLERGIES Duration: chronic Runny nose: yes  Nasal congestion: no Nasal itching: yes Sneezing: yes Eye swelling, itching or discharge: no Post nasal drip: no Cough: no Sinus pressure: no  Ear pain: no  Ear pressure: no  Fever: no   Relevant past medical, surgical, family and social history reviewed and updated as indicated. Interim medical history since our last visit reviewed. Allergies and medications reviewed and updated.  Review of Systems  Constitutional: Negative.   Respiratory: Positive for wheezing. Negative for apnea, cough, choking, chest tightness, shortness of breath and stridor.   Cardiovascular: Negative.   Psychiatric/Behavioral: Negative.     Per HPI unless specifically indicated above     Objective:    BP 117/84 (BP Location: Left Arm, Patient Position: Sitting, Cuff Size: Normal)   Pulse (!) 108   Temp 98.4 F (36.9 C)   Wt 191 lb (86.6 kg)   BMI 32.18 kg/m   Wt Readings from Last 3 Encounters:  10/04/15 191 lb (86.6 kg)  08/05/15 187 lb (84.8 kg)  07/12/15 181 lb (82.1 kg)      Physical Exam  Constitutional: She is oriented to person, place, and time. She appears well-developed and well-nourished. No distress.  HENT:  Head: Normocephalic and atraumatic.  Right Ear: Hearing normal.  Left Ear: Hearing normal.  Nose: Nose normal.  Eyes: Conjunctivae and lids are normal. Right eye exhibits no discharge. Left eye exhibits no discharge. No scleral icterus.  Cardiovascular: Normal rate, regular rhythm and intact distal pulses.  Exam reveals no gallop and no friction rub.   No murmur heard. Pulmonary/Chest: Effort normal and breath sounds normal. No respiratory distress. She has no wheezes. She has no rales. She exhibits no tenderness.  Musculoskeletal: Normal range of motion.  Neurological: She is alert and oriented to person, place, and time.  Skin: Skin is warm, dry and intact. No rash noted. She is not diaphoretic. No erythema. No pallor.  Psychiatric: She has a normal mood and affect. Her speech is normal and behavior is normal. Judgment and thought content normal. Cognition and memory are normal.  Nursing note and vitals reviewed.   Results for orders placed or performed in visit on 08/05/15  IGP, Aptima HPV, rfx 16/18,45  Result Value Ref Range   DIAGNOSIS: Comment    Specimen adequacy: Comment    CLINICIAN PROVIDED ICD10: Comment    Performed by: Comment  PAP SMEAR COMMENT .    Note: Comment    Test Methodology Comment    HPV Aptima Negative Negative      Assessment & Plan:   Problem List Items Addressed This Visit      Musculoskeletal and Integument   Cervical osteoarthritis    PMP reviewed and appropriate. Has not gotten tramadol from anyone since May. Given it originally for CTS. Will give small amount of tramadol to be used only with extreme pain to bridge her until she gets into see neurosurgery. Call with concerns.       Relevant Medications   traMADol (ULTRAM) 50 MG tablet     Other   PTSD (post-traumatic stress disorder)    Continue  to follow with psychiatry. We will put another referral in if she finds someone closer who will see her.       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 Continue to monitor.        Other Visit Diagnoses    Reactive airway disease, unspecified asthma severity, uncomplicated    -  Primary   Will send in her in her nebulizer solution again. Clear lungs on exam today.   Family history of breast cancer in first degree relative       Information about GYN given to patient today.       Follow up plan: Return in about 4 months (around 02/03/2016) for follow up.

## 2015-10-04 NOTE — Assessment & Plan Note (Signed)
Continue to follow with psychiatry. We will put another referral in if she finds someone closer who will see her.

## 2015-10-04 NOTE — Patient Instructions (Signed)
[facility] [phone]

## 2015-11-07 ENCOUNTER — Ambulatory Visit: Payer: Medicaid Other | Admitting: Family Medicine

## 2015-11-07 ENCOUNTER — Telehealth: Payer: Self-pay | Admitting: Family Medicine

## 2015-11-07 MED ORDER — NICOTINE POLACRILEX 4 MG MT LOZG: 4.0000 mg | LOZENGE | OROMUCOSAL | 3 refills | Status: DC | PRN

## 2015-11-07 NOTE — Telephone Encounter (Signed)
Called patient to let her know Rx was sent.- LVM

## 2015-11-07 NOTE — Telephone Encounter (Signed)
Pt called stated she would like to know if Dr. Wynetta Emery could prescribe some losanges to stop smoking. Pharm is [facility] in [place]. Thanks.

## 2015-11-07 NOTE — Telephone Encounter (Signed)
Rx sent to her pharmacy. I'm not sure insurance will pay for it.

## 2015-11-18 ENCOUNTER — Telehealth: Payer: Self-pay

## 2015-11-18 MED ORDER — NICOTINE 14 MG/24HR TD PT24: 14.0000 mg | MEDICATED_PATCH | Freq: Every day | TRANSDERMAL | 0 refills | Status: DC

## 2015-11-18 NOTE — Telephone Encounter (Signed)
Patient would like a refill on her Nicotine patches 14mg  sent to [facility].

## 2016-01-20 ENCOUNTER — Other Ambulatory Visit: Payer: Self-pay | Admitting: Family Medicine

## 2016-01-20 NOTE — Telephone Encounter (Signed)
Can you find out if she has enough to get to her appointment or she needs a refill before that.

## 2016-01-21 ENCOUNTER — Telehealth: Payer: Self-pay | Admitting: Family Medicine

## 2016-01-21 NOTE — Telephone Encounter (Addendum)
Called Becky Hubbard to let her know about the new law regarding pain medicine. LMOM for her to call back. Please let her know if she calls back that  I can give her 2 weeks of her medicine, but I can't give her more than that. I'll get her a referral to see the pain doctor if she needs the medicine longer, otherwise we'll have to stop it. 2 weeks of meds sent to her pharmacy.

## 2016-01-21 NOTE — Telephone Encounter (Signed)
Left a message for patient to return my call. 

## 2016-01-21 NOTE — Telephone Encounter (Signed)
Patient needs a refill before her appointment

## 2016-01-29 ENCOUNTER — Other Ambulatory Visit: Payer: Self-pay | Admitting: Family Medicine

## 2016-02-06 ENCOUNTER — Ambulatory Visit
Admission: RE | Admit: 2016-02-06 | Discharge: 2016-02-06 | Disposition: A | Discharge status: Home / Self Care | Payer: Medicaid Other | Source: Ambulatory Visit | Attending: Family Medicine | Admitting: Family Medicine

## 2016-02-06 ENCOUNTER — Ambulatory Visit (INDEPENDENT_AMBULATORY_CARE_PROVIDER_SITE_OTHER): Payer: Medicaid Other | Admitting: Family Medicine

## 2016-02-06 ENCOUNTER — Encounter: Payer: Self-pay | Admitting: Family Medicine

## 2016-02-06 ENCOUNTER — Telehealth: Payer: Self-pay

## 2016-02-06 VITALS — BP 123/82 | HR 102 | Temp 98.4°F | Wt 201.9 lb

## 2016-02-06 DIAGNOSIS — M47812 Spondylosis without myelopathy or radiculopathy, cervical region: Secondary | ICD-10-CM

## 2016-02-06 DIAGNOSIS — M25551 Pain in right hip: Secondary | ICD-10-CM | POA: Insufficient documentation

## 2016-02-06 DIAGNOSIS — R062 Wheezing: Secondary | ICD-10-CM

## 2016-02-06 DIAGNOSIS — K30 Functional dyspepsia: Secondary | ICD-10-CM

## 2016-02-06 MED ORDER — AZITHROMYCIN 250 MG PO TABS: ORAL_TABLET | ORAL | 0 refills | Status: DC

## 2016-02-06 MED ORDER — PANTOPRAZOLE SODIUM 40 MG PO TBEC: 40.0000 mg | DELAYED_RELEASE_TABLET | Freq: Every day | ORAL | 1 refills | Status: DC

## 2016-02-06 MED ORDER — ALBUTEROL SULFATE HFA 108 (90 BASE) MCG/ACT IN AERS: 2.0000 | INHALATION_SPRAY | Freq: Four times a day (QID) | RESPIRATORY_TRACT | 1 refills | Status: DC | PRN

## 2016-02-06 NOTE — Progress Notes (Signed)
BP 123/82 (BP Location: Left Arm, Patient Position: Sitting, Cuff Size: Normal)   Pulse (!) 102   Temp 98.4 F (36.9 C)   Wt 201 lb 14.4 oz (91.6 kg)   LMP 01/23/2016 (Approximate)   SpO2 100%   BMI 34.02 kg/m    Subjective:    Patient ID: Becky Hubbard, female    DOB: 12-26-1970, 45 y.o.   MRN: [id number]  HPI: Becky Hubbard is a 45 y.o. female  Chief Complaint  Patient presents with  . Gastroesophageal Reflux   Notes that she has had bronchitis 2x for the past 2-3 months, has not been feeling well. She notes that they started her on proventil and she has been having issues with her breathing.   Has been having trouble with her throat and was started on zantac and omeprazole 20 but it doesn't seem to be helping. She notes that she is still having a lot of trouble with her throat and feeling hoarse. She notes that she coughs at night and that she has had some issues with her voice.   She has been seeing the psychiatrist at [facility] and is concerned that she may be gaining weight on the Risperdal. Likes him better now and is comfortable with seeing him. She is interested in seeing another psychiatrist in [place], needs to get her medicaid card changed so that she can see them.   HIP PAIN- [place] off her bed about 2 days ago. Rolled over and fell out of bed. Fell hard on her R hip.  Duration: 2 days Involved hip: right  Mechanism of injury: trauma Location: posterior Onset: sudden  Severity: severe  Quality: aching Frequency: constant Radiation: no Aggravating factors: driving and prolonged sitting   Alleviating factors: nothing   Status: worse Treatments attempted: BCs and heat and tramadol   Relief with NSAIDs?: No NSAIDs Taken Weakness with weight bearing: no Weakness with walking: no Paresthesias / decreased sensation: no Swelling: no Redness:no Fevers: no  Relevant past medical, surgical, family and social history reviewed and updated as indicated. Interim medical history  since our last visit reviewed. Allergies and medications reviewed and updated.  Review of Systems  Constitutional: Negative.   HENT: Positive for dental problem.   Respiratory: Negative.   Cardiovascular: Negative.   Gastrointestinal: Positive for abdominal pain. Negative for abdominal distention, anal bleeding, blood in stool, constipation, diarrhea, nausea, rectal pain and vomiting.  Musculoskeletal: Positive for arthralgias. Negative for back pain, gait problem, joint swelling, myalgias, neck pain and neck stiffness.  Psychiatric/Behavioral: Negative.     Per HPI unless specifically indicated above     Objective:    BP 123/82 (BP Location: Left Arm, Patient Position: Sitting, Cuff Size: Normal)   Pulse (!) 102   Temp 98.4 F (36.9 C)   Wt 201 lb 14.4 oz (91.6 kg)   LMP 01/23/2016 (Approximate)   SpO2 100%   BMI 34.02 kg/m   Wt Readings from Last 3 Encounters:  02/06/16 201 lb 14.4 oz (91.6 kg)  10/04/15 191 lb (86.6 kg)  08/05/15 187 lb (84.8 kg)    Physical Exam  Constitutional: She is oriented to person, place, and time. She appears well-developed and well-nourished. No distress.  HENT:  Head: Normocephalic and atraumatic.  Right Ear: Hearing normal.  Left Ear: Hearing normal.  Nose: Nose normal.  Eyes: Conjunctivae and lids are normal. Right eye exhibits no discharge. Left eye exhibits no discharge. No scleral icterus.  Cardiovascular: Normal rate, regular rhythm, normal  heart sounds and intact distal pulses.  Exam reveals no gallop and no friction rub.   No murmur heard. Pulmonary/Chest: Effort normal and breath sounds normal. No respiratory distress. She has no wheezes. She has no rales. She exhibits no tenderness.  Abdominal: Soft. Bowel sounds are normal. She exhibits no distension and no mass. There is no tenderness. There is no rebound and no guarding.  Neurological: She is alert and oriented to person, place, and time.  Skin: Skin is warm, dry and intact. No  rash noted. She is not diaphoretic. No erythema. No pallor.  Psychiatric: She has a normal mood and affect. Her speech is normal and behavior is normal. Judgment and thought content normal. Cognition and memory are normal.  Nursing note and vitals reviewed. Hip Exam: Right Tenderness to palpation:      Greater trochanter: no      Anterior superior iliac spine: no     Anterior hip: no     Iliac crest: yes     Iliac tubercle: no     Pubic tubercle: no     SI joint: no  Range of Motion: Full ROM Muscle Strength:  5/5 bilaterally     Results for orders placed or performed in visit on 08/05/15  IGP, Aptima HPV, rfx 16/18,45  Result Value Ref Range   DIAGNOSIS: Comment    Specimen adequacy: Comment    CLINICIAN PROVIDED ICD10: Comment    Performed by: Comment    PAP SMEAR COMMENT .    Note: Comment    Test Methodology Comment    HPV Aptima Negative Negative      Assessment & Plan:   Problem List Items Addressed This Visit      Musculoskeletal and Integument   Cervical osteoarthritis    Patient would like to see [facility] neurosurgery. Referral generated today. Advised her that given the new law we aren't doing long-term opiods for medicaid patients. She voiced understanding.       Relevant Orders   Ambulatory referral to Neurosurgery     Other   Acid indigestion    Will change her omeprazole to protonix as it has been ineffective. Call with any concerns. Recheck about 1 month.        Other Visit Diagnoses    Pain of right hip joint    -  Primary   Fall 2 days ago. Likely just contusion. Symptomatic treatment. Will obtain x-ray. Await results.    Relevant Orders   DG HIP UNILAT WITH PELVIS MIN 4 VIEWS RIGHT   Wheeze       Will obtain CXR- treat with azithromycin and albuterol. Recheck 2 weeks with spiro.    Relevant Orders   DG Chest 2V REPEAT Same day       Follow up plan: Return in about 2 weeks (around 02/20/2016) for lung recheck with spiro.

## 2016-02-06 NOTE — Assessment & Plan Note (Signed)
Patient would like to see [facility] neurosurgery. Referral generated today. Advised her that given the new law we aren't doing long-term opiods for medicaid patients. She voiced understanding.

## 2016-02-06 NOTE — Assessment & Plan Note (Signed)
Will change her omeprazole to protonix as it has been ineffective. Call with any concerns. Recheck about 1 month.

## 2016-02-06 NOTE — Telephone Encounter (Signed)
Pharmacy called to make sure that it was ok to fill the z -pac because there is an interaction between that and the risperdal.  Verbal ok from Becky Hubbard.

## 2016-02-07 ENCOUNTER — Telehealth: Payer: Self-pay | Admitting: Family Medicine

## 2016-02-07 NOTE — Telephone Encounter (Signed)
Message relayed to patient. Verbalized understanding and denied questions.   

## 2016-02-07 NOTE — Telephone Encounter (Signed)
Please let her know that her hip and chest x-rays came back normal. I'd like her to take the medicine we gave her yesterday and keep some ice on her hip, as it's probably just a deep bruise. We'll see her in 2 weeks and do that breathing test

## 2016-02-11 ENCOUNTER — Inpatient Hospital Stay
Admission: EM | Admit: 2016-02-11 | Discharge: 2016-02-14 | DRG: 193 | Disposition: A | Discharge status: Home Health | Payer: Medicaid Other | Attending: Specialist | Admitting: Specialist

## 2016-02-11 ENCOUNTER — Emergency Department: Payer: Medicaid Other

## 2016-02-11 ENCOUNTER — Encounter: Payer: Self-pay | Admitting: Family Medicine

## 2016-02-11 ENCOUNTER — Encounter: Payer: Self-pay | Admitting: Medical Oncology

## 2016-02-11 ENCOUNTER — Ambulatory Visit: Payer: Self-pay | Admitting: Family Medicine

## 2016-02-11 ENCOUNTER — Ambulatory Visit (INDEPENDENT_AMBULATORY_CARE_PROVIDER_SITE_OTHER): Payer: Medicaid Other | Admitting: Family Medicine

## 2016-02-11 VITALS — BP 115/81 | HR 122 | Temp 100.8°F | Wt 203.8 lb

## 2016-02-11 DIAGNOSIS — Z8249 Family history of ischemic heart disease and other diseases of the circulatory system: Secondary | ICD-10-CM | POA: Diagnosis not present

## 2016-02-11 DIAGNOSIS — E871 Hypo-osmolality and hyponatremia: Secondary | ICD-10-CM | POA: Diagnosis present

## 2016-02-11 DIAGNOSIS — F17210 Nicotine dependence, cigarettes, uncomplicated: Secondary | ICD-10-CM | POA: Diagnosis present

## 2016-02-11 DIAGNOSIS — M797 Fibromyalgia: Secondary | ICD-10-CM | POA: Diagnosis present

## 2016-02-11 DIAGNOSIS — A419 Sepsis, unspecified organism: Secondary | ICD-10-CM

## 2016-02-11 DIAGNOSIS — F339 Major depressive disorder, recurrent, unspecified: Secondary | ICD-10-CM | POA: Diagnosis present

## 2016-02-11 DIAGNOSIS — Z79899 Other long term (current) drug therapy: Secondary | ICD-10-CM

## 2016-02-11 DIAGNOSIS — J9601 Acute respiratory failure with hypoxia: Secondary | ICD-10-CM | POA: Diagnosis present

## 2016-02-11 DIAGNOSIS — K219 Gastro-esophageal reflux disease without esophagitis: Secondary | ICD-10-CM | POA: Diagnosis present

## 2016-02-11 DIAGNOSIS — Z833 Family history of diabetes mellitus: Secondary | ICD-10-CM | POA: Diagnosis not present

## 2016-02-11 DIAGNOSIS — R739 Hyperglycemia, unspecified: Secondary | ICD-10-CM | POA: Diagnosis present

## 2016-02-11 DIAGNOSIS — Z7982 Long term (current) use of aspirin: Secondary | ICD-10-CM

## 2016-02-11 DIAGNOSIS — J440 Chronic obstructive pulmonary disease with acute lower respiratory infection: Secondary | ICD-10-CM | POA: Diagnosis present

## 2016-02-11 DIAGNOSIS — R062 Wheezing: Secondary | ICD-10-CM

## 2016-02-11 DIAGNOSIS — R300 Dysuria: Secondary | ICD-10-CM | POA: Diagnosis present

## 2016-02-11 DIAGNOSIS — Z803 Family history of malignant neoplasm of breast: Secondary | ICD-10-CM | POA: Diagnosis not present

## 2016-02-11 DIAGNOSIS — Z8261 Family history of arthritis: Secondary | ICD-10-CM

## 2016-02-11 DIAGNOSIS — J441 Chronic obstructive pulmonary disease with (acute) exacerbation: Secondary | ICD-10-CM | POA: Diagnosis present

## 2016-02-11 DIAGNOSIS — J189 Pneumonia, unspecified organism: Secondary | ICD-10-CM | POA: Diagnosis present

## 2016-02-11 LAB — CBC WITH DIFFERENTIAL/PLATELET
Basophils Absolute: 0 10*3/uL (ref 0–0.1)
Basophils Relative: 0 %
Eosinophils Absolute: 0 10*3/uL (ref 0–0.7)
Eosinophils Relative: 0 %
HCT: 37.1 % (ref 35.0–47.0)
Hemoglobin: 12.5 g/dL (ref 12.0–16.0)
Lymphocytes Relative: 9 %
Lymphs Abs: 0.5 10*3/uL — ABNORMAL LOW (ref 1.0–3.6)
MCH: 30 pg (ref 26.0–34.0)
MCHC: 33.8 g/dL (ref 32.0–36.0)
MCV: 88.7 fL (ref 80.0–100.0)
Monocytes Absolute: 0.8 10*3/uL (ref 0.2–0.9)
Monocytes Relative: 14 %
Neutro Abs: 4.3 10*3/uL (ref 1.4–6.5)
Neutrophils Relative %: 77 %
Platelets: 208 10*3/uL (ref 150–440)
RBC: 4.19 MIL/uL (ref 3.80–5.20)
RDW: 14.2 % (ref 11.5–14.5)
WBC: 5.6 10*3/uL (ref 3.6–11.0)

## 2016-02-11 LAB — COMPREHENSIVE METABOLIC PANEL
ALT: 15 U/L (ref 14–54)
AST: 26 U/L (ref 15–41)
Albumin: 4 g/dL (ref 3.5–5.0)
Alkaline Phosphatase: 37 U/L — ABNORMAL LOW (ref 38–126)
Anion gap: 7 (ref 5–15)
BUN: 7 mg/dL (ref 6–20)
CO2: 30 mmol/L (ref 22–32)
Calcium: 8 mg/dL — ABNORMAL LOW (ref 8.9–10.3)
Chloride: 97 mmol/L — ABNORMAL LOW (ref 101–111)
Creatinine, Ser: 0.76 mg/dL (ref 0.44–1.00)
GFR calc Af Amer: >60 mL/min (ref >60)
GFR calc non Af Amer: >60 mL/min (ref >60)
Glucose, Bld: 120 mg/dL — ABNORMAL HIGH (ref 65–99)
Potassium: 3.2 mmol/L — ABNORMAL LOW (ref 3.5–5.1)
Sodium: 134 mmol/L — ABNORMAL LOW (ref 135–145)
Total Bilirubin: 0.6 mg/dL (ref 0.3–1.2)
Total Protein: 7.3 g/dL (ref 6.5–8.1)

## 2016-02-11 LAB — BLOOD GAS, VENOUS
Acid-Base Excess: 5.3 mmol/L — ABNORMAL HIGH (ref 0.0–2.0)
Bicarbonate: 32.6 mmol/L — ABNORMAL HIGH (ref 20.0–28.0)
Patient temperature: 37
pCO2, Ven: 59 mmHg (ref 44.0–60.0)
pH, Ven: 7.35 (ref 7.250–7.430)
pO2, Ven: <31 mmHg — CL (ref 32.0–45.0)

## 2016-02-11 LAB — INFLUENZA PANEL BY PCR (TYPE A & B)
Influenza A By PCR: NEGATIVE
Influenza B By PCR: NEGATIVE

## 2016-02-11 LAB — LACTIC ACID, PLASMA: Lactic Acid, Venous: 1.1 mmol/L (ref 0.5–1.9)

## 2016-02-11 MED ORDER — METHYLPREDNISOLONE SODIUM SUCC 40 MG IJ SOLR
40.0000 mg | Freq: Four times a day (QID) | INTRAMUSCULAR | Status: DC
  Administered 2016-02-11 – 2016-02-13 (×8): 40 mg via INTRAVENOUS
  Filled 2016-02-11 (×8): qty 1

## 2016-02-11 MED ORDER — PANTOPRAZOLE SODIUM 40 MG PO TBEC
40.0000 mg | DELAYED_RELEASE_TABLET | Freq: Every day | ORAL | Status: DC
  Administered 2016-02-12 – 2016-02-14 (×3): 40 mg via ORAL
  Filled 2016-02-11 (×3): qty 1

## 2016-02-11 MED ORDER — TRAMADOL HCL 50 MG PO TABS
50.0000 mg | ORAL_TABLET | Freq: Two times a day (BID) | ORAL | Status: DC | PRN
  Administered 2016-02-12: 50 mg via ORAL
  Filled 2016-02-11: qty 1

## 2016-02-11 MED ORDER — GUAIFENESIN-DM 100-10 MG/5ML PO SYRP
5.0000 mL | ORAL_SOLUTION | ORAL | Status: DC | PRN
  Administered 2016-02-12 – 2016-02-14 (×4): 5 mL via ORAL
  Filled 2016-02-11 (×4): qty 5

## 2016-02-11 MED ORDER — ENOXAPARIN SODIUM 40 MG/0.4ML ~~LOC~~ SOLN
40.0000 mg | SUBCUTANEOUS | Status: DC
  Administered 2016-02-11 – 2016-02-13 (×3): 40 mg via SUBCUTANEOUS
  Filled 2016-02-11 (×3): qty 0.4

## 2016-02-11 MED ORDER — CEFEPIME-DEXTROSE 1 GM/50ML IV SOLR
1.0000 g | Freq: Once | INTRAVENOUS | Status: AC
  Administered 2016-02-11: 1 g via INTRAVENOUS
  Filled 2016-02-11: qty 50

## 2016-02-11 MED ORDER — BUDESONIDE 0.5 MG/2ML IN SUSP
0.5000 mg | Freq: Two times a day (BID) | RESPIRATORY_TRACT | Status: DC
  Administered 2016-02-11 – 2016-02-14 (×6): 0.5 mg via RESPIRATORY_TRACT
  Filled 2016-02-11 (×6): qty 2

## 2016-02-11 MED ORDER — SODIUM CHLORIDE 0.9 % IV BOLUS (SEPSIS)
1000.0000 mL | Freq: Once | INTRAVENOUS | Status: AC
  Administered 2016-02-11: 1000 mL via INTRAVENOUS

## 2016-02-11 MED ORDER — RISPERIDONE 1 MG PO TABS
1.0000 mg | ORAL_TABLET | Freq: Every day | ORAL | Status: DC
  Administered 2016-02-11 – 2016-02-13 (×3): 1 mg via ORAL
  Filled 2016-02-11 (×3): qty 1

## 2016-02-11 MED ORDER — ACETAMINOPHEN 325 MG PO TABS: 650.0000 mg | ORAL_TABLET | Freq: Four times a day (QID) | ORAL | Status: DC | PRN

## 2016-02-11 MED ORDER — ALBUTEROL (5 MG/ML) CONTINUOUS INHALATION SOLN
5.0000 mg/h | INHALATION_SOLUTION | Freq: Once | RESPIRATORY_TRACT | Status: DC
  Filled 2016-02-11: qty 20

## 2016-02-11 MED ORDER — ASPIRIN EC 81 MG PO TBEC
81.0000 mg | DELAYED_RELEASE_TABLET | Freq: Every day | ORAL | Status: DC
  Administered 2016-02-11 – 2016-02-14 (×4): 81 mg via ORAL
  Filled 2016-02-11 (×4): qty 1

## 2016-02-11 MED ORDER — IPRATROPIUM-ALBUTEROL 0.5-2.5 (3) MG/3ML IN SOLN
3.0000 mL | Freq: Once | RESPIRATORY_TRACT | Status: AC
  Administered 2016-02-11: 3 mL via RESPIRATORY_TRACT
  Filled 2016-02-11: qty 3

## 2016-02-11 MED ORDER — CYCLOBENZAPRINE HCL 10 MG PO TABS: 10.0000 mg | ORAL_TABLET | Freq: Three times a day (TID) | ORAL | Status: DC | PRN

## 2016-02-11 MED ORDER — METHYLPREDNISOLONE SODIUM SUCC 125 MG IJ SOLR
125.0000 mg | Freq: Once | INTRAMUSCULAR | Status: AC
  Administered 2016-02-11: 125 mg via INTRAVENOUS
  Filled 2016-02-11: qty 2

## 2016-02-11 MED ORDER — IPRATROPIUM-ALBUTEROL 0.5-2.5 (3) MG/3ML IN SOLN
3.0000 mL | Freq: Four times a day (QID) | RESPIRATORY_TRACT | Status: DC
  Administered 2016-02-11 – 2016-02-12 (×4): 3 mL via RESPIRATORY_TRACT
  Filled 2016-02-11 (×4): qty 3

## 2016-02-11 MED ORDER — LORATADINE 10 MG PO TABS
10.0000 mg | ORAL_TABLET | Freq: Every day | ORAL | Status: DC
  Administered 2016-02-12 – 2016-02-14 (×3): 10 mg via ORAL
  Filled 2016-02-11 (×3): qty 1

## 2016-02-11 MED ORDER — FLUTICASONE PROPIONATE 50 MCG/ACT NA SUSP
2.0000 | Freq: Every day | NASAL | Status: DC
  Administered 2016-02-11 – 2016-02-14 (×4): 2 via NASAL
  Filled 2016-02-11: qty 16

## 2016-02-11 MED ORDER — SUCRALFATE 1 G PO TABS
1.0000 g | ORAL_TABLET | Freq: Four times a day (QID) | ORAL | Status: DC
  Administered 2016-02-11 – 2016-02-14 (×10): 1 g via ORAL
  Filled 2016-02-11 (×10): qty 1

## 2016-02-11 MED ORDER — ACETAMINOPHEN 500 MG PO TABS
1000.0000 mg | ORAL_TABLET | Freq: Once | ORAL | Status: AC
  Administered 2016-02-11: 1000 mg via ORAL
  Filled 2016-02-11: qty 2

## 2016-02-11 MED ORDER — VITAMIN D 1000 UNITS PO TABS
1000.0000 [IU] | ORAL_TABLET | Freq: Every day | ORAL | Status: DC
  Administered 2016-02-11 – 2016-02-14 (×4): 1000 [IU] via ORAL
  Filled 2016-02-11 (×4): qty 1

## 2016-02-11 MED ORDER — ONDANSETRON HCL 4 MG/2ML IJ SOLN: 4.0000 mg | Freq: Four times a day (QID) | INTRAMUSCULAR | Status: DC | PRN

## 2016-02-11 MED ORDER — ONDANSETRON HCL 4 MG PO TABS: 4.0000 mg | ORAL_TABLET | Freq: Four times a day (QID) | ORAL | Status: DC | PRN

## 2016-02-11 MED ORDER — NICOTINE 14 MG/24HR TD PT24
14.0000 mg | MEDICATED_PATCH | Freq: Every day | TRANSDERMAL | Status: DC
  Administered 2016-02-11 – 2016-02-14 (×4): 14 mg via TRANSDERMAL
  Filled 2016-02-11 (×4): qty 1

## 2016-02-11 MED ORDER — LEVOFLOXACIN IN D5W 750 MG/150ML IV SOLN
750.0000 mg | INTRAVENOUS | Status: DC
  Filled 2016-02-11: qty 150

## 2016-02-11 MED ORDER — AZITHROMYCIN 500 MG PO TABS
500.0000 mg | ORAL_TABLET | Freq: Once | ORAL | Status: AC
  Administered 2016-02-11: 500 mg via ORAL
  Filled 2016-02-11: qty 1

## 2016-02-11 MED ORDER — DULOXETINE HCL 60 MG PO CPEP
60.0000 mg | ORAL_CAPSULE | Freq: Every day | ORAL | Status: DC
  Administered 2016-02-12 – 2016-02-14 (×3): 60 mg via ORAL
  Filled 2016-02-11 (×3): qty 1

## 2016-02-11 MED ORDER — CLONAZEPAM 1 MG PO TABS: 1.0000 mg | ORAL_TABLET | Freq: Two times a day (BID) | ORAL | Status: DC | PRN

## 2016-02-11 MED ORDER — ALBUTEROL SULFATE (2.5 MG/3ML) 0.083% IN NEBU
INHALATION_SOLUTION | RESPIRATORY_TRACT | Status: AC
  Administered 2016-02-11: 5 mg
  Filled 2016-02-11: qty 6

## 2016-02-11 MED ORDER — ACETAMINOPHEN 650 MG RE SUPP: 650.0000 mg | Freq: Four times a day (QID) | RECTAL | Status: DC | PRN

## 2016-02-11 NOTE — ED Provider Notes (Signed)
[facility] Emergency Department Provider Note  ____________________________________________  Time seen: Approximately 12:34 PM  I have reviewed the triage vital signs and the nursing notes.   HISTORY  Chief Complaint Fever; Shortness of Breath; and Cough   HPI Becky Hubbard is a 46 y.o. female with a history of the fibromyalgia, GERD, gastroparesis, and PTSD who presents for evaluation of cough, fever, and malaise. Patient reports 3 days of cough productive of brown sputum, severe nausea, decreased by mouth intake, bodyaches, myalgias. She also endorses severe shortness of breath with minimal exertion and diffuse wheezing. Patient has not been on albuterol for many months. She also endorses a week of dysuria. Patient went to see her primary care doctor today and was found to be hypoxic so EMS was called. Patient was satting 89% which improved on 4 L nasal cannula. Patient is current smoker. Patient has taken her flu shot this season. She denies chest pain, abdominal pain, back pain, vomiting or diarrhea.  Past Medical History:  Diagnosis Date  . ADD (attention deficit disorder)   . Anxiety   . Arthritis   . Auditory hallucinations   . Carpal tunnel syndrome on both sides   . Cervical spine fracture ([place]) 2008   Closed with screw C2-3 [facility] Dr. Delilah Shan  . Degenerative joint disease of spine   . Depression   . Family history of adverse reaction to anesthesia    mom- slow to awaken  . Fibromyalgia   . Foot fracture, left   . Gastroparesis   . Gastroparesis   . GERD (gastroesophageal reflux disease)   . Hearing loss   . Herniated cervical disc   . History of prescription drug abuse ([place])   . Neuromuscular disorder (H[facility])    fibromyalgia/chronic fatique syndrome  . Neuropathy of foot   . OCD (obsessive compulsive disorder)   . 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 Plaque psoriasis   . PTSD (post-traumatic stress disorder)   . Ulcer [facility])     Patient Active Problem List   Diagnosis Date Noted  . Obsessive-compulsive disorder 01/17/2015  . Borderline diabetes 09/04/2014  . Depression, major, recurrent, moderate ([place]) 08/21/2014  . ADD (attention deficit disorder) 07/31/2014  . Dermatitis, eczematoid 07/31/2014  . Allergy to environmental factors 07/31/2014  . Disorder of nervous system 07/31/2014  . 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 Plaque psoriasis 07/31/2014  . Cicatrix 07/31/2014  . Avitaminosis D 07/31/2014  . Loss of feeling or sensation 06/04/2014  . Auditory hallucinations   . Herniated cervical disc   . PTSD (post-traumatic stress disorder)   . Nonspecific abnormal results of endocrine function study 04/19/2014  . Carpal tunnel syndrome 10/03/2012  . Acid indigestion 07/21/2012  . 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 Fibromyalgia syndrome 01/22/2012  . Fibrositis 01/22/2012  . Cervical osteoarthritis 03/10/2011    Past Surgical History:  Procedure Laterality Date  . CARPAL TUNNEL RELEASE Left 07/12/2015   Procedure: OPEN CARPAL TUNNEL RELEASE OF LEFT HAND;  Surgeon: Leanor Kail, MD;  Location: [place];  Service: Orthopedics;  Laterality: Left;  . cervical radiofrequency neurotomy    . FRACTURE SURGERY    . MOUTH SURGERY     upper and lower dentures  . NASAL SEPTUM SURGERY    . occipital nerve blocks    . SPINE SURGERY     cervical  Prior to Admission medications   Medication Sig Start Date End Date Taking? Authorizing Provider  albuterol (PROVENTIL HFA;VENTOLIN HFA) 108 (90 Base) MCG/ACT inhaler Inhale 2 puffs into the lungs every 6 (six) hours as needed for wheezing or shortness of breath. 02/06/16   Megan P Johnson, DO  aspirin 81 MG tablet Take 81 mg by mouth daily.    Historical Provider, MD  Cholecalciferol (VITAMIN D3) 1000 UNITS CAPS Take by mouth.    Historical Provider, MD    clonazePAM (KLONOPIN) 1 MG tablet TAKE ONE TABLET BY MOUTH TWICE DAILY AS NEEDED FOR ANXIETY AND A HALFTABLET AT BEDTIME AS NEEDED FOR ANXIETY 09/10/15   Megan P Johnson, DO  Cyanocobalamin (RA VITAMIN B-12 TR) 1000 MCG TBCR Take by mouth.    Historical Provider, MD  cyclobenzaprine (FLEXERIL) 10 MG tablet TAKE ONE TABLET BY MOUTH EVERY DAY AS NEEDED AND TAKE TWO TABLETS BY MOUTH AT BEDTIME *MUST LAST 30 DAYS* 07/18/15   Megan P Johnson, DO  DULoxetine (CYMBALTA) 60 MG capsule Take 1 capsule (60 mg total) by mouth daily. 07/04/15   Megan P Johnson, DO  fluticasone (FLONASE) 50 MCG/ACT nasal spray Place 2 sprays into both nostrils daily. 10/04/15   Megan P Johnson, DO  loratadine (CLARITIN) 10 MG tablet Take 1 tablet (10 mg total) by mouth daily. 03/14/15   Megan P Johnson, DO  mupirocin ointment (BACTROBAN) 2 % Apply daily to open wounds until clear. 07/13/13   Historical Provider, MD  nicotine (NICODERM CQ - DOSED IN MG/24 HOURS) 14 mg/24hr patch Place 1 patch (14 mg total) onto the skin daily. 11/18/15   Volney American, PA-C  nicotine polacrilex (EQL NICOTINE POLACRILEX) 4 MG lozenge Take 1 lozenge (4 mg total) by mouth as needed for smoking cessation. 11/07/15   Megan P Johnson, DO  omega-3 acid ethyl esters (LOVAZA) 1 g capsule Take by mouth.    Historical Provider, MD  pantoprazole (PROTONIX) 40 MG tablet Take 1 tablet (40 mg total) by mouth daily. 02/06/16   Megan P Johnson, DO  risperiDONE (RISPERDAL) 1 MG tablet Take 1 mg by mouth. 11/25/15   Historical Provider, MD  Scar Treatment Products [facility]) GEL Frequency:BID   Dosage:0.0     Instructions:  Note:Dose: SCAR 07/11/09   Historical Provider, MD  SM MULTIPLE VITAMINS/IRON TABS Take by mouth. 12/25/05   Historical Provider, MD  sucralfate (CARAFATE) 1 G tablet Take 1 g by mouth 4 (four) times daily.    Historical Provider, MD  traMADol (ULTRAM) 50 MG tablet TAKE ONE TABLET BY MOUTH EVERY 12 HOURS AS NEEDED 01/21/16   Megan P Johnson, DO     Allergies Doxycycline; Lyrica [pregabalin]; Neurontin [gabapentin]; Chantix [varenicline]; Other; Penicillin g; Penicillins; and Sulfa antibiotics  Family History  Problem Relation Age of Onset  . Cancer Mother 57    breast cancer  . Migraines Mother   . Hypertension Mother   . Arthritis Mother   . Sleep apnea Mother   . Anxiety disorder Mother   . Depression Mother   . Diabetes Father   . Cancer Father 13    of spine , pancreas and liver  . Anxiety disorder Father   . Depression Father   . Diabetes Brother   . Hyperlipidemia Brother   . Sleep apnea Brother   . Cancer Maternal Grandfather     leukemia, prostate  . Cancer Paternal Grandfather     bone  . Cancer Maternal Aunt     breast  .  Mental illness Maternal Grandmother     Dementia  . Stroke Paternal Grandmother   . Diabetes Brother   . Cancer Paternal Aunt     breast    Social History Social History  Substance Use Topics  . Smoking status: Current Every Day Smoker    Packs/day: 0.50    Years: 12.00    Types: Cigarettes    Start date: 03/01/2011  . Smokeless tobacco: Never Used  . Alcohol use No    Review of Systems  Constitutional: + fever, malaise, myalgias Eyes: Negative for visual changes. ENT: Negative for sore throat. Neck: No neck pain  Cardiovascular: Negative for chest pain. Respiratory: + cough, wheezing, SOB Gastrointestinal: Negative for abdominal pain, vomiting or diarrhea.+ nausea Genitourinary: + dysuria. Musculoskeletal: Negative for back pain. Skin: Negative for rash. Neurological: Negative for headaches, weakness or numbness. Psych: No SI or HI  ____________________________________________   PHYSICAL EXAM:  VITAL SIGNS: ED Triage Vitals  Enc Vitals Group     BP 02/11/16 1206 124/76     Pulse Rate 02/11/16 1206 (!) 116     Resp 02/11/16 1206 20     Temp 02/11/16 1206 (!) 101.3 F (38.5 C)     Temp Source 02/11/16 1206 Oral     SpO2 02/11/16 1206 97 %     Weight  02/11/16 1208 203 lb (92.1 kg)     Height 02/11/16 1208 5\' 5"  (1.651 m)     Head Circumference --      Peak Flow --      Pain Score 02/11/16 1208 8     Pain Loc --      Pain Edu? --      Excl. in [place]? --     Constitutional: Alert and oriented, looks unwell and dehydrated.  HEENT:      Head: Normocephalic and atraumatic.         Eyes: Conjunctivae are normal. Sclera is non-icteric. EOMI. PERRL      Mouth/Throat: Mucous membranes are dry.       Neck: Supple with no signs of meningismus. Cardiovascular: Tachycardic with regular rhythm. No murmurs, gallops, or rubs. 2+ symmetrical distal pulses are present in all extremities. No JVD. Respiratory: Increased work of breathing, hypoxic on room air, diffuse expiratory wheezes throughout  Gastrointestinal: Soft, non tender, and non distended with positive bowel sounds. No rebound or guarding. Genitourinary: No CVA tenderness. Musculoskeletal: Nontender with normal range of motion in all extremities. No edema, cyanosis, or erythema of extremities. Neurologic: Normal speech and language. Face is symmetric. Moving all extremities. No gross focal neurologic deficits are appreciated. Skin: Skin is warm, dry and intact. No rash noted. Psychiatric: Mood and affect are normal. Speech and behavior are normal.  ____________________________________________   LABS (all labs ordered are listed, but only abnormal results are displayed)  Labs Reviewed  CBC WITH DIFFERENTIAL/PLATELET - Abnormal; Notable for the following:       Result Value   Lymphs Abs 0.5 (*)    All other components within normal limits  COMPREHENSIVE METABOLIC PANEL - Abnormal; Notable for the following:    Sodium 134 (*)    Potassium 3.2 (*)    Chloride 97 (*)    Glucose, Bld 120 (*)    Calcium 8.0 (*)    Alkaline Phosphatase 37 (*)    All other components within normal limits  BLOOD GAS, VENOUS - Abnormal; Notable for the following:    pO2, Ven <31.0 (*)    Bicarbonate 32.6  (*)  Acid-Base Excess 5.3 (*)    All other components within normal limits  CULTURE, BLOOD (ROUTINE X 2)  CULTURE, BLOOD (ROUTINE X 2)  URINE CULTURE  LACTIC ACID, PLASMA  INFLUENZA PANEL BY PCR (TYPE A & B, H1N1)  LACTIC ACID, PLASMA  URINALYSIS, COMPLETE (UACMP) WITH MICROSCOPIC   ____________________________________________  EKG  ED ECG REPORT I, Rudene Re, the attending physician, personally viewed and interpreted this ECG.  Sinus tachycardia, rate of 113, normal intervals, normal axis, no ST elevations or depressions. ____________________________________________  RADIOLOGY  CXR: Airspace consolidation most consistent with pneumonia throughout portions of the left mid lower lung zones. Lungs elsewhere clear. Cardiac silhouette within normal limits  ____________________________________________   PROCEDURES  Procedure(s) performed: None Procedures Critical Care performed: yes  CRITICAL CARE Performed by: Rudene Re  ?  Total critical care time: 35 min  Critical care time was exclusive of separately billable procedures and treating other patients.  Critical care was necessary to treat or prevent imminent or life-threatening deterioration.  Critical care was time spent personally by me on the following activities: development of treatment plan with patient and/or surrogate as well as nursing, discussions with consultants, evaluation of patient's response to treatment, examination of patient, obtaining history from patient or surrogate, ordering and performing treatments and interventions, ordering and review of laboratory studies, ordering and review of radiographic studies, pulse oximetry and re-evaluation of patient's condition.  ____________________________________________   INITIAL IMPRESSION / ASSESSMENT AND PLAN / ED COURSE  46 y.o. female with a history of the fibromyalgia, GERD, gastroparesis, and PTSD who presents for evaluation of cough,  fever, malaise, wheezing, shortness of breath, and dysuria. Patient meets sepsis criteria or on arrival to the emergency room with heart rate of 116, temp of 101.3. Patient is hypoxic on room air and was placed on 4 L nasal cannula with improvement of her oxygenation. She looks dry on exam. Start patient on 3 duo nebs, Solu-Medrol, IV fluids, cefepime and azithromycin for possible UTI and pneumonia. We'll check a flu swab.  Clinical Course as of Feb 11 1408  Tue Feb 11, 2016  1402 CXr concerning for PNA. Normal lactate and WBC. Patient was given cefepime and azithromycin and will be admitted to the Hospitalist service for sepsis.  [CV]    Clinical Course User Index [CV] Rudene Re, MD    Pertinent labs & imaging results that were available during my care of the patient were reviewed by me and considered in my medical decision making (see chart for details).    ____________________________________________   FINAL CLINICAL IMPRESSION(S) / ED DIAGNOSES  Final diagnoses:  Sepsis, due to unspecified organism [facility])  Community acquired pneumonia, unspecified laterality      NEW MEDICATIONS STARTED DURING THIS VISIT:  New Prescriptions   No medications on file     Note:  This document was prepared using Dragon voice recognition software and may include unintentional dictation errors.    Rudene Re, MD 02/11/16 1410

## 2016-02-11 NOTE — ED Triage Notes (Addendum)
Pt has been having cough/wheezing/sob 2-3 months ago, was seen by PCP 4 days ago and given albuterol which has not helped. Pt reports she has been running fever off and on for a few days. Pt here via ems and when they arrived pt was 89% on RA and placed on 4L [place]

## 2016-02-11 NOTE — H&P (Signed)
[facility] NAME: Becky Hubbard    MR#:  [id number]  DATE OF BIRTH:  1970-09-21  DATE OF ADMISSION:  02/11/2016  PRIMARY CARE PHYSICIAN: Park Liter, DO   REQUESTING/REFERRING PHYSICIAN: Dr. Gonzella Lex  CHIEF COMPLAINT:   Chief Complaint  Patient presents with  . Fever  . Shortness of Breath  . Cough    HISTORY OF PRESENT ILLNESS:  Becky Hubbard  is a 46 y.o. female with a known history of ADHD, PTSD, fibromyalgia, gastroparesis, GERD, depression, anxiety, COPD with ongoing tobacco abuse, psoriasis who presents to the hospital due to shortness of breath cough and wheezing for the past few days status progressively gotten worse. Patient says that she's had wheezing and shortness of breath now for the past month, she was treated for acute bronchitis with Z-Pak but did not improve. She continued to have significant shortness of breath on minimal exertion and worsening wheezing with cough productive with gray/yellow sputum. She went to see her primary care physician who then referred her to the ER for admission. Patient admits to chills but no documented fever no nausea no vomiting poor by mouth intake, no abdominal pain, diarrhea, melena, hematochezia or any other associated symptoms presently.  PAST MEDICAL HISTORY:   Past Medical History:  Diagnosis Date  . ADD (attention deficit disorder)   . Anxiety   . Arthritis   . Auditory hallucinations   . Carpal tunnel syndrome on both sides   . Cervical spine fracture ([place]) 2008   Closed with screw C2-3 [facility] Dr. Delilah Shan  . Degenerative joint disease of spine   . Depression   . Family history of adverse reaction to anesthesia    mom- slow to awaken  . Fibromyalgia   . Foot fracture, left   . Gastroparesis   . Gastroparesis   . GERD (gastroesophageal reflux disease)   . Hearing loss   . Herniated cervical disc   . History of prescription drug abuse ([place])   . Neuromuscular disorder  ([facility])    fibromyalgia/chronic fatique syndrome  . Neuropathy of foot   . OCD (obsessive compulsive disorder)   . 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 Plaque psoriasis   . PTSD (post-traumatic stress disorder)   . Ulcer ([place])     PAST SURGICAL HISTORY:   Past Surgical History:  Procedure Laterality Date  . CARPAL TUNNEL RELEASE Left 07/12/2015   Procedure: OPEN CARPAL TUNNEL RELEASE OF LEFT HAND;  Surgeon: Leanor Kail, MD;  Location: [facility];  Service: Orthopedics;  Laterality: Left;  . cervical radiofrequency neurotomy    . FRACTURE SURGERY    . MOUTH SURGERY     upper and lower dentures  . NASAL SEPTUM SURGERY    . occipital nerve blocks    . SPINE SURGERY     cervical    SOCIAL HISTORY:   Social History  Substance Use Topics  . Smoking status: Current Every Day Smoker    Packs/day: 1.00    Years: 20.00    Types: Cigarettes    Start date: 03/01/2011  . Smokeless tobacco: Never Used  . Alcohol use No    FAMILY HISTORY:   Family History  Problem Relation Age of Onset  . Cancer Mother 63    breast cancer  . Migraines Mother   . Hypertension  Mother   . Arthritis Mother   . Sleep apnea Mother   . Anxiety disorder Mother   . Depression Mother   . Diabetes Father   . Cancer Father 53    of spine , pancreas and liver  . Anxiety disorder Father   . Depression Father   . Diabetes Brother   . Hyperlipidemia Brother   . Sleep apnea Brother   . Cancer Maternal Grandfather     leukemia, prostate  . Cancer Paternal Grandfather     bone  . Cancer Maternal Aunt     breast  . Mental illness Maternal Grandmother     Dementia  . Stroke Paternal Grandmother   . Diabetes Brother   . Cancer Paternal Aunt     breast    DRUG ALLERGIES:   Allergies  Allergen Reactions  . Doxycycline   . Lyrica [Pregabalin] Swelling    Ankles swell and  turn red  . Neurontin [Gabapentin]   . Chantix [Varenicline] Rash  . Other Other (See Comments)    Cats - cough/sneezing  . Penicillin G Hives  . Penicillins Hives    Has patient had a PCN reaction causing immediate rash, facial/tongue/throat swelling, SOB or lightheadedness with hypotension: No Has patient had a PCN reaction causing severe rash involving mucus membranes or skin necrosis: No Has patient had a PCN reaction that required hospitalization No Has patient had a PCN reaction occurring within the last 10 years: No If all of the above answers are "NO", then may proceed with Cephalosporin use.   . Sulfa Antibiotics Hives    REVIEW OF SYSTEMS:   Review of Systems  Constitutional: Negative for fever and weight loss.  HENT: Negative for congestion, nosebleeds and tinnitus.   Eyes: Negative for blurred vision, double vision and redness.  Respiratory: Positive for cough, sputum production, shortness of breath and wheezing. Negative for hemoptysis.   Cardiovascular: Negative for chest pain, orthopnea, leg swelling and PND.  Gastrointestinal: Negative for abdominal pain, diarrhea, melena, nausea and vomiting.  Genitourinary: Negative for dysuria, hematuria and urgency.  Musculoskeletal: Negative for falls and joint pain.  Neurological: Negative for dizziness, tingling, sensory change, focal weakness, seizures, weakness and headaches.  Endo/Heme/Allergies: Negative for polydipsia. Does not bruise/bleed easily.  Psychiatric/Behavioral: Negative for depression and memory loss. The patient is not nervous/anxious.     MEDICATIONS AT HOME:   Prior to Admission medications   Medication Sig Start Date End Date Taking? Authorizing Provider  albuterol (PROVENTIL HFA;VENTOLIN HFA) 108 (90 Base) MCG/ACT inhaler Inhale 2 puffs into the lungs every 6 (six) hours as needed for wheezing or shortness of breath. 02/06/16  Yes Megan P Johnson, DO  aspirin 81 MG tablet Take 81 mg by mouth daily.    Yes Historical Provider, MD  Cholecalciferol (VITAMIN D3) 1000 UNITS CAPS Take by mouth.   Yes Historical Provider, MD  clonazePAM (KLONOPIN) 1 MG tablet TAKE ONE TABLET BY MOUTH TWICE DAILY AS NEEDED FOR ANXIETY AND A HALFTABLET AT BEDTIME AS NEEDED FOR ANXIETY 09/10/15  Yes Megan P Johnson, DO  Cyanocobalamin (RA VITAMIN B-12 TR) 1000 MCG TBCR Take by mouth.   Yes Historical Provider, MD  cyclobenzaprine (FLEXERIL) 10 MG tablet TAKE ONE TABLET BY MOUTH EVERY DAY AS NEEDED AND TAKE TWO TABLETS BY MOUTH AT BEDTIME *MUST LAST 30 DAYS* 07/18/15  Yes Megan P Johnson, DO  DULoxetine (CYMBALTA) 60 MG capsule Take 1 capsule (60 mg total) by mouth daily. 07/04/15  Yes Merrillan, DO  fluticasone (FLONASE) 50 MCG/ACT nasal spray Place 2 sprays into both nostrils daily. 10/04/15  Yes Megan P Johnson, DO  loratadine (CLARITIN) 10 MG tablet Take 1 tablet (10 mg total) by mouth daily. 03/14/15  Yes Megan P Johnson, DO  mupirocin ointment (BACTROBAN) 2 % Apply daily to open wounds until clear. 07/13/13  Yes Historical Provider, MD  nicotine (NICODERM CQ - DOSED IN MG/24 HOURS) 14 mg/24hr patch Place 1 patch (14 mg total) onto the skin daily. 11/18/15  Yes Volney American, PA-C  nicotine polacrilex (EQL NICOTINE POLACRILEX) 4 MG lozenge Take 1 lozenge (4 mg total) by mouth as needed for smoking cessation. 11/07/15  Yes Megan P Johnson, DO  omega-3 acid ethyl esters (LOVAZA) 1 g capsule Take by mouth.   Yes Historical Provider, MD  pantoprazole (PROTONIX) 40 MG tablet Take 1 tablet (40 mg total) by mouth daily. 02/06/16  Yes Megan P Johnson, DO  risperiDONE (RISPERDAL) 1 MG tablet Take 1 mg by mouth at bedtime.  11/25/15  Yes Historical Provider, MD  SM MULTIPLE VITAMINS/IRON TABS Take by mouth. 12/25/05  Yes Historical Provider, MD  sucralfate (CARAFATE) 1 G tablet Take 1 g by mouth 4 (four) times daily.   Yes Historical Provider, MD  traMADol (ULTRAM) 50 MG tablet TAKE ONE TABLET BY MOUTH EVERY 12 HOURS AS  NEEDED Patient taking differently: TAKE ONE TABLET BY MOUTH three times a day 01/21/16  Yes Megan P Johnson, DO      VITAL SIGNS:  Blood pressure 133/88, pulse (!) 114, temperature (!) 101.3 F (38.5 C), temperature source Oral, resp. rate (!) 28, height 5\' 5"  (1.651 m), weight 92.1 kg (203 lb), last menstrual period 01/23/2016, SpO2 91 %.  PHYSICAL EXAMINATION:  Physical Exam  GENERAL:  46 y.o.-year-old patient lying in the bed in mild resp. Distress.   EYES: Pupils equal, round, reactive to light and accommodation. No scleral icterus. Extraocular muscles intact.  HEENT: Head atraumatic, normocephalic. Oropharynx and nasopharynx clear. No oropharyngeal erythema, moist oral mucosa  NECK:  Supple, no jugular venous distention. No thyroid enlargement, no tenderness.  LUNGS: diffuse wheezing, rhonchi b/l.  + use of accessory muscles.  Poor Resp. Effort.  CARDIOVASCULAR: S1, S2 RRR. No murmurs, rubs, gallops, clicks.  ABDOMEN: Soft, nontender, nondistended. Bowel sounds present. No organomegaly or mass.  EXTREMITIES: No pedal edema, cyanosis, or clubbing. + 2 pedal & radial pulses b/l.   NEUROLOGIC: Cranial nerves II through XII are intact. No focal Motor or sensory deficits appreciated b/l PSYCHIATRIC: The patient is alert and oriented x 3. Good affect.  SKIN: No obvious rash, lesion, or ulcer.   LABORATORY PANEL:   CBC  Recent Labs Lab 02/11/16 1242  WBC 5.6  HGB 12.5  HCT 37.1  PLT 208   ------------------------------------------------------------------------------------------------------------------  Chemistries   Recent Labs Lab 02/11/16 1242  NA 134*  K 3.2*  CL 97*  CO2 30  GLUCOSE 120*  BUN 7  CREATININE 0.76  CALCIUM 8.0*  AST 26  ALT 15  ALKPHOS 37*  BILITOT 0.6   ------------------------------------------------------------------------------------------------------------------  Cardiac Enzymes No results for input(s): TROPONINI in the last 168  hours. ------------------------------------------------------------------------------------------------------------------  RADIOLOGY:  Dg Chest Portable 1 View  Result Date: 02/11/2016 CLINICAL DATA:  Shortness of breath with cough and wheezing EXAM: PORTABLE CHEST 1 VIEW COMPARISON:  February 06, 2016 FINDINGS: There is airspace consolidation throughout much of the left mid lower lung zones. Right lung is clear. Heart size and pulmonary vascularity are normal. No adenopathy. No  bone lesions. IMPRESSION: Airspace consolidation most consistent with pneumonia throughout portions of the left mid lower lung zones. Lungs elsewhere clear. Cardiac silhouette within normal limits. Followup PA and lateral chest radiographs recommended in 3-4 weeks following trial of antibiotic therapy to ensure resolution and exclude underlying malignancy. Electronically Signed   By: Lowella Grip III M.D.   On: 02/11/2016 13:30     IMPRESSION AND PLAN:   46 year old female with past medical history of fibromyalgia, PTSD, anxiety, depression, COPD with ongoing tobacco abuse, gastroparesis, psoriasis, ADHD who presented to the hospital due to shortness of breath, cough, wheezing.   1. Acute respiratory failure with hypoxia-secondary to COPD exacerbation with pneumonia. -I will treat patient with IV steroids, scheduled DuoNeb's, empiric antibiotics, continue O2 supplementation and follow clinically.  2. COPD exacerbation-secondary to pneumonia seen on chest x-ray. Patient noted to have left mid and lower lobe pneumonia. -I will treat the patient with IV Levaquin, follow blood, sputum cultures. -Place on IV steroids, scheduled DuoNeb nebs, Pulmicort nebs.  3. Pneumonia-community acquired. - place on IV Levaquin and follow cultures.    4. Depression - cont. Cymbalta.   5. GERD - cont. Protonix, Sucralfate  6. Anxiety - cont. Klonopin.    All the records are reviewed and case discussed with ED  provider. Management plans discussed with the patient, family and they are in agreement.  CODE STATUS: Full code   TOTAL TIME TAKING CARE OF THIS PATIENT: 45 minutes.    Henreitta Leber M.D on 02/11/2016 at 2:57 PM  Between 7am to 6pm - Pager - [phone]  After 6pm go to [email or web address] - password [email or web address] [facility]  Office  [phone]  CC: Primary care physician; Park Liter, DO

## 2016-02-11 NOTE — Progress Notes (Signed)
BP 115/81 (BP Location: Left Arm, Patient Position: Sitting, Cuff Size: Large)   Pulse (!) 122   Temp (!) 100.8 F (38.2 C)   Wt 203 lb 12.8 oz (92.4 kg)   LMP 01/23/2016 (Approximate)   SpO2 90%   BMI 34.34 kg/m    Subjective:    Patient ID: Becky Hubbard, female    DOB: 09/21/1970, 46 y.o.   MRN: [id number]  HPI: Becky Hubbard is a 46 y.o. female  Chief Complaint  Patient presents with  . Cough   UPPER RESPIRATORY TRACT INFECTION- has gotten significantly worse. Now with a fever. Not sleeping. Has been taking her azithromycin and her albuterol without benefit Duration: past day or 2 Worst symptom: wheezing, coughing Fever: yes Cough: yes Shortness of breath: yes Wheezing: yes Chest pain: yes, with cough Chest tightness: yes Chest congestion: yes Nasal congestion: yes Runny nose: yes Post nasal drip: yes Sneezing: yes Sore throat: no Swollen glands: no Sinus pressure: no Headache: no Face pain: no Toothache: no Ear pain: no  Ear pressure: no  Eyes red/itching:no Eye drainage/crusting: no  Vomiting: no Rash: no Fatigue: yes Sick contacts: no Strep contacts: no  Context: worse Recurrent sinusitis: no Relief with OTC cold/cough medications: no  Treatments attempted: albuterol and antibiotics   Relevant past medical, surgical, family and social history reviewed and updated as indicated. Interim medical history since our last visit reviewed. Allergies and medications reviewed and updated.  Review of Systems  Constitutional: Positive for chills, diaphoresis, fatigue and fever. Negative for activity change, appetite change and unexpected weight change.  HENT: Positive for congestion, postnasal drip, rhinorrhea, sinus pain, sinus pressure, sneezing and sore throat. Negative for dental problem, drooling, ear discharge, ear pain, facial swelling, hearing loss, mouth sores, nosebleeds, tinnitus, trouble swallowing and voice change.   Respiratory: Positive for cough,  chest tightness, shortness of breath and wheezing. Negative for apnea, choking and stridor.   Cardiovascular: Negative.   Psychiatric/Behavioral: Negative.     Per HPI unless specifically indicated above     Objective:    BP 115/81 (BP Location: Left Arm, Patient Position: Sitting, Cuff Size: Large)   Pulse (!) 122   Temp (!) 100.8 F (38.2 C)   Wt 203 lb 12.8 oz (92.4 kg)   LMP 01/23/2016 (Approximate)   SpO2 90%   BMI 34.34 kg/m   Wt Readings from Last 3 Encounters:  02/11/16 203 lb 12.8 oz (92.4 kg)  02/06/16 201 lb 14.4 oz (91.6 kg)  10/04/15 191 lb (86.6 kg)    Physical Exam  Constitutional: She is oriented to person, place, and time. She appears well-developed and well-nourished. She appears distressed.  HENT:  Head: Normocephalic and atraumatic.  Right Ear: Hearing and external ear normal.  Left Ear: Hearing and external ear normal.  Nose: Nose normal.  Mouth/Throat: Oropharynx is clear and moist. No oropharyngeal exudate.  Eyes: Conjunctivae and lids are normal. Right eye exhibits no discharge. Left eye exhibits no discharge. No scleral icterus.  Cardiovascular: Regular rhythm, normal heart sounds and intact distal pulses.  Tachycardia present.  Exam reveals no gallop and no friction rub.   No murmur heard. Pulmonary/Chest: Effort normal. No respiratory distress. She has wheezes in the right upper field, the right middle field, the right lower field, the left upper field, the left middle field and the left lower field. She has rhonchi in the right upper field, the right middle field, the right lower field, the left upper field, the left  middle field and the left lower field. She has no rales. She exhibits no tenderness.  Musculoskeletal: Normal range of motion.  Neurological: She is alert and oriented to person, place, and time.  Skin: Skin is warm and intact. No rash noted. She is diaphoretic. No erythema. There is pallor.  Psychiatric: She has a normal mood and  affect. Her speech is normal and behavior is normal. Judgment and thought content normal. Cognition and memory are normal.  Nursing note and vitals reviewed.   Results for orders placed or performed in visit on 08/05/15  IGP, Aptima HPV, rfx 16/18,45  Result Value Ref Range   DIAGNOSIS: Comment    Specimen adequacy: Comment    CLINICIAN PROVIDED ICD10: Comment    Performed by: Comment    PAP SMEAR COMMENT .    Note: Comment    Test Methodology Comment    HPV Aptima Negative Negative      Assessment & Plan:   Problem List Items Addressed This Visit    None    Visit Diagnoses    Wheezing    -  Primary   Has failed azithromycin and albuterol at home. Significant wheezing. Will send her to the ER for further evalation on a more urgent basis.        Follow up plan: Return After ER visit.

## 2016-02-12 LAB — BLOOD CULTURE ID PANEL (REFLEXED)

## 2016-02-12 LAB — URINALYSIS, COMPLETE (UACMP) WITH MICROSCOPIC
Bacteria, UA: NONE SEEN
Bilirubin Urine: NEGATIVE
Glucose, UA: 50 mg/dL — AB
Ketones, ur: NEGATIVE mg/dL
Leukocytes, UA: NEGATIVE
Nitrite: NEGATIVE
Protein, ur: NEGATIVE mg/dL
RBC / HPF: NONE SEEN RBC/hpf (ref 0–5)
Specific Gravity, Urine: 1.008 (ref 1.005–1.030)
Squamous Epithelial / LPF: NONE SEEN
pH: 6 (ref 5.0–8.0)

## 2016-02-12 LAB — BASIC METABOLIC PANEL
Anion gap: 5 (ref 5–15)
BUN: 7 mg/dL (ref 6–20)
CO2: 29 mmol/L (ref 22–32)
Calcium: 7.7 mg/dL — ABNORMAL LOW (ref 8.9–10.3)
Chloride: 106 mmol/L (ref 101–111)
Creatinine, Ser: 0.57 mg/dL (ref 0.44–1.00)
GFR calc Af Amer: >60 mL/min (ref >60)
GFR calc non Af Amer: >60 mL/min (ref >60)
Glucose, Bld: 161 mg/dL — ABNORMAL HIGH (ref 65–99)
Potassium: 3.9 mmol/L (ref 3.5–5.1)
Sodium: 140 mmol/L (ref 135–145)

## 2016-02-12 LAB — CBC
HCT: 35.9 % (ref 35.0–47.0)
Hemoglobin: 12.2 g/dL (ref 12.0–16.0)
MCH: 30.6 pg (ref 26.0–34.0)
MCHC: 34.1 g/dL (ref 32.0–36.0)
MCV: 89.7 fL (ref 80.0–100.0)
Platelets: 177 10*3/uL (ref 150–440)
RBC: 4 MIL/uL (ref 3.80–5.20)
RDW: 14.1 % (ref 11.5–14.5)
WBC: 4.6 10*3/uL (ref 3.6–11.0)

## 2016-02-12 LAB — EXPECTORATED SPUTUM ASSESSMENT W GRAM STAIN, RFLX TO RESP C: Special Requests: NORMAL

## 2016-02-12 LAB — EXPECTORATED SPUTUM ASSESSMENT W REFEX TO RESP CULTURE

## 2016-02-12 MED ORDER — NICOTINE 21 MG/24HR TD PT24: 21.0000 mg | MEDICATED_PATCH | Freq: Every day | TRANSDERMAL | Status: DC

## 2016-02-12 MED ORDER — CLONAZEPAM 1 MG PO TABS
0.5000 mg | ORAL_TABLET | Freq: Three times a day (TID) | ORAL | Status: DC | PRN
  Administered 2016-02-13 – 2016-02-14 (×4): 1 mg via ORAL
  Filled 2016-02-12 (×4): qty 1

## 2016-02-12 MED ORDER — GUAIFENESIN ER 600 MG PO TB12
600.0000 mg | ORAL_TABLET | Freq: Two times a day (BID) | ORAL | Status: DC
  Administered 2016-02-12 – 2016-02-14 (×5): 600 mg via ORAL
  Filled 2016-02-12 (×6): qty 1

## 2016-02-12 MED ORDER — LEVOFLOXACIN 750 MG PO TABS
750.0000 mg | ORAL_TABLET | ORAL | Status: DC
  Administered 2016-02-12 – 2016-02-13 (×2): 750 mg via ORAL
  Filled 2016-02-12 (×2): qty 1

## 2016-02-12 MED ORDER — IPRATROPIUM-ALBUTEROL 0.5-2.5 (3) MG/3ML IN SOLN
3.0000 mL | RESPIRATORY_TRACT | Status: DC
  Administered 2016-02-12 – 2016-02-14 (×8): 3 mL via RESPIRATORY_TRACT
  Filled 2016-02-12 (×10): qty 3

## 2016-02-12 MED ORDER — HYDROCOD POLST-CPM POLST ER 10-8 MG/5ML PO SUER
5.0000 mL | Freq: Two times a day (BID) | ORAL | Status: DC
  Administered 2016-02-12 – 2016-02-14 (×4): 5 mL via ORAL
  Filled 2016-02-12 (×4): qty 5

## 2016-02-12 NOTE — Progress Notes (Signed)
CONCERNING: Antibiotic IV to Oral Route Change Policy  RECOMMENDATION: This patient is receiving levofloxacin by the intravenous route.  Based on criteria approved by the Pharmacy and Therapeutics Committee, the antibiotic(s) is/are being converted to the equivalent oral dose form(s).   DESCRIPTION: These criteria include:  Patient being treated for a respiratory tract infection, urinary tract infection, cellulitis or clostridium difficile associated diarrhea if on metronidazole  The patient is not neutropenic and does not exhibit a GI malabsorption state  The patient is eating (either orally or via tube) and/or has been taking other orally administered medications for a least 24 hours  The patient is improving clinically and has a Tmax < 100.5  If you have questions about this conversion, please contact the Pharmacy Department  []  ( [phone] )  [place] [x]  ( [phone] )  [facility] []  ( [phone]  [place] []  ( [phone] )  [facility] []  ( [phone] )  [facility]   

## 2016-02-12 NOTE — Progress Notes (Signed)
PHARMACY - PHYSICIAN COMMUNICATION CRITICAL VALUE ALERT - BLOOD CULTURE IDENTIFICATION (BCID)  No results found for this or any previous visit.  Name of physician (or Provider) Contacted: Vachanni   Changes to prescribed antibiotics required: No , pt already on levaquin.   Aalaya Yadao D 02/12/2016  9:20 PM

## 2016-02-12 NOTE — Progress Notes (Signed)
[facility] [place] NAME: Becky Hubbard    MR#:  [id number]  DATE OF BIRTH:  August 07, 1970  SUBJECTIVE:  CHIEF COMPLAINT:   Chief Complaint  Patient presents with  . Fever  . Shortness of Breath  . Cough  Patient is 46 year old Caucasian female with past nuchal history significant for history of ongoing tobacco abuse, ADHD, PTSD, fibromyalgia, gastroparesis, gastroesophageal reflux disease, depression, anxiety, COPD, who presents to the hospital with complaints of shortness of breath, wheezing, cough, progressively getting worse over the past few days. Currently she was treated with Z-Pak but did not improve and was referred to emergency room for admission. In emergency room, she was noted to be febrile, T of  101.3. She was admitted. Her chest x-ray revealed a left mid lung zone consolidation, the patient she was initiated on Levaquin. She feels somewhat better today.  Review of Systems  Constitutional: Negative for chills, fever and weight loss.  HENT: Negative for congestion.   Eyes: Negative for blurred vision and double vision.  Respiratory: Positive for cough, sputum production, shortness of breath and wheezing.   Cardiovascular: Negative for chest pain, palpitations, orthopnea, leg swelling and PND.  Gastrointestinal: Negative for abdominal pain, blood in stool, constipation, diarrhea, nausea and vomiting.  Genitourinary: Negative for dysuria, frequency, hematuria and urgency.  Musculoskeletal: Negative for falls.  Neurological: Negative for dizziness, tremors, focal weakness and headaches.  Endo/Heme/Allergies: Does not bruise/bleed easily.  Psychiatric/Behavioral: Negative for depression. The patient does not have insomnia.     VITAL SIGNS: Blood pressure 128/74, pulse 95, temperature 97.7 F (36.5 C), temperature source Oral, resp. rate 17, height 5\' 5"  (1.651 m), weight 92.1 kg (203 lb), last menstrual period 01/23/2016, SpO2 97  %.  PHYSICAL EXAMINATION:   GENERAL:  46 y.o.-year-old patient lying in the bed with no acute distress.  EYES: Pupils equal, round, reactive to light and accommodation. No scleral icterus. Extraocular muscles intact.  HEENT: Head atraumatic, normocephalic. Oropharynx and nasopharynx clear.  NECK:  Supple, no jugular venous distention. No thyroid enlargement, no tenderness.  LUNGS: diminished breath sounds bilaterally, scattered wheezing, more in the right lung zone, few scattered rhonchi , but no crepitations . Intermittent use of accessory muscles of respiration.  CARDIOVASCULAR: S1, S2 normal. No murmurs, rubs, or gallops.  ABDOMEN: Soft, nontender, nondistended. Bowel sounds present. No organomegaly or mass.  EXTREMITIES: No pedal edema, cyanosis, or clubbing.  NEUROLOGIC: Cranial nerves II through XII are intact. Muscle strength 5/5 in all extremities. Sensation intact. Gait not checked.  PSYCHIATRIC: The patient is alert and oriented x 3.  SKIN: No obvious rash, lesion, or ulcer.   ORDERS/RESULTS REVIEWED:   CBC  Recent Labs Lab 02/11/16 1242 02/12/16 0500  WBC 5.6 4.6  HGB 12.5 12.2  HCT 37.1 35.9  PLT 208 177  MCV 88.7 89.7  MCH 30.0 30.6  MCHC 33.8 34.1  RDW 14.2 14.1  LYMPHSABS 0.5*  --   MONOABS 0.8  --   EOSABS 0.0  --   BASOSABS 0.0  --    ------------------------------------------------------------------------------------------------------------------  Chemistries   Recent Labs Lab 02/11/16 1242 02/12/16 0500  NA 134* 140  K 3.2* 3.9  CL 97* 106  CO2 30 29  GLUCOSE 120* 161*  BUN 7 7  CREATININE 0.76 0.57  CALCIUM 8.0* 7.7*  AST 26  --   ALT 15  --   ALKPHOS 37*  --   BILITOT 0.6  --    ------------------------------------------------------------------------------------------------------------------  estimated creatinine clearance is 98.5 mL/min (by C-G formula based on SCr of 0.57  mg/dL). ------------------------------------------------------------------------------------------------------------------ No results for input(s): TSH, T4TOTAL, T3FREE, THYROIDAB in the last 72 hours.  Invalid input(s): FREET3  Cardiac Enzymes No results for input(s): CKMB, TROPONINI, MYOGLOBIN in the last 168 hours.  Invalid input(s): CK ------------------------------------------------------------------------------------------------------------------ Invalid input(s): POCBNP ---------------------------------------------------------------------------------------------------------------  RADIOLOGY: Dg Chest Portable 1 View  Result Date: 02/11/2016 CLINICAL DATA:  Shortness of breath with cough and wheezing EXAM: PORTABLE CHEST 1 VIEW COMPARISON:  February 06, 2016 FINDINGS: There is airspace consolidation throughout much of the left mid lower lung zones. Right lung is clear. Heart size and pulmonary vascularity are normal. No adenopathy. No bone lesions. IMPRESSION: Airspace consolidation most consistent with pneumonia throughout portions of the left mid lower lung zones. Lungs elsewhere clear. Cardiac silhouette within normal limits. Followup PA and lateral chest radiographs recommended in 3-4 weeks following trial of antibiotic therapy to ensure resolution and exclude underlying malignancy. Electronically Signed   By: Lowella Grip III M.D.   On: 02/11/2016 13:30    EKG:  Orders placed or performed during the hospital encounter of 02/11/16  . EKG 12-Lead  . EKG 12-Lead  . EKG 12-Lead  . EKG 12-Lead  . EKG 12-Lead  . EKG 12-Lead    ASSESSMENT AND PLAN:  Active Problems:   COPD exacerbation (HCC) #1acute respiratory failure with hypoxia, now on 4 L of oxygen through nasal cannula,continue current therapy with antibiotics, steroids, nebulizing therapy, wean off oxygen as tolerated #2. COPD exacerbation, continue levofloxacin,Pulmicort, Solu Medrol,duo nebs, follow clinically #3.  Community acquired pneumonia, continue levofloxacin, get sputum cultures if possible #4. Tobacco abuse. Counseling, discussed this patient for 4 minutes, nicotine replacement therapy is initiated #5. Hyponatremia, resolved #6. Hyperglycemia, get hemoglobin A1c #7. Fever,influenza test is negative, likely pneumonia related   Management plans discussed with the patient, family and they are in agreement.   DRUG ALLERGIES:  Allergies  Allergen Reactions  . Doxycycline   . Lyrica [Pregabalin] Swelling    Ankles swell and turn red  . Neurontin [Gabapentin]   . Chantix [Varenicline] Rash  . Other Other (See Comments)    Cats - cough/sneezing  . Penicillin G Hives  . Penicillins Hives    Has patient had a PCN reaction causing immediate rash, facial/tongue/throat swelling, SOB or lightheadedness with hypotension: No Has patient had a PCN reaction causing severe rash involving mucus membranes or skin necrosis: No Has patient had a PCN reaction that required hospitalization No Has patient had a PCN reaction occurring within the last 10 years: No If all of the above answers are "NO", then may proceed with Cephalosporin use.   . Sulfa Antibiotics Hives    CODE STATUS:     Code Status Orders        Start     Ordered   02/11/16 1724  Full code  Continuous     02/11/16 1724    Code Status History    Date Active Date Inactive Code Status Order ID Comments User Context   This patient has a current code status but no historical code status.      TOTAL TIME TAKING CARE OF THIS PATIENT: 40 minutes.  Discussed this patient as well as patient's mother, all questions were . Answered, they voiced understanding  Baine Decesare M.D on 02/12/2016 at 4:37 PM  Between 7am to 6pm - Pager - [phone]  After 6pm go to [email or web address] - password [facility]  Office  [phone]  CC: Primary care physician; Park Liter, DO

## 2016-02-13 LAB — URINE CULTURE

## 2016-02-13 LAB — HEMOGLOBIN A1C
Hgb A1c MFr Bld: 5.7 % — ABNORMAL HIGH (ref 4.8–5.6)
Mean Plasma Glucose: 117 mg/dL

## 2016-02-13 MED ORDER — METHYLPREDNISOLONE SODIUM SUCC 40 MG IJ SOLR
40.0000 mg | Freq: Two times a day (BID) | INTRAMUSCULAR | Status: DC
  Administered 2016-02-13 – 2016-02-14 (×2): 40 mg via INTRAVENOUS
  Filled 2016-02-13 (×2): qty 1

## 2016-02-13 NOTE — Plan of Care (Signed)
Problem: Respiratory: Goal: Respiratory status will improve Outcome: Progressing Pt is no longer on O2.

## 2016-02-13 NOTE — Progress Notes (Signed)
[facility] [place] NAME: Becky Hubbard    MR#:  [id number]  DATE OF BIRTH:  Jan 18, 1971  SUBJECTIVE:   Patient is here due to shortness of breath and wheezing secondary to COPD exacerbation from pneumonia. Shortness of breath and wheezing significantly improved since admission. Blood cultures are positive for staph but not identified yet. Afebrile.  REVIEW OF SYSTEMS:    Review of Systems  Constitutional: Negative for chills and fever.  HENT: Negative for congestion and tinnitus.   Eyes: Negative for blurred vision and double vision.  Respiratory: Positive for shortness of breath and wheezing. Negative for cough.   Cardiovascular: Negative for chest pain, orthopnea and PND.  Gastrointestinal: Negative for abdominal pain, diarrhea, nausea and vomiting.  Genitourinary: Negative for dysuria and hematuria.  Neurological: Negative for dizziness, sensory change and focal weakness.  All other systems reviewed and are negative.   Nutrition: Regular Tolerating Diet: yes  Tolerating PT: Ambulatory   DRUG ALLERGIES:   Allergies  Allergen Reactions  . Doxycycline   . Lyrica [Pregabalin] Swelling    Ankles swell and turn red  . Neurontin [Gabapentin]   . Chantix [Varenicline] Rash  . Other Other (See Comments)    Cats - cough/sneezing  . Penicillin G Hives  . Penicillins Hives    Has patient had a PCN reaction causing immediate rash, facial/tongue/throat swelling, SOB or lightheadedness with hypotension: No Has patient had a PCN reaction causing severe rash involving mucus membranes or skin necrosis: No Has patient had a PCN reaction that required hospitalization No Has patient had a PCN reaction occurring within the last 10 years: No If all of the above answers are "NO", then may proceed with Cephalosporin use.   . Sulfa Antibiotics Hives    VITALS:  Blood pressure 110/73, pulse (!) 114, temperature 97.6 F (36.4 C), temperature source  Oral, resp. rate 19, height 5\' 5"  (1.651 m), weight 92.1 kg (203 lb), last menstrual period 01/23/2016, SpO2 90 %.  PHYSICAL EXAMINATION:   Physical Exam  GENERAL:  46 y.o.-year-old patient lying in the bed in no acute distress.  EYES: Pupils equal, round, reactive to light and accommodation. No scleral icterus. Extraocular muscles intact.  HEENT: Head atraumatic, normocephalic. Oropharynx and nasopharynx clear.  NECK:  Supple, no jugular venous distention. No thyroid enlargement, no tenderness.  LUNGS: Good a/e b/l, no wheezing, rales, rhonchi. No use of accessory muscles of respiration.  CARDIOVASCULAR: S1, S2 normal. No murmurs, rubs, or gallops.  ABDOMEN: Soft, nontender, nondistended. Bowel sounds present. No organomegaly or mass.  EXTREMITIES: No cyanosis, clubbing or edema b/l.    NEUROLOGIC: Cranial nerves II through XII are intact. No focal Motor or sensory deficits b/l.   PSYCHIATRIC: The patient is alert and oriented x 3.  SKIN: No obvious rash, lesion, or ulcer.    LABORATORY PANEL:   CBC  Recent Labs Lab 02/12/16 0500  WBC 4.6  HGB 12.2  HCT 35.9  PLT 177   ------------------------------------------------------------------------------------------------------------------  Chemistries   Recent Labs Lab 02/11/16 1242 02/12/16 0500  NA 134* 140  K 3.2* 3.9  CL 97* 106  CO2 30 29  GLUCOSE 120* 161*  BUN 7 7  CREATININE 0.76 0.57  CALCIUM 8.0* 7.7*  AST 26  --   ALT 15  --   ALKPHOS 37*  --   BILITOT 0.6  --    ------------------------------------------------------------------------------------------------------------------  Cardiac Enzymes No results for input(s): TROPONINI in the last 168 hours. ------------------------------------------------------------------------------------------------------------------  RADIOLOGY:  No results found.   ASSESSMENT AND PLAN:   46 year old female with past medical history of fibromyalgia, PTSD, anxiety,  depression, COPD with ongoing tobacco abuse, gastroparesis, psoriasis, ADHD who presented to the hospital due to shortness of breath, cough, wheezing.  1. Acute respiratory failure with hypoxia-secondary to COPD exacerbation with pneumonia. - much improved and being weaned off O2.  -cont. IV steroids but will taper, cont. scheduled DuoNeb's, empiric antibiotics  2. COPD exacerbation-secondary to pneumonia seen on chest x-ray. Patient noted to have left mid and lower lobe pneumonia. - cont. IV steroids but will taper as she is improving, cont. Duonebs, pulmicort nebs.  - much improved since past 2 days.   3. Pneumonia-community acquired. - place on IV Levaquin and sputum Cx are (-) so far.   4. Bacteremia - pt. Noted to have Staph in one of the blood cultures.  ?? Contaminant.  - not identified yet.  Cont. Empiric Levaquin for now.    5. Depression - cont. Cymbalta.   6. GERD - cont. Protonix, Sucralfate  7. Anxiety - cont. Klonopin.    All the records are reviewed and case discussed with Care Management/Social Worker. Management plans discussed with the patient, family and they are in agreement.  CODE STATUS: Full   DVT Prophylaxis: Lovenox  TOTAL TIME TAKING CARE OF THIS PATIENT: 30 minutes.   POSSIBLE D/C IN 1-2 DAYS, DEPENDING ON CLINICAL CONDITION.   Henreitta Leber M.D on 02/13/2016 at 4:07 PM  Between 7am to 6pm - Pager - [phone]  After 6pm go to [email or web address] - [employer] [facility]  Office  [phone]  CC: Primary care physician; Park Liter, DO

## 2016-02-14 ENCOUNTER — Telehealth: Payer: Self-pay | Admitting: Family Medicine

## 2016-02-14 ENCOUNTER — Other Ambulatory Visit: Payer: Self-pay | Admitting: Family Medicine

## 2016-02-14 LAB — CULTURE, BLOOD (ROUTINE X 2)

## 2016-02-14 MED ORDER — LEVOFLOXACIN 750 MG PO TABS: 750.0000 mg | ORAL_TABLET | ORAL | 0 refills | Status: AC

## 2016-02-14 MED ORDER — PREDNISONE 10 MG PO TABS: ORAL_TABLET | ORAL | 0 refills | Status: DC

## 2016-02-14 MED ORDER — BUDESONIDE-FORMOTEROL FUMARATE 80-4.5 MCG/ACT IN AERO: 2.0000 | INHALATION_SPRAY | Freq: Two times a day (BID) | RESPIRATORY_TRACT | 12 refills | Status: DC

## 2016-02-14 MED ORDER — IPRATROPIUM-ALBUTEROL 0.5-2.5 (3) MG/3ML IN SOLN: 3.0000 mL | Freq: Four times a day (QID) | RESPIRATORY_TRACT | 0 refills | Status: DC | PRN

## 2016-02-14 MED ORDER — GUAIFENESIN ER 600 MG PO TB12: 600.0000 mg | ORAL_TABLET | Freq: Two times a day (BID) | ORAL | 0 refills | Status: DC

## 2016-02-14 NOTE — Telephone Encounter (Signed)
Received a call from [place] regarding fax on this patient  All request to [facility] should go to  Fax # [phone]  She also asked that any patients with Medicaid [place] Access we include the NPI #.  Thanks  [name]

## 2016-02-14 NOTE — Telephone Encounter (Signed)
Routing to provider  

## 2016-02-14 NOTE — Telephone Encounter (Signed)
Faxed to new number and NPI added.

## 2016-02-14 NOTE — Discharge Summary (Signed)
[facility] [place] NAME: Becky Hubbard    MR#:  [id number]  DATE OF BIRTH:  1970-11-05  DATE OF ADMISSION:  02/11/2016 ADMITTING PHYSICIAN: Henreitta Leber, MD  DATE OF DISCHARGE: 02/14/2016 12:53 PM  PRIMARY CARE PHYSICIAN: Park Liter, DO    ADMISSION DIAGNOSIS:  Sepsis, due to unspecified organism ([place]) [A41.9] Community acquired pneumonia, unspecified laterality [J18.9]  DISCHARGE DIAGNOSIS:  Active Problems:   COPD exacerbation ([place])   SECONDARY DIAGNOSIS:   Past Medical History:  Diagnosis Date  . ADD (attention deficit disorder)   . Anxiety   . Arthritis   . Auditory hallucinations   . Carpal tunnel syndrome on both sides   . Cervical spine fracture ([place]) 2008   Closed with screw C2-3 [facility] Dr. Delilah Shan  . Degenerative joint disease of spine   . Depression   . Family history of adverse reaction to anesthesia    mom- slow to awaken  . Fibromyalgia   . Foot fracture, left   . Gastroparesis   . Gastroparesis   . GERD (gastroesophageal reflux disease)   . Hearing loss   . Herniated cervical disc   . History of prescription drug abuse ([place])   . Neuromuscular disorder (HCC)    fibromyalgia/chronic fatique syndrome  . Neuropathy of foot   . OCD (obsessive compulsive disorder)   . 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 Plaque psoriasis   . PTSD (post-traumatic stress disorder)   . Ulcer [facility])     HOSPITAL COURSE:   46 year old female with past medical history of fibromyalgia, PTSD, anxiety, depression, COPD with ongoing tobacco abuse, gastroparesis, psoriasis, ADHD who presented to the hospital due to shortness of breath, cough, wheezing.  1. Acute respiratory failure with hypoxia-secondary to COPD exacerbation with pneumonia. - pt. Was treated w/ IV steroids, duonebs, and empiric abx and has improved and now  being discharged home.  - she did not quality for Home O2.  She was discharged on Oral Pred. Taper, empiric Levaquin for a few days and nebulizer machine with PRN nebs and oral inhaler.   2. COPD exacerbation-secondary to pneumonia seen on chest x-ray. Patient noted to have left mid and lower lobe pneumonia. - she was treated w/ IV steroids, duonebs, pulmicort nebs and much improved. Now being discharged on Oral prednisone taper, albuterol inhaler, symbicort, duonebs PRN.    3. Pneumonia-community acquired. - she was treated w/ IV Levaquin and now being discharged on Oral Levaquin.   4. Bacteremia - pt. Noted to have Staph in one of the blood cultures.  This was coagulase negative Staph and contaminant and likely does not need to be treated.   5. Depression - pt. Will cont. Cymbalta.   6. GERD - pt. Will cont. Protonix, Sucralfate  7. Anxiety - pt. Will cont. Klonopin.   DISCHARGE CONDITIONS:   Stable.   CONSULTS OBTAINED:    DRUG ALLERGIES:   Allergies  Allergen Reactions  . Doxycycline   . Lyrica [Pregabalin] Swelling    Ankles swell and turn red  . Neurontin [Gabapentin]   . Chantix [Varenicline] Rash  . Other Other (See Comments)    Cats - cough/sneezing  . Penicillin G Hives  . Penicillins Hives    Has patient had a PCN reaction causing immediate rash, facial/tongue/throat swelling, SOB or lightheadedness with hypotension: No  Has patient had a PCN reaction causing severe rash involving mucus membranes or skin necrosis: No Has patient had a PCN reaction that required hospitalization No Has patient had a PCN reaction occurring within the last 10 years: No If all of the above answers are "NO", then may proceed with Cephalosporin use.   . Sulfa Antibiotics Hives    DISCHARGE MEDICATIONS:   Allergies as of 02/14/2016      Reactions   Doxycycline    Lyrica [pregabalin] Swelling   Ankles swell and turn red   Neurontin [gabapentin]    Chantix [varenicline] Rash    Other Other (See Comments)   Cats - cough/sneezing   Penicillin G Hives   Penicillins Hives   Has patient had a PCN reaction causing immediate rash, facial/tongue/throat swelling, SOB or lightheadedness with hypotension: No Has patient had a PCN reaction causing severe rash involving mucus membranes or skin necrosis: No Has patient had a PCN reaction that required hospitalization No Has patient had a PCN reaction occurring within the last 10 years: No If all of the above answers are "NO", then may proceed with Cephalosporin use.   Sulfa Antibiotics Hives      Medication List    STOP taking these medications   mupirocin ointment 2 % Commonly known as:  BACTROBAN     TAKE these medications   albuterol 108 (90 Base) MCG/ACT inhaler Commonly known as:  PROVENTIL HFA;VENTOLIN HFA Inhale 2 puffs into the lungs every 6 (six) hours as needed for wheezing or shortness of breath.   aspirin 81 MG tablet Take 81 mg by mouth daily.   budesonide-formoterol 80-4.5 MCG/ACT inhaler Commonly known as:  SYMBICORT Inhale 2 puffs into the lungs 2 (two) times daily.   clonazePAM 1 MG tablet Commonly known as:  KLONOPIN TAKE ONE TABLET BY MOUTH TWICE DAILY AS NEEDED FOR ANXIETY AND A HALFTABLET AT BEDTIME AS NEEDED FOR ANXIETY   DULoxetine 60 MG capsule Commonly known as:  CYMBALTA Take 1 capsule (60 mg total) by mouth daily.   fluticasone 50 MCG/ACT nasal spray Commonly known as:  FLONASE Place 2 sprays into both nostrils daily.   guaiFENesin 600 MG 12 hr tablet Commonly known as:  MUCINEX Take 1 tablet (600 mg total) by mouth 2 (two) times daily.   ipratropium-albuterol 0.5-2.5 (3) MG/3ML Soln Commonly known as:  DUONEB Take 3 mLs by nebulization every 6 (six) hours as needed.   levofloxacin 750 MG tablet Commonly known as:  LEVAQUIN Take 1 tablet (750 mg total) by mouth daily.   loratadine 10 MG tablet Commonly known as:  CLARITIN Take 1 tablet (10 mg total) by mouth daily.    nicotine 14 mg/24hr patch Commonly known as:  NICODERM CQ - dosed in mg/24 hours Place 1 patch (14 mg total) onto the skin daily.   nicotine polacrilex 4 MG lozenge Commonly known as:  EQL NICOTINE POLACRILEX Take 1 lozenge (4 mg total) by mouth as needed for smoking cessation.   omega-3 acid ethyl esters 1 g capsule Commonly known as:  LOVAZA Take by mouth.   pantoprazole 40 MG tablet Commonly known as:  PROTONIX Take 1 tablet (40 mg total) by mouth daily.   predniSONE 10 MG tablet Commonly known as:  DELTASONE Label  & dispense according to the schedule below. 5 Pills PO for 1 day then, 4 Pills PO for 1 day, 3 Pills PO for 1 day, 2 Pills PO for 1 day, 1 Pill PO for 1 days then  STOP.   RA VITAMIN B-12 TR 1000 MCG Tbcr Generic drug:  Cyanocobalamin Take by mouth.   risperiDONE 1 MG tablet Commonly known as:  RISPERDAL Take 1 mg by mouth at bedtime.   SM MULTIPLE VITAMINS/IRON Tabs Take by mouth.   sucralfate 1 g tablet Commonly known as:  CARAFATE Take 1 g by mouth 4 (four) times daily.   traMADol 50 MG tablet Commonly known as:  ULTRAM TAKE ONE TABLET BY MOUTH EVERY 12 HOURS AS NEEDED What changed:  See the new instructions.   Vitamin D3 1000 units Caps Take by mouth.         DISCHARGE INSTRUCTIONS:   DIET:  Regular diet  DISCHARGE CONDITION:  Stable  ACTIVITY:  Activity as tolerated  OXYGEN:  Home Oxygen: No.   Oxygen Delivery: room air  DISCHARGE LOCATION:  home   If you experience worsening of your admission symptoms, develop shortness of breath, life threatening emergency, suicidal or homicidal thoughts you must seek medical attention immediately by calling 911 or calling your MD immediately  if symptoms less severe.  You Must read complete instructions/literature along with all the possible adverse reactions/side effects for all the Medicines you take and that have been prescribed to you. Take any new Medicines after you have completely  understood and accpet all the possible adverse reactions/side effects.   Please note  You were cared for by a hospitalist during your hospital stay. If you have any questions about your discharge medications or the care you received while you were in the hospital after you are discharged, you can call the unit and asked to speak with the hospitalist on call if the hospitalist that took care of you is not available. Once you are discharged, your primary care physician will handle any further medical issues. Please note that NO REFILLS for any discharge medications will be authorized once you are discharged, as it is imperative that you return to your primary care physician (or establish a relationship with a primary care physician if you do not have one) for your aftercare needs so that they can reassess your need for medications and monitor your lab values.     Today   Shortness of breath improved.  No wheezing and feels better. BC consistent with contamination.   VITAL SIGNS:  Blood pressure (!) 141/75, pulse 99, temperature 97.8 F (36.6 C), temperature source Oral, resp. rate 18, height 5\' 5"  (1.651 m), weight 92.1 kg (203 lb), last menstrual period 01/23/2016, SpO2 93 %.  I/O:   Intake/Output Summary (Last 24 hours) at 02/14/16 1742 Last data filed at 02/14/16 0444  Gross per 24 hour  Intake              240 ml  Output              250 ml  Net              -10 ml    PHYSICAL EXAMINATION:  GENERAL:  46 y.o.-year-old patient lying in the bed with no acute distress.  EYES: Pupils equal, round, reactive to light and accommodation. No scleral icterus. Extraocular muscles intact.  HEENT: Head atraumatic, normocephalic. Oropharynx and nasopharynx clear.  NECK:  Supple, no jugular venous distention. No thyroid enlargement, no tenderness.  LUNGS: Normal breath sounds bilaterally, no wheezing, rales,rhonchi. No use of accessory muscles of respiration.  CARDIOVASCULAR: S1, S2 normal. No  murmurs, rubs, or gallops.  ABDOMEN: Soft, non-tender, non-distended. Bowel sounds present. No organomegaly or  mass.  EXTREMITIES: No pedal edema, cyanosis, or clubbing.  NEUROLOGIC: Cranial nerves II through XII are intact. No focal motor or sensory defecits b/l.  PSYCHIATRIC: The patient is alert and oriented x 3. Good affect.  SKIN: No obvious rash, lesion, or ulcer.   DATA REVIEW:   CBC  Recent Labs Lab 02/12/16 0500  WBC 4.6  HGB 12.2  HCT 35.9  PLT 177    Chemistries   Recent Labs Lab 02/11/16 1242 02/12/16 0500  NA 134* 140  K 3.2* 3.9  CL 97* 106  CO2 30 29  GLUCOSE 120* 161*  BUN 7 7  CREATININE 0.76 0.57  CALCIUM 8.0* 7.7*  AST 26  --   ALT 15  --   ALKPHOS 37*  --   BILITOT 0.6  --     Cardiac Enzymes No results for input(s): TROPONINI in the last 168 hours.  Microbiology Results  Results for orders placed or performed during the hospital encounter of 02/11/16  Urine culture     Status: Abnormal   Collection Time: 02/11/16 12:47 AM  Result Value Ref Range Status   Specimen Description URINE, RANDOM  Final   Special Requests NONE  Final   Culture MULTIPLE SPECIES PRESENT, SUGGEST RECOLLECTION (A)  Final   Report Status 02/13/2016 FINAL  Final  Blood Culture (routine x 2)     Status: None (Preliminary result)   Collection Time: 02/11/16 12:42 PM  Result Value Ref Range Status   Specimen Description BLOOD RIGHT AC  Final   Special Requests   Final    BOTTLES DRAWN AEROBIC AND ANAEROBIC AER 9ML ANA 8ML   Culture NO GROWTH 3 DAYS  Final   Report Status PENDING  Incomplete  Blood Culture (routine x 2)     Status: Abnormal   Collection Time: 02/11/16 12:42 PM  Result Value Ref Range Status   Specimen Description BLOOD RIGHT AC  Final   Special Requests   Final    BOTTLES DRAWN AEROBIC AND ANAEROBIC AER 8ML ANA 9ML   Culture  Setup Time   Final    Organism ID to follow GRAM POSITIVE COCCI AEROBIC BOTTLE ONLY CRITICAL RESULT CALLED TO, READ BACK  BY AND VERIFIED WITH: JASON ROBBINS AT 2116 02/12/2016 BY TFK.    Culture (A)  Final    STAPHYLOCOCCUS SPECIES (COAGULASE NEGATIVE) THE SIGNIFICANCE OF ISOLATING THIS ORGANISM FROM A SINGLE SET OF BLOOD CULTURES WHEN MULTIPLE SETS ARE DRAWN IS UNCERTAIN. PLEASE NOTIFY THE MICROBIOLOGY DEPARTMENT WITHIN ONE WEEK IF SPECIATION AND SENSITIVITIES ARE REQUIRED. Performed at [facility]    Report Status 02/14/2016 FINAL  Final  Blood Culture ID Panel (Reflexed)     Status: Abnormal   Collection Time: 02/11/16 12:42 PM  Result Value Ref Range Status   Enterococcus species NOT DETECTED NOT DETECTED Final   Listeria monocytogenes NOT DETECTED NOT DETECTED Final   Staphylococcus species DETECTED (A) NOT DETECTED Final    Comment: CRITICAL RESULT CALLED TO, READ BACK BY AND VERIFIED WITH: JASON ROBBINS AT 2116 02/12/2015 BY TFK.    Staphylococcus aureus NOT DETECTED NOT DETECTED Final   Methicillin resistance NOT DETECTED NOT DETECTED Final   Streptococcus species NOT DETECTED NOT DETECTED Final   Streptococcus agalactiae NOT DETECTED NOT DETECTED Final   Streptococcus pneumoniae NOT DETECTED NOT DETECTED Final   Streptococcus pyogenes NOT DETECTED NOT DETECTED Final   Acinetobacter baumannii NOT DETECTED NOT DETECTED Final   Enterobacteriaceae species NOT DETECTED NOT DETECTED Final  Enterobacter cloacae complex NOT DETECTED NOT DETECTED Final   Escherichia coli NOT DETECTED NOT DETECTED Final   Klebsiella oxytoca NOT DETECTED NOT DETECTED Final   Klebsiella pneumoniae NOT DETECTED NOT DETECTED Final   Proteus species NOT DETECTED NOT DETECTED Final   Serratia marcescens NOT DETECTED NOT DETECTED Final   Haemophilus influenzae NOT DETECTED NOT DETECTED Final   Neisseria meningitidis NOT DETECTED NOT DETECTED Final   Pseudomonas aeruginosa NOT DETECTED NOT DETECTED Final   Candida albicans NOT DETECTED NOT DETECTED Final   Candida glabrata NOT DETECTED NOT DETECTED Final   Candida  krusei NOT DETECTED NOT DETECTED Final   Candida parapsilosis NOT DETECTED NOT DETECTED Final   Candida tropicalis NOT DETECTED NOT DETECTED Final  Culture, sputum-assessment     Status: None   Collection Time: 02/12/16  2:53 PM  Result Value Ref Range Status   Specimen Description Expect. Sput  Final   Special Requests Normal  Final   Sputum evaluation   Final    Sputum specimen not acceptable for testing.  Please recollect.   CALLED TO EMILY HERRON TO RECOLLECT AT 1625 02/12/2016.  TFK    Report Status 02/12/2016 FINAL  Final    RADIOLOGY:  No results found.    Management plans discussed with the patient, family and they are in agreement.  CODE STATUS:  Code Status History    Date Active Date Inactive Code Status Order ID Comments User Context   02/11/2016  5:24 PM 02/14/2016  3:58 PM Full Code [id number]  Henreitta Leber, MD Inpatient      TOTAL TIME TAKING CARE OF THIS PATIENT: 40 minutes.    Henreitta Leber M.D on 02/14/2016 at 5:42 PM  Between 7am to 6pm - Pager - [phone]  After 6pm go to [email or web address] - [employer] [facility]  Office  [phone]  CC: Primary care physician; Park Liter, DO

## 2016-02-14 NOTE — Telephone Encounter (Signed)
Ok for sample per Dr. Wynetta Emery. Sample given.

## 2016-02-14 NOTE — Telephone Encounter (Signed)
Pt would like a sample of budesonide-formoterol (SYMBICORT) 80-4.5 MCG/ACT inhaler because her rx needs prior authorization.

## 2016-02-14 NOTE — Care Management (Signed)
MD ordered DME nebulizer and home health RN.  Patient to discharge home today.  At discharge patient will be staying with her mother.  PCP Park Liter.  Patient has declined home health RN.  Patient states that she already has a nebulizer in the home.  Confirms that nebulizer is in working order, states "it's pretty new".  Patient will take her nebulizer mask that she has used while inpatient home at discharge.  RNCM signing off.

## 2016-02-14 NOTE — Progress Notes (Signed)
Reviewed discharge instructions with patient and her mother including discharge medications and how to access my chart.  Removed Ivs too.

## 2016-02-16 LAB — CULTURE, BLOOD (ROUTINE X 2): Culture: NO GROWTH

## 2016-02-20 ENCOUNTER — Inpatient Hospital Stay: Payer: Self-pay | Admitting: Family Medicine

## 2016-02-20 ENCOUNTER — Ambulatory Visit: Payer: Medicaid Other | Admitting: Family Medicine

## 2016-02-27 ENCOUNTER — Inpatient Hospital Stay: Payer: Self-pay | Admitting: Family Medicine

## 2016-02-27 ENCOUNTER — Other Ambulatory Visit: Payer: Self-pay | Admitting: Family Medicine

## 2016-02-28 ENCOUNTER — Telehealth: Payer: Self-pay | Admitting: Family Medicine

## 2016-02-28 MED ORDER — TRAMADOL HCL 50 MG PO TABS: 50.0000 mg | ORAL_TABLET | Freq: Two times a day (BID) | ORAL | 0 refills | Status: DC | PRN

## 2016-02-28 NOTE — Telephone Encounter (Signed)
Verbal from Dr.Johnson,  Due to patient's insurance, she is not allowed to prescribe any controlled substances, patient has to be seen by the pain clinic.  Symbicort was sent in by a doctor in the hospital. Patient notified.

## 2016-02-28 NOTE — Telephone Encounter (Signed)
Patient had a hospital f/u scheduled with Dr Wynetta Emery yesterday and called today to try to get another appt but in the meantime she needs a refill on her Tramadol and her Symbacort sent to [place]

## 2016-02-28 NOTE — Telephone Encounter (Signed)
Pt would like a sample of Symbicort to last until PA is approved.

## 2016-02-28 NOTE — Telephone Encounter (Signed)
Up front for her.

## 2016-02-28 NOTE — Telephone Encounter (Signed)
Cannot give her more than 5 days of her pain medicine as she is medicaid and this is the office policy. 5 days given. Referral in place for the pain clinic. Only take medicine when she really needs it.

## 2016-03-02 ENCOUNTER — Inpatient Hospital Stay: Payer: Self-pay | Admitting: Family Medicine

## 2016-03-02 ENCOUNTER — Other Ambulatory Visit: Payer: Self-pay | Admitting: Family Medicine

## 2016-03-03 ENCOUNTER — Encounter: Payer: Self-pay | Admitting: Family Medicine

## 2016-03-03 ENCOUNTER — Ambulatory Visit (INDEPENDENT_AMBULATORY_CARE_PROVIDER_SITE_OTHER): Payer: Medicaid Other | Admitting: Family Medicine

## 2016-03-03 VITALS — BP 112/78 | HR 109 | Temp 98.3°F | Wt 203.4 lb

## 2016-03-03 DIAGNOSIS — R300 Dysuria: Secondary | ICD-10-CM

## 2016-03-03 DIAGNOSIS — Z720 Tobacco use: Secondary | ICD-10-CM | POA: Insufficient documentation

## 2016-03-03 DIAGNOSIS — J441 Chronic obstructive pulmonary disease with (acute) exacerbation: Secondary | ICD-10-CM

## 2016-03-03 DIAGNOSIS — N3000 Acute cystitis without hematuria: Secondary | ICD-10-CM

## 2016-03-03 MED ORDER — FLUTICASONE-SALMETEROL 250-50 MCG/DOSE IN AEPB: 1.0000 | INHALATION_SPRAY | Freq: Two times a day (BID) | RESPIRATORY_TRACT | 3 refills | Status: DC

## 2016-03-03 MED ORDER — BUPROPION HCL ER (SR) 150 MG PO TB12: ORAL_TABLET | ORAL | 2 refills | Status: DC

## 2016-03-03 MED ORDER — NICOTINE POLACRILEX 4 MG MT LOZG: 4.0000 mg | LOZENGE | OROMUCOSAL | 3 refills | Status: DC | PRN

## 2016-03-03 MED ORDER — CIPROFLOXACIN HCL 500 MG PO TABS: 500.0000 mg | ORAL_TABLET | Freq: Two times a day (BID) | ORAL | 0 refills | Status: DC

## 2016-03-03 MED ORDER — NICOTINE 14 MG/24HR TD PT24: 14.0000 mg | MEDICATED_PATCH | Freq: Every day | TRANSDERMAL | 0 refills | Status: DC

## 2016-03-03 NOTE — Progress Notes (Signed)
BP 112/78 (BP Location: Left Arm, Patient Position: Sitting, Cuff Size: Large)   Pulse (!) 109   Temp 98.3 F (36.8 C)   Wt 203 lb 6.4 oz (92.3 kg)   SpO2 98%   BMI 33.85 kg/m    Subjective:    Patient ID: Becky Hubbard, female    DOB: January 30, 1971, 46 y.o.   MRN: [id number]  HPI: Becky Hubbard is a 46 y.o. female  Chief Complaint  Patient presents with  . Hospitalization Follow-up   HOSPITAL FOLLOW UP Time since discharge: 18 days Hospital/facility: [facility] Diagnosis: COPD Exacerbation, Pneumonia  HOSPITAL COURSE:   46 year old female with past medical history of fibromyalgia, PTSD, anxiety, depression, COPD with ongoing tobacco abuse, gastroparesis, psoriasis, ADHD who presented to the hospital due to shortness of breath, cough, wheezing.  1. Acute respiratory failure with hypoxia-secondary to COPD exacerbation with pneumonia. - pt. Was treated w/ IV steroids, duonebs, and empiric abx and has improved and now being discharged home.  - she did not quality for Home O2.  She was discharged on Oral Pred. Taper, empiric Levaquin for a few days and nebulizer machine with PRN nebs and oral inhaler.   2. COPD exacerbation-secondary to pneumonia seen on chest x-ray. Patient noted to have left mid and lower lobe pneumonia. - she was treated w/ IV steroids, duonebs, pulmicort nebs and much improved. Now being discharged on Oral prednisone taper, albuterol inhaler, symbicort, duonebs PRN.    3. Pneumonia-community acquired. - she was treated w/ IV Levaquin and now being discharged on Oral Levaquin.   4. Bacteremia - pt. Noted to have Staph in one of the blood cultures. This was coagulase negative Staph and contaminant and likely does not need to be treated.   5. Depression - pt. Will cont. Cymbalta.   6. GERD - pt. Will cont. Protonix, Sucralfate  7. Anxiety - pt. Will cont. Klonopin.   Procedures/tests: labs, x-ray Consultants: None New medications: prednisone taper,  levaquin,  Discharge instructions:  Follow up here Status: better   SMOKING CESSATION Smoking Status: current every day smoker Smoking Amount: 1 ppd Smoking Onset: at 20 Smoking Quit Date: not set yet Smoking triggers: eating, driving Type of tobacco use: cigarettes Children in the house: no Other household members who smoke: no Treatments attempted:  Cold [place] Pneumovax: up to date  Going to have surgery on her nose, very happy about this. Currently has laryngitis. Following with ENT.   URINARY SYMPTOMS Duration: past few days Dysuria: yes Urinary frequency: yes Urgency: yes Small volume voids: no Symptom severity: moderate Urinary incontinence: yes Foul odor: yes Hematuria: no Abdominal pain: no Back pain: no Suprapubic pain/pressure: yes Flank pain: yes Fever:  no Vomiting: no Status: worse Previous urinary tract infection: yes Recurrent urinary tract infection: no Sexual activity: No sexually active/monogomous/practicing safe sex History of sexually transmitted disease: no Vaginal discharge: no Treatments attempted: increasing fluids    Relevant past medical, surgical, family and social history reviewed and updated as indicated. Interim medical history since our last visit reviewed. Allergies and medications reviewed and updated.  Review of Systems  Constitutional: Negative.   HENT: Positive for congestion, postnasal drip, rhinorrhea, sinus pain, sinus pressure and voice change. Negative for dental problem, drooling, ear discharge, ear pain, facial swelling, hearing loss, mouth sores, nosebleeds, sneezing, sore throat, tinnitus and trouble swallowing.   Respiratory: Negative.   Cardiovascular: Negative.   Genitourinary: Positive for dysuria, flank pain, frequency and urgency. Negative for decreased urine volume, difficulty  urinating, dyspareunia, enuresis, genital sores, hematuria, menstrual problem, pelvic pain, vaginal bleeding, vaginal discharge and vaginal  pain.  Psychiatric/Behavioral: Negative.     Per HPI unless specifically indicated above     Objective:    BP 112/78 (BP Location: Left Arm, Patient Position: Sitting, Cuff Size: Large)   Pulse (!) 109   Temp 98.3 F (36.8 C)   Wt 203 lb 6.4 oz (92.3 kg)   SpO2 98%   BMI 33.85 kg/m   Wt Readings from Last 3 Encounters:  03/03/16 203 lb 6.4 oz (92.3 kg)  02/11/16 203 lb (92.1 kg)  02/11/16 203 lb 12.8 oz (92.4 kg)    Physical Exam  Constitutional: She is oriented to person, place, and time. She appears well-developed and well-nourished. No distress.  HENT:  Head: Normocephalic and atraumatic.  Right Ear: Hearing normal.  Left Ear: Hearing normal.  Nose: Nose normal.  Eyes: Conjunctivae and lids are normal. Right eye exhibits no discharge. Left eye exhibits no discharge. No scleral icterus.  Cardiovascular: Normal rate, regular rhythm and intact distal pulses.  Exam reveals no gallop and no friction rub.   No murmur heard. Pulmonary/Chest: Effort normal and breath sounds normal. No respiratory distress. She has no wheezes. She has no rales. She exhibits no tenderness.  Musculoskeletal: Normal range of motion.  Neurological: She is alert and oriented to person, place, and time.  Skin: Skin is warm, dry and intact. No rash noted. No erythema. No pallor.  Psychiatric: She has a normal mood and affect. Her speech is normal and behavior is normal. Judgment and thought content normal. Cognition and memory are normal.  Nursing note and vitals reviewed.   Results for orders placed or performed during the hospital encounter of 02/11/16  Blood Culture (routine x 2)  Result Value Ref Range   Specimen Description BLOOD RIGHT AC    Special Requests      BOTTLES DRAWN AEROBIC AND ANAEROBIC AER 9ML ANA 8ML   Culture NO GROWTH 5 DAYS    Report Status 02/16/2016 FINAL   Blood Culture (routine x 2)  Result Value Ref Range   Specimen Description BLOOD RIGHT AC    Special Requests       BOTTLES DRAWN AEROBIC AND ANAEROBIC AER 8ML ANA 9ML   Culture  Setup Time      Organism ID to follow GRAM POSITIVE COCCI AEROBIC BOTTLE ONLY CRITICAL RESULT CALLED TO, READ BACK BY AND VERIFIED WITH: JASON ROBBINS AT 2116 02/12/2016 BY TFK.    Culture (A)     STAPHYLOCOCCUS SPECIES (COAGULASE NEGATIVE) THE SIGNIFICANCE OF ISOLATING THIS ORGANISM FROM A SINGLE SET OF BLOOD CULTURES WHEN MULTIPLE SETS ARE DRAWN IS UNCERTAIN. PLEASE NOTIFY THE MICROBIOLOGY DEPARTMENT WITHIN ONE WEEK IF SPECIATION AND SENSITIVITIES ARE REQUIRED. Performed at [facility]    Report Status 02/14/2016 FINAL   Urine culture  Result Value Ref Range   Specimen Description URINE, RANDOM    Special Requests NONE    Culture MULTIPLE SPECIES PRESENT, SUGGEST RECOLLECTION (A)    Report Status 02/13/2016 FINAL   Culture, sputum-assessment  Result Value Ref Range   Specimen Description Expect. Sput    Special Requests Normal    Sputum evaluation      Sputum specimen not acceptable for testing.  Please recollect.   CALLED TO EMILY HERRON TO RECOLLECT AT 1625 02/12/2016.  TFK    Report Status 02/12/2016 FINAL   Blood Culture ID Panel (Reflexed)  Result Value Ref Range   Enterococcus  species NOT DETECTED NOT DETECTED   Listeria monocytogenes NOT DETECTED NOT DETECTED   Staphylococcus species DETECTED (A) NOT DETECTED   Staphylococcus aureus NOT DETECTED NOT DETECTED   Methicillin resistance NOT DETECTED NOT DETECTED   Streptococcus species NOT DETECTED NOT DETECTED   Streptococcus agalactiae NOT DETECTED NOT DETECTED   Streptococcus pneumoniae NOT DETECTED NOT DETECTED   Streptococcus pyogenes NOT DETECTED NOT DETECTED   Acinetobacter baumannii NOT DETECTED NOT DETECTED   Enterobacteriaceae species NOT DETECTED NOT DETECTED   Enterobacter cloacae complex NOT DETECTED NOT DETECTED   Escherichia coli NOT DETECTED NOT DETECTED   Klebsiella oxytoca NOT DETECTED NOT DETECTED   Klebsiella pneumoniae NOT  DETECTED NOT DETECTED   Proteus species NOT DETECTED NOT DETECTED   Serratia marcescens NOT DETECTED NOT DETECTED   Haemophilus influenzae NOT DETECTED NOT DETECTED   Neisseria meningitidis NOT DETECTED NOT DETECTED   Pseudomonas aeruginosa NOT DETECTED NOT DETECTED   Candida albicans NOT DETECTED NOT DETECTED   Candida glabrata NOT DETECTED NOT DETECTED   Candida krusei NOT DETECTED NOT DETECTED   Candida parapsilosis NOT DETECTED NOT DETECTED   Candida tropicalis NOT DETECTED NOT DETECTED  CBC with Differential/Platelet  Result Value Ref Range   WBC 5.6 3.6 - 11.0 K/uL   RBC 4.19 3.80 - 5.20 MIL/uL   Hemoglobin 12.5 12.0 - 16.0 g/dL   HCT 37.1 35.0 - 47.0 %   MCV 88.7 80.0 - 100.0 fL   MCH 30.0 26.0 - 34.0 pg   MCHC 33.8 32.0 - 36.0 g/dL   RDW 14.2 11.5 - 14.5 %   Platelets 208 150 - 440 K/uL   Neutrophils Relative % 77 %   Neutro Abs 4.3 1.4 - 6.5 K/uL   Lymphocytes Relative 9 %   Lymphs Abs 0.5 (L) 1.0 - 3.6 K/uL   Monocytes Relative 14 %   Monocytes Absolute 0.8 0.2 - 0.9 K/uL   Eosinophils Relative 0 %   Eosinophils Absolute 0.0 0 - 0.7 K/uL   Basophils Relative 0 %   Basophils Absolute 0.0 0 - 0.1 K/uL  Comprehensive metabolic panel  Result Value Ref Range   Sodium 134 (L) 135 - 145 mmol/L   Potassium 3.2 (L) 3.5 - 5.1 mmol/L   Chloride 97 (L) 101 - 111 mmol/L   CO2 30 22 - 32 mmol/L   Glucose, Bld 120 (H) 65 - 99 mg/dL   BUN 7 6 - 20 mg/dL   Creatinine, Ser 0.76 0.44 - 1.00 mg/dL   Calcium 8.0 (L) 8.9 - 10.3 mg/dL   Total Protein 7.3 6.5 - 8.1 g/dL   Albumin 4.0 3.5 - 5.0 g/dL   AST 26 15 - 41 U/L   ALT 15 14 - 54 U/L   Alkaline Phosphatase 37 (L) 38 - 126 U/L   Total Bilirubin 0.6 0.3 - 1.2 mg/dL   GFR calc non Af Amer >60 >60 mL/min   GFR calc Af Amer >60 >60 mL/min   Anion gap 7 5 - 15  Lactic acid, plasma  Result Value Ref Range   Lactic Acid, Venous 1.1 0.5 - 1.9 mmol/L  Blood gas, venous ([facility], [facility], [facility])  Result Value Ref Range   pH, Ven 7.35 7.250  - 7.430   pCO2, Ven 59 44.0 - 60.0 mmHg   pO2, Ven <31.0 (LL) 32.0 - 45.0 mmHg   Bicarbonate 32.6 (H) 20.0 - 28.0 mmol/L   Acid-Base Excess 5.3 (H) 0.0 - 2.0 mmol/L   Patient temperature 37.0  Collection site VEIN    Sample type VENOUS   Urinalysis, Complete w Microscopic  Result Value Ref Range   Color, Urine YELLOW (A) YELLOW   APPearance CLEAR (A) CLEAR   Specific Gravity, Urine 1.008 1.005 - 1.030   pH 6.0 5.0 - 8.0   Glucose, UA 50 (A) NEGATIVE mg/dL   Hgb urine dipstick LARGE (A) NEGATIVE   Bilirubin Urine NEGATIVE NEGATIVE   Ketones, ur NEGATIVE NEGATIVE mg/dL   Protein, ur NEGATIVE NEGATIVE mg/dL   Nitrite NEGATIVE NEGATIVE   Leukocytes, UA NEGATIVE NEGATIVE   RBC / HPF NONE SEEN 0 - 5 RBC/hpf   WBC, UA 0-5 0 - 5 WBC/hpf   Bacteria, UA NONE SEEN NONE SEEN   Squamous Epithelial / LPF NONE SEEN NONE SEEN   Mucous PRESENT   Influenza panel by PCR (type A & B, H1N1)  Result Value Ref Range   Influenza A By PCR NEGATIVE NEGATIVE   Influenza B By PCR NEGATIVE NEGATIVE  Basic metabolic panel  Result Value Ref Range   Sodium 140 135 - 145 mmol/L   Potassium 3.9 3.5 - 5.1 mmol/L   Chloride 106 101 - 111 mmol/L   CO2 29 22 - 32 mmol/L   Glucose, Bld 161 (H) 65 - 99 mg/dL   BUN 7 6 - 20 mg/dL   Creatinine, Ser 0.57 0.44 - 1.00 mg/dL   Calcium 7.7 (L) 8.9 - 10.3 mg/dL   GFR calc non Af Amer >60 >60 mL/min   GFR calc Af Amer >60 >60 mL/min   Anion gap 5 5 - 15  CBC  Result Value Ref Range   WBC 4.6 3.6 - 11.0 K/uL   RBC 4.00 3.80 - 5.20 MIL/uL   Hemoglobin 12.2 12.0 - 16.0 g/dL   HCT 35.9 35.0 - 47.0 %   MCV 89.7 80.0 - 100.0 fL   MCH 30.6 26.0 - 34.0 pg   MCHC 34.1 32.0 - 36.0 g/dL   RDW 14.1 11.5 - 14.5 %   Platelets 177 150 - 440 K/uL  Hemoglobin A1c  Result Value Ref Range   Hgb A1c MFr Bld 5.7 (H) 4.8 - 5.6 %   Mean Plasma Glucose 117 mg/dL      Assessment & Plan:   Problem List Items Addressed This Visit      Respiratory   COPD exacerbation (HCC) -  Primary    Resolved. Cannot get the symbicort approved so we will switch to advair. Call with any concerns or if not getting better.       Relevant Medications   nicotine polacrilex (EQL NICOTINE POLACRILEX) 4 MG lozenge   nicotine (NICODERM CQ - DOSED IN MG/24 HOURS) 14 mg/24hr patch   Fluticasone-Salmeterol (ADVAIR DISKUS) 250-50 MCG/DOSE AEPB     Other   Tobacco abuse    Will start her on wellbutrin and nicotine lozenges and patches. Call with any concerns. Recheck 1 month.        Other Visit Diagnoses    Acute cystitis without hematuria       Will treat with cipro. Call with any concerns or if not getting better.    Dysuria       Checking UA, await results.    Relevant Orders   UA/M w/rflx Culture, Routine       Follow up plan: Return in about 4 weeks (around 03/31/2016) for smoking follow up.

## 2016-03-03 NOTE — Patient Instructions (Addendum)
Steps to Quit Smoking Smoking tobacco can be bad for your health. It can also affect almost every organ in your body. Smoking puts you and people around you at risk for many serious long-lasting (chronic) diseases. Quitting smoking is hard, but it is one of the best things that you can do for your health. It is never too late to quit. What are the benefits of quitting smoking? When you quit smoking, you lower your risk for getting serious diseases and conditions. They can include:  Lung cancer or lung disease.  Heart disease.  Stroke.  Heart attack.  Not being able to have children (infertility).  Weak bones (osteoporosis) and broken bones (fractures). If you have coughing, wheezing, and shortness of breath, those symptoms may get better when you quit. You may also get sick less often. If you are pregnant, quitting smoking can help to lower your chances of having a baby of low birth weight. What can I do to help me quit smoking? Talk with your doctor about what can help you quit smoking. Some things you can do (strategies) include:  Quitting smoking totally, instead of slowly cutting back how much you smoke over a period of time.  Going to in-person counseling. You are more likely to quit if you go to many counseling sessions.  Using resources and support systems, such as:  Online chats with a counselor.  Phone quitlines.  Printed self-help materials.  Support groups or group counseling.  Text messaging programs.  Mobile phone apps or applications.  Taking medicines. Some of these medicines may have nicotine in them. If you are pregnant or breastfeeding, do not take any medicines to quit smoking unless your doctor says it is okay. Talk with your doctor about counseling or other things that can help you. Talk with your doctor about using more than one strategy at the same time, such as taking medicines while you are also going to in-person counseling. This can help make quitting  easier. What things can I do to make it easier to quit? Quitting smoking might feel very hard at first, but there is a lot that you can do to make it easier. Take these steps:  Talk to your family and friends. Ask them to support and encourage you.  Call phone quitlines, reach out to support groups, or work with a counselor.  Ask people who smoke to not smoke around you.  Avoid places that make you want (trigger) to smoke, such as:  Bars.  Parties.  Smoke-break areas at work.  Spend time with people who do not smoke.  Lower the stress in your life. Stress can make you want to smoke. Try these things to help your stress:  Getting regular exercise.  Deep-breathing exercises.  Yoga.  Meditating.  Doing a body scan. To do this, close your eyes, focus on one area of your body at a time from head to toe, and notice which parts of your body are tense. Try to relax the muscles in those areas.  Download or buy apps on your mobile phone or tablet that can help you stick to your quit plan. There are many free apps, such as QuitGuide from the CDC (Centers for Disease Control and Prevention). You can find more support from smokefree.gov and other websites. This information is not intended to replace advice given to you by your health care provider. Make sure you discuss any questions you have with your health care provider. Document Released: 11/22/2008 Document Revised: 09/24/2015 Document   Reviewed: 06/12/2014 Elsevier Interactive Patient Education  2017 Elsevier Inc.  

## 2016-03-03 NOTE — Assessment & Plan Note (Signed)
Resolved. Cannot get the symbicort approved so we will switch to advair. Call with any concerns or if not getting better.

## 2016-03-03 NOTE — Assessment & Plan Note (Signed)
Will start her on wellbutrin and nicotine lozenges and patches. Call with any concerns. Recheck 1 month.

## 2016-03-04 ENCOUNTER — Telehealth: Payer: Self-pay | Admitting: Family Medicine

## 2016-03-04 ENCOUNTER — Other Ambulatory Visit: Payer: Self-pay | Admitting: Family Medicine

## 2016-03-04 MED ORDER — NICOTINE 21 MG/24HR TD PT24
21.0000 mg | MEDICATED_PATCH | Freq: Every day | TRANSDERMAL | 1 refills | Status: DC
Start: 2016-03-04 — End: 2017-07-06

## 2016-03-04 NOTE — Telephone Encounter (Signed)
Pt called and stated that instead of the nicotine (NICODERM CQ - DOSED IN MG/24 HOURS) 14 mg/24hr patch she would like 21mg . And she would like to check on the status of the PA for her inhaler.

## 2016-03-04 NOTE — Telephone Encounter (Signed)
Rx sent to her pharmacy. Not sure if she's talking about the symbicort or the advair I sent in yesterday.

## 2016-03-04 NOTE — Telephone Encounter (Signed)
Called and left a message for patient to let me know what inhaler needs a prior authorization.   Dr.Johnson, Can you change her nicotine prescription?

## 2016-03-06 NOTE — Telephone Encounter (Signed)
Prior authorization approved

## 2016-03-08 LAB — UA/M W/RFLX CULTURE, ROUTINE
Bilirubin, UA: NEGATIVE
Glucose, UA: NEGATIVE
Ketones, UA: NEGATIVE
Nitrite, UA: NEGATIVE
Specific Gravity, UA: 1.01 (ref 1.005–1.030)
Urobilinogen, Ur: 0.2 mg/dL (ref 0.2–1.0)
pH, UA: 6 (ref 5.0–7.5)

## 2016-03-08 LAB — MICROSCOPIC EXAMINATION

## 2016-03-08 LAB — URINE CULTURE, REFLEX

## 2016-03-09 ENCOUNTER — Telehealth: Payer: Self-pay | Admitting: Family Medicine

## 2016-03-09 DIAGNOSIS — J301 Allergic rhinitis due to pollen: Secondary | ICD-10-CM | POA: Insufficient documentation

## 2016-03-09 DIAGNOSIS — J342 Deviated nasal septum: Secondary | ICD-10-CM | POA: Insufficient documentation

## 2016-03-09 MED ORDER — NITROFURANTOIN MONOHYD MACRO 100 MG PO CAPS: 100.0000 mg | ORAL_CAPSULE | Freq: Two times a day (BID) | ORAL | 0 refills | Status: DC

## 2016-03-09 NOTE — Telephone Encounter (Signed)
Patient notified

## 2016-03-09 NOTE — Telephone Encounter (Signed)
Lease let her know that her bacteria was resistant to the antibiotic we gave her, so I've sent a new one to her pharmacy.

## 2016-03-13 ENCOUNTER — Telehealth: Payer: Self-pay | Admitting: Family Medicine

## 2016-03-13 NOTE — Telephone Encounter (Signed)
Noted  

## 2016-03-13 NOTE — Telephone Encounter (Signed)
Spoke with patient and looked in [place]. Patient's appointment is already scheduled for 03/16/16 with Dr. Manuella Ghazi. No new referral is needed.   Explained to patient that she was already established with Dr. Manuella Ghazi.   Also spoke to patient regarding her Neurosurgery appointment that I've been trying to reach over. (see referral for further details) Patient said that she hasn't received any phone calls or voicemails. Explained to patient that she needed to call [facility]. Number given. Patient said she would.

## 2016-03-13 NOTE — Telephone Encounter (Signed)
Patient called because she is feeling worse with her sore throat and hoarseness.  She is wanting a referral put in for her to the [facility] clinic with Dr Manuella Ghazi at [place].   She says she could get in Monday at 1:00 if we could get the referral to them ASAP.  Thanks  [name]  [phone] Becky Hubbard if you need to reach her

## 2016-03-17 ENCOUNTER — Ambulatory Visit: Payer: Medicaid Other | Admitting: Family Medicine

## 2016-03-17 ENCOUNTER — Ambulatory Visit: Payer: Self-pay | Admitting: Family Medicine

## 2016-03-23 ENCOUNTER — Telehealth: Payer: Self-pay | Admitting: Family Medicine

## 2016-03-23 NOTE — Telephone Encounter (Signed)
Patient called to see if Dr Wynetta Emery could switch her Advair to something different because the Advair causes her to have dry mouth.  Thanks    Phar [facility]  [phone] [name]

## 2016-03-23 NOTE — Telephone Encounter (Signed)
Routing to provider  

## 2016-03-23 NOTE — Telephone Encounter (Signed)
Tried calling patient. VM box full. Will try again later.

## 2016-03-23 NOTE — Telephone Encounter (Signed)
Unfortunately with her insurance, I'm not sure we can. I recommend lemon drops for the dry mouth and make sure she's rinsing her mouth out after each use.

## 2016-03-24 NOTE — Telephone Encounter (Signed)
Patient notified

## 2016-04-28 ENCOUNTER — Other Ambulatory Visit: Payer: Self-pay | Admitting: Family Medicine

## 2016-05-06 ENCOUNTER — Ambulatory Visit: Payer: Self-pay | Admitting: Family Medicine

## 2016-05-07 ENCOUNTER — Ambulatory Visit: Payer: Medicaid Other | Admitting: Family Medicine

## 2016-05-12 ENCOUNTER — Other Ambulatory Visit: Payer: Self-pay | Admitting: Family Medicine

## 2016-05-25 ENCOUNTER — Telehealth: Payer: Self-pay

## 2016-05-25 MED ORDER — ALBUTEROL SULFATE HFA 108 (90 BASE) MCG/ACT IN AERS: INHALATION_SPRAY | RESPIRATORY_TRACT | 1 refills | Status: DC

## 2016-05-25 NOTE — Telephone Encounter (Signed)
Pharmacy is requesting a refill on Proair

## 2016-06-11 ENCOUNTER — Telehealth: Payer: Self-pay | Admitting: Family Medicine

## 2016-06-11 NOTE — Telephone Encounter (Signed)
Felicia from [facility] called in regards to getting authorization for patient to be seen in their office. Please Advise.   [place] contact number: [phone]  Thank you.

## 2016-06-11 NOTE — Telephone Encounter (Signed)
Returned call.  Patient has medicaid. NPI given.

## 2016-06-22 ENCOUNTER — Telehealth: Payer: Self-pay | Admitting: Family Medicine

## 2016-06-22 DIAGNOSIS — Z01419 Encounter for gynecological examination (general) (routine) without abnormal findings: Secondary | ICD-10-CM

## 2016-06-22 NOTE — Telephone Encounter (Signed)
Pt would like a referral to go to gyn in [place].

## 2016-06-22 NOTE — Telephone Encounter (Signed)
Routing to provider. Left message for patient to call with her reason for the referral.

## 2016-06-22 NOTE — Telephone Encounter (Signed)
Referral generated

## 2016-06-23 ENCOUNTER — Other Ambulatory Visit: Payer: Self-pay | Admitting: Family Medicine

## 2016-06-23 ENCOUNTER — Telehealth: Payer: Self-pay | Admitting: Obstetrics & Gynecology

## 2016-06-23 MED ORDER — CYCLOBENZAPRINE HCL 10 MG PO TABS: ORAL_TABLET | ORAL | 6 refills | Status: DC

## 2016-06-23 NOTE — Telephone Encounter (Signed)
Pt is being referred by [facility] for gynecological examination without abnormal finding. Called and left voicemail to have pt to call back to be schedule.

## 2016-06-25 NOTE — Telephone Encounter (Signed)
Pt is schedule 07/23/16 with Dr. Glennon Mac

## 2016-07-10 ENCOUNTER — Telehealth: Payer: Self-pay | Admitting: Family Medicine

## 2016-07-10 NOTE — Telephone Encounter (Signed)
Called and left message for Cherie to return my call. 6 visits.

## 2016-07-10 NOTE — Telephone Encounter (Signed)
Cherie from [facility] would like to know how many visits will be covered for patient to see DR. Dumonski for her lower back.   Please Advise  Thank you

## 2016-07-13 ENCOUNTER — Other Ambulatory Visit: Payer: Self-pay | Admitting: Family Medicine

## 2016-07-13 ENCOUNTER — Ambulatory Visit: Payer: Medicaid Other | Admitting: Family Medicine

## 2016-07-13 NOTE — Telephone Encounter (Signed)
Called again and spoke with [facility].  Already documented 6 visits. Patient hasn't been seen yet, so she hasn't used any of them yet.

## 2016-07-23 ENCOUNTER — Ambulatory Visit: Payer: Medicaid Other | Admitting: Obstetrics and Gynecology

## 2016-08-11 ENCOUNTER — Ambulatory Visit: Payer: Self-pay | Admitting: Obstetrics and Gynecology

## 2016-08-14 ENCOUNTER — Other Ambulatory Visit: Payer: Self-pay | Admitting: Family Medicine

## 2016-09-04 ENCOUNTER — Other Ambulatory Visit: Payer: Self-pay | Admitting: Family Medicine

## 2016-09-15 ENCOUNTER — Telehealth: Payer: Self-pay

## 2016-09-15 ENCOUNTER — Ambulatory Visit: Payer: Self-pay | Admitting: Obstetrics and Gynecology

## 2016-09-15 NOTE — Telephone Encounter (Signed)
I never write for any of the name brand inhalers. It should be under there as albuterol HFA- they can give her whatever is covered. She does not need to have proair.

## 2016-09-15 NOTE — Telephone Encounter (Signed)
Received a PA for Proair HFA.  However, the medication is not in the [employer] system for me to do a PA. Is there another name we change it to?

## 2016-09-17 ENCOUNTER — Ambulatory Visit: Payer: Self-pay | Admitting: Family Medicine

## 2016-09-17 NOTE — Telephone Encounter (Signed)
Spoke with Judeen Hammans from [employer]. The PA was sent in error due to patient's medicaid had lapsed.  But they figured everything out.

## 2016-09-24 ENCOUNTER — Telehealth: Payer: Self-pay | Admitting: Family Medicine

## 2016-09-24 DIAGNOSIS — Z01419 Encounter for gynecological examination (general) (routine) without abnormal findings: Secondary | ICD-10-CM

## 2016-09-24 NOTE — Telephone Encounter (Signed)
Becky Hubbard with [facility] called and stated that the pt had no showed and canceled several appts and will need a new referral to continue care.

## 2016-09-24 NOTE — Telephone Encounter (Signed)
Routing to provider. What should we do about this?

## 2016-09-28 NOTE — Telephone Encounter (Signed)
New referral generated. Can we please check to make sure patient still wants to see GYN (we can do her paps) and if she does, we can send referral. Thanks!

## 2016-09-28 NOTE — Telephone Encounter (Signed)
Called and left a message with patients mother, asking her to return my call.

## 2016-09-29 NOTE — Telephone Encounter (Signed)
Called patient, no answer, left a message for patient to return my call.  

## 2016-09-30 NOTE — Telephone Encounter (Signed)
Called patient no answer, left message for patient to return my call.

## 2016-10-02 NOTE — Telephone Encounter (Signed)
Will discuss with patient at her follow up. No referral to GYN to be placed right now.

## 2016-10-06 ENCOUNTER — Telehealth: Payer: Self-pay | Admitting: Family Medicine

## 2016-10-06 NOTE — Telephone Encounter (Signed)
Patient would like Dr Wynetta Emery to put in a referral to her Gastrologist Dr Gifford Shave at [facility]

## 2016-10-06 NOTE — Telephone Encounter (Signed)
Spoke with patient. She is having stomach pain and swelling. She has not been seen for this issue before. Pt scheduled to see Dr. Wynetta Emery to discuss next Thursday 10/15/16 @ 2:30

## 2016-10-15 ENCOUNTER — Ambulatory Visit: Payer: Self-pay | Admitting: Family Medicine

## 2016-10-28 ENCOUNTER — Telehealth: Payer: Self-pay | Admitting: Family Medicine

## 2016-10-28 DIAGNOSIS — K219 Gastro-esophageal reflux disease without esophagitis: Secondary | ICD-10-CM

## 2016-10-28 NOTE — Telephone Encounter (Signed)
Patient is having problem with her esophagus burning and is requesting a referral to GI --Dr Larene Pickett at [facility]  She can be reached at [phone]  Thank You

## 2016-10-28 NOTE — Telephone Encounter (Signed)
Routing to provider  

## 2016-10-29 NOTE — Telephone Encounter (Signed)
Called and let patient know that referral was entered.

## 2016-11-12 ENCOUNTER — Telehealth: Payer: Self-pay | Admitting: Family Medicine

## 2016-11-12 NOTE — Telephone Encounter (Signed)
Entered in error

## 2016-11-17 ENCOUNTER — Ambulatory Visit: Payer: Medicaid Other | Admitting: Family Medicine

## 2016-11-25 ENCOUNTER — Ambulatory Visit: Payer: Self-pay | Admitting: Obstetrics and Gynecology

## 2016-12-16 ENCOUNTER — Ambulatory Visit: Payer: Medicaid Other | Admitting: Family Medicine

## 2016-12-16 ENCOUNTER — Ambulatory Visit: Payer: Self-pay | Admitting: Obstetrics and Gynecology

## 2016-12-21 ENCOUNTER — Ambulatory Visit: Payer: Medicaid Other | Admitting: Family Medicine

## 2016-12-21 ENCOUNTER — Ambulatory Visit: Payer: Self-pay | Admitting: Family Medicine

## 2017-01-07 ENCOUNTER — Ambulatory Visit: Payer: Medicaid Other | Admitting: Family Medicine

## 2017-01-08 ENCOUNTER — Ambulatory Visit: Payer: Self-pay | Admitting: Family Medicine

## 2017-01-11 ENCOUNTER — Other Ambulatory Visit: Payer: Self-pay | Admitting: Family Medicine

## 2017-01-22 ENCOUNTER — Ambulatory Visit: Payer: Medicaid Other | Admitting: Family Medicine

## 2017-01-26 ENCOUNTER — Telehealth: Payer: Self-pay | Admitting: Family Medicine

## 2017-01-26 ENCOUNTER — Ambulatory Visit: Payer: Medicaid Other | Admitting: Family Medicine

## 2017-01-26 NOTE — Telephone Encounter (Signed)
Becky Hubbard- please see no-shows/cancellations. Unfortunately, I think she needs a D/C letter for chronic no-shows.

## 2017-01-28 ENCOUNTER — Encounter: Payer: Self-pay | Admitting: Family Medicine

## 2017-01-29 NOTE — Telephone Encounter (Signed)
Patient dismissed.  Mailing letter today.

## 2017-03-03 ENCOUNTER — Other Ambulatory Visit: Payer: Self-pay | Admitting: Family Medicine

## 2017-05-13 ENCOUNTER — Telehealth: Payer: Self-pay

## 2017-05-13 ENCOUNTER — Ambulatory Visit: Payer: Medicaid Other | Admitting: Obstetrics and Gynecology

## 2017-05-13 NOTE — Telephone Encounter (Signed)
Copied from [place] [phone]. Topic: Referral - Request >> May 13, 2017 12:42 PM Scherrie Gerlach wrote: Reason for CRM: [facility] calling to request a new referral and records again for this pt.  Pt last referred a year ago, but kept rescheduling and not showing. They are going to give the pt one more chance for an appt, but it has been so long, they no longer have the records. (Can send through epic) Fax  [phone]

## 2017-05-13 NOTE — Telephone Encounter (Signed)
Contacted [facility] and spoke with Judson Roch. Sarah notified patient has been dismissed from this practice, so services from Dr. Wynetta Emery cannot be done.   Judson Roch was very understanding and was making a note of what this information.

## 2017-05-19 ENCOUNTER — Ambulatory Visit: Payer: Self-pay | Admitting: Obstetrics and Gynecology

## 2017-05-19 ENCOUNTER — Ambulatory Visit: Payer: Medicaid Other | Admitting: Obstetrics and Gynecology

## 2017-05-21 ENCOUNTER — Other Ambulatory Visit: Payer: Self-pay | Admitting: Family Medicine
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
## 2017-06-28 ENCOUNTER — Ambulatory Visit: Payer: Medicaid Other | Admitting: Advanced Practice Midwife

## 2017-07-02 ENCOUNTER — Other Ambulatory Visit: Payer: Self-pay | Admitting: Family Medicine

## 2017-07-04 ENCOUNTER — Other Ambulatory Visit: Payer: Self-pay

## 2017-07-04 ENCOUNTER — Emergency Department: Payer: Medicaid Other

## 2017-07-04 ENCOUNTER — Encounter: Payer: Self-pay | Admitting: Emergency Medicine

## 2017-07-04 ENCOUNTER — Inpatient Hospital Stay
Admission: EM | Admit: 2017-07-04 | Discharge: 2017-07-06 | DRG: 871 | Disposition: A | Discharge status: Home / Self Care | Payer: Medicaid Other | Attending: Internal Medicine | Admitting: Internal Medicine

## 2017-07-04 DIAGNOSIS — F988 Other specified behavioral and emotional disorders with onset usually occurring in childhood and adolescence: Secondary | ICD-10-CM | POA: Diagnosis present

## 2017-07-04 DIAGNOSIS — Z7951 Long term (current) use of inhaled steroids: Secondary | ICD-10-CM

## 2017-07-04 DIAGNOSIS — K219 Gastro-esophageal reflux disease without esophagitis: Secondary | ICD-10-CM | POA: Diagnosis present

## 2017-07-04 DIAGNOSIS — H9190 Unspecified hearing loss, unspecified ear: Secondary | ICD-10-CM | POA: Diagnosis present

## 2017-07-04 DIAGNOSIS — J189 Pneumonia, unspecified organism: Secondary | ICD-10-CM

## 2017-07-04 DIAGNOSIS — G8929 Other chronic pain: Secondary | ICD-10-CM | POA: Diagnosis present

## 2017-07-04 DIAGNOSIS — G9340 Encephalopathy, unspecified: Secondary | ICD-10-CM

## 2017-07-04 DIAGNOSIS — G5603 Carpal tunnel syndrome, bilateral upper limbs: Secondary | ICD-10-CM | POA: Diagnosis present

## 2017-07-04 DIAGNOSIS — Z8719 Personal history of other diseases of the digestive system: Secondary | ICD-10-CM | POA: Diagnosis not present

## 2017-07-04 DIAGNOSIS — F319 Bipolar disorder, unspecified: Secondary | ICD-10-CM | POA: Diagnosis present

## 2017-07-04 DIAGNOSIS — M479 Spondylosis, unspecified: Secondary | ICD-10-CM | POA: Diagnosis present

## 2017-07-04 DIAGNOSIS — M797 Fibromyalgia: Secondary | ICD-10-CM | POA: Diagnosis present

## 2017-07-04 DIAGNOSIS — Z915 Personal history of self-harm: Secondary | ICD-10-CM

## 2017-07-04 DIAGNOSIS — M549 Dorsalgia, unspecified: Secondary | ICD-10-CM | POA: Diagnosis present

## 2017-07-04 DIAGNOSIS — Z880 Allergy status to penicillin: Secondary | ICD-10-CM

## 2017-07-04 DIAGNOSIS — Z8639 Personal history of other endocrine, nutritional and metabolic disease: Secondary | ICD-10-CM

## 2017-07-04 DIAGNOSIS — Z818 Family history of other mental and behavioral disorders: Secondary | ICD-10-CM

## 2017-07-04 DIAGNOSIS — M542 Cervicalgia: Secondary | ICD-10-CM | POA: Diagnosis present

## 2017-07-04 DIAGNOSIS — A419 Sepsis, unspecified organism: Secondary | ICD-10-CM | POA: Diagnosis present

## 2017-07-04 DIAGNOSIS — F419 Anxiety disorder, unspecified: Secondary | ICD-10-CM | POA: Diagnosis present

## 2017-07-04 DIAGNOSIS — Z8659 Personal history of other mental and behavioral disorders: Secondary | ICD-10-CM

## 2017-07-04 DIAGNOSIS — G9341 Metabolic encephalopathy: Secondary | ICD-10-CM | POA: Diagnosis not present

## 2017-07-04 DIAGNOSIS — G2581 Restless legs syndrome: Secondary | ICD-10-CM | POA: Diagnosis present

## 2017-07-04 DIAGNOSIS — Z888 Allergy status to other drugs, medicaments and biological substances status: Secondary | ICD-10-CM

## 2017-07-04 DIAGNOSIS — F429 Obsessive-compulsive disorder, unspecified: Secondary | ICD-10-CM | POA: Diagnosis present

## 2017-07-04 DIAGNOSIS — F17210 Nicotine dependence, cigarettes, uncomplicated: Secondary | ICD-10-CM | POA: Diagnosis present

## 2017-07-04 DIAGNOSIS — R000 Tachycardia, unspecified: Secondary | ICD-10-CM

## 2017-07-04 DIAGNOSIS — M1990 Unspecified osteoarthritis, unspecified site: Secondary | ICD-10-CM | POA: Diagnosis present

## 2017-07-04 DIAGNOSIS — Z9109 Other allergy status, other than to drugs and biological substances: Secondary | ICD-10-CM | POA: Diagnosis not present

## 2017-07-04 DIAGNOSIS — F4310 Post-traumatic stress disorder, unspecified: Secondary | ICD-10-CM | POA: Diagnosis present

## 2017-07-04 DIAGNOSIS — Z79899 Other long term (current) drug therapy: Secondary | ICD-10-CM

## 2017-07-04 DIAGNOSIS — Z881 Allergy status to other antibiotic agents status: Secondary | ICD-10-CM

## 2017-07-04 DIAGNOSIS — R410 Disorientation, unspecified: Secondary | ICD-10-CM

## 2017-07-04 DIAGNOSIS — Z882 Allergy status to sulfonamides status: Secondary | ICD-10-CM

## 2017-07-04 DIAGNOSIS — Z792 Long term (current) use of antibiotics: Secondary | ICD-10-CM

## 2017-07-04 LAB — COMPREHENSIVE METABOLIC PANEL
ALT: 30 U/L (ref 14–54)
AST: 40 U/L (ref 15–41)
Albumin: 4.5 g/dL (ref 3.5–5.0)
Alkaline Phosphatase: 53 U/L (ref 38–126)
Anion gap: 12 (ref 5–15)
BUN: 13 mg/dL (ref 6–20)
CO2: 23 mmol/L (ref 22–32)
Calcium: 9.7 mg/dL (ref 8.9–10.3)
Chloride: 104 mmol/L (ref 101–111)
Creatinine, Ser: 0.62 mg/dL (ref 0.44–1.00)
GFR calc Af Amer: >60 mL/min (ref >60)
GFR calc non Af Amer: >60 mL/min (ref >60)
Glucose, Bld: 161 mg/dL — ABNORMAL HIGH (ref 65–99)
Potassium: 3.9 mmol/L (ref 3.5–5.1)
Sodium: 139 mmol/L (ref 135–145)
Total Bilirubin: 0.4 mg/dL (ref 0.3–1.2)
Total Protein: 8.5 g/dL — ABNORMAL HIGH (ref 6.5–8.1)

## 2017-07-04 LAB — URINALYSIS, COMPLETE (UACMP) WITH MICROSCOPIC
Bacteria, UA: NONE SEEN
Bilirubin Urine: NEGATIVE
Glucose, UA: NEGATIVE mg/dL
Hgb urine dipstick: NEGATIVE
Ketones, ur: NEGATIVE mg/dL
Leukocytes, UA: NEGATIVE
Nitrite: NEGATIVE
Protein, ur: NEGATIVE mg/dL
Specific Gravity, Urine: 1.008 (ref 1.005–1.030)
Squamous Epithelial / LPF: NONE SEEN (ref 0–5)
WBC, UA: NONE SEEN WBC/hpf (ref 0–5)
pH: 6 (ref 5.0–8.0)

## 2017-07-04 LAB — URINE DRUG SCREEN, QUALITATIVE (ARMC ONLY)
Amphetamines, Ur Screen: NOT DETECTED
Barbiturates, Ur Screen: NOT DETECTED
Benzodiazepine, Ur Scrn: POSITIVE — AB
Cannabinoid 50 Ng, Ur ~~LOC~~: NOT DETECTED
Cocaine Metabolite,Ur ~~LOC~~: NOT DETECTED
MDMA (Ecstasy)Ur Screen: NOT DETECTED
Methadone Scn, Ur: NOT DETECTED
Opiate, Ur Screen: NOT DETECTED
Phencyclidine (PCP) Ur S: NOT DETECTED
Tricyclic, Ur Screen: POSITIVE — AB

## 2017-07-04 LAB — CBC
HCT: 37.7 % (ref 35.0–47.0)
Hemoglobin: 12.8 g/dL (ref 12.0–16.0)
MCH: 29 pg (ref 26.0–34.0)
MCHC: 33.8 g/dL (ref 32.0–36.0)
MCV: 85.9 fL (ref 80.0–100.0)
Platelets: 235 10*3/uL (ref 150–440)
RBC: 4.4 MIL/uL (ref 3.80–5.20)
RDW: 13.9 % (ref 11.5–14.5)
WBC: 16 10*3/uL — ABNORMAL HIGH (ref 3.6–11.0)

## 2017-07-04 LAB — PREGNANCY, URINE: Preg Test, Ur: NEGATIVE

## 2017-07-04 LAB — TROPONIN I: Troponin I: <0.03 ng/mL (ref <0.03)

## 2017-07-04 LAB — LACTIC ACID, PLASMA
Lactic Acid, Venous: 2.2 mmol/L (ref 0.5–1.9)
Lactic Acid, Venous: 2.4 mmol/L (ref 0.5–1.9)

## 2017-07-04 LAB — TSH: TSH: 2.409 u[IU]/mL (ref 0.350–4.500)

## 2017-07-04 MED ORDER — ALBUTEROL SULFATE (2.5 MG/3ML) 0.083% IN NEBU: 3.0000 mL | INHALATION_SOLUTION | RESPIRATORY_TRACT | Status: DC | PRN

## 2017-07-04 MED ORDER — BISACODYL 10 MG RE SUPP: 10.0000 mg | Freq: Every day | RECTAL | Status: DC | PRN

## 2017-07-04 MED ORDER — CYCLOBENZAPRINE HCL 10 MG PO TABS
10.0000 mg | ORAL_TABLET | Freq: Three times a day (TID) | ORAL | Status: DC | PRN
  Administered 2017-07-05: 10 mg via ORAL
  Filled 2017-07-04: qty 1

## 2017-07-04 MED ORDER — DOCUSATE SODIUM 100 MG PO CAPS
100.0000 mg | ORAL_CAPSULE | Freq: Two times a day (BID) | ORAL | Status: DC
  Administered 2017-07-04 – 2017-07-06 (×4): 100 mg via ORAL
  Filled 2017-07-04 (×4): qty 1

## 2017-07-04 MED ORDER — LEVOFLOXACIN IN D5W 750 MG/150ML IV SOLN
750.0000 mg | INTRAVENOUS | Status: DC
  Administered 2017-07-05: 750 mg via INTRAVENOUS
  Filled 2017-07-04 (×2): qty 150

## 2017-07-04 MED ORDER — MOMETASONE FURO-FORMOTEROL FUM 200-5 MCG/ACT IN AERO
2.0000 | INHALATION_SPRAY | Freq: Two times a day (BID) | RESPIRATORY_TRACT | Status: DC
  Administered 2017-07-04 – 2017-07-06 (×4): 2 via RESPIRATORY_TRACT
  Filled 2017-07-04: qty 8.8

## 2017-07-04 MED ORDER — SODIUM CHLORIDE 0.9 % IV SOLN
INTRAVENOUS | Status: DC
  Administered 2017-07-04: 17:00:00 via INTRAVENOUS
  Administered 2017-07-04 – 2017-07-05 (×2): 100 mL/h via INTRAVENOUS

## 2017-07-04 MED ORDER — IPRATROPIUM-ALBUTEROL 0.5-2.5 (3) MG/3ML IN SOLN
3.0000 mL | Freq: Four times a day (QID) | RESPIRATORY_TRACT | Status: DC
  Administered 2017-07-04: 3 mL via RESPIRATORY_TRACT
  Filled 2017-07-04: qty 3

## 2017-07-04 MED ORDER — ONDANSETRON HCL 4 MG/2ML IJ SOLN: 4.0000 mg | Freq: Four times a day (QID) | INTRAMUSCULAR | Status: DC | PRN

## 2017-07-04 MED ORDER — HEPARIN SODIUM (PORCINE) 5000 UNIT/ML IJ SOLN
5000.0000 [IU] | Freq: Three times a day (TID) | INTRAMUSCULAR | Status: DC
  Administered 2017-07-05 – 2017-07-06 (×4): 5000 [IU] via SUBCUTANEOUS
  Filled 2017-07-04 (×5): qty 1

## 2017-07-04 MED ORDER — ASPIRIN EC 81 MG PO TBEC
81.0000 mg | DELAYED_RELEASE_TABLET | Freq: Every day | ORAL | Status: DC
  Administered 2017-07-05 – 2017-07-06 (×2): 81 mg via ORAL
  Filled 2017-07-04 (×2): qty 1

## 2017-07-04 MED ORDER — ACETAMINOPHEN 325 MG PO TABS
650.0000 mg | ORAL_TABLET | Freq: Four times a day (QID) | ORAL | Status: DC | PRN
  Administered 2017-07-05: 650 mg via ORAL
  Filled 2017-07-04: qty 2

## 2017-07-04 MED ORDER — ONDANSETRON HCL 4 MG PO TABS: 4.0000 mg | ORAL_TABLET | Freq: Four times a day (QID) | ORAL | Status: DC | PRN

## 2017-07-04 MED ORDER — HYDROCOD POLST-CPM POLST ER 10-8 MG/5ML PO SUER
5.0000 mL | Freq: Two times a day (BID) | ORAL | Status: DC | PRN
Start: 2017-07-04 — End: 2017-07-06
  Administered 2017-07-05: 5 mL via ORAL
  Filled 2017-07-04: qty 5

## 2017-07-04 MED ORDER — RISPERIDONE 1 MG PO TABS
1.0000 mg | ORAL_TABLET | Freq: Every day | ORAL | Status: DC
  Administered 2017-07-04 – 2017-07-05 (×2): 1 mg via ORAL
  Filled 2017-07-04 (×3): qty 1

## 2017-07-04 MED ORDER — DULOXETINE HCL 60 MG PO CPEP
60.0000 mg | ORAL_CAPSULE | Freq: Every day | ORAL | Status: DC
  Administered 2017-07-05 – 2017-07-06 (×2): 60 mg via ORAL
  Filled 2017-07-04 (×3): qty 1

## 2017-07-04 MED ORDER — PANTOPRAZOLE SODIUM 40 MG PO TBEC
40.0000 mg | DELAYED_RELEASE_TABLET | Freq: Every day | ORAL | Status: DC
  Administered 2017-07-05 – 2017-07-06 (×2): 40 mg via ORAL
  Filled 2017-07-04 (×2): qty 1

## 2017-07-04 MED ORDER — NICOTINE 21 MG/24HR TD PT24
21.0000 mg | MEDICATED_PATCH | Freq: Every day | TRANSDERMAL | Status: DC
  Administered 2017-07-04 – 2017-07-06 (×3): 21 mg via TRANSDERMAL
  Filled 2017-07-04 (×3): qty 1

## 2017-07-04 MED ORDER — BUPROPION HCL ER (SR) 150 MG PO TB12
150.0000 mg | ORAL_TABLET | Freq: Two times a day (BID) | ORAL | Status: DC
Start: 2017-07-04 — End: 2017-07-06
  Administered 2017-07-04 – 2017-07-05 (×3): 150 mg via ORAL
  Filled 2017-07-04 (×6): qty 1

## 2017-07-04 MED ORDER — METOPROLOL TARTRATE 25 MG PO TABS
25.0000 mg | ORAL_TABLET | Freq: Two times a day (BID) | ORAL | Status: DC
  Administered 2017-07-04: 25 mg via ORAL
  Filled 2017-07-04 (×2): qty 1

## 2017-07-04 MED ORDER — LORATADINE 10 MG PO TABS
10.0000 mg | ORAL_TABLET | Freq: Every day | ORAL | Status: DC
  Administered 2017-07-05 – 2017-07-06 (×2): 10 mg via ORAL
  Filled 2017-07-04 (×2): qty 1

## 2017-07-04 MED ORDER — SUCRALFATE 1 G PO TABS
1.0000 g | ORAL_TABLET | Freq: Four times a day (QID) | ORAL | Status: DC
  Administered 2017-07-04 – 2017-07-06 (×6): 1 g via ORAL
  Filled 2017-07-04 (×6): qty 1

## 2017-07-04 MED ORDER — GUAIFENESIN ER 600 MG PO TB12
600.0000 mg | ORAL_TABLET | Freq: Two times a day (BID) | ORAL | Status: DC
  Administered 2017-07-04 – 2017-07-06 (×4): 600 mg via ORAL
  Filled 2017-07-04 (×5): qty 1

## 2017-07-04 MED ORDER — LORAZEPAM 2 MG/ML IJ SOLN
1.0000 mg | INTRAMUSCULAR | Status: AC
Start: 2017-07-04 — End: 2017-07-04
  Administered 2017-07-04: 1 mg via INTRAVENOUS

## 2017-07-04 MED ORDER — CLONAZEPAM 1 MG PO TABS
1.0000 mg | ORAL_TABLET | Freq: Three times a day (TID) | ORAL | Status: DC | PRN
  Administered 2017-07-04 – 2017-07-06 (×4): 1 mg via ORAL
  Filled 2017-07-04 (×4): qty 1

## 2017-07-04 MED ORDER — FAMOTIDINE IN NACL 20-0.9 MG/50ML-% IV SOLN
20.0000 mg | Freq: Two times a day (BID) | INTRAVENOUS | Status: DC
  Administered 2017-07-04 – 2017-07-05 (×3): 20 mg via INTRAVENOUS
  Filled 2017-07-04 (×4): qty 50

## 2017-07-04 MED ORDER — TRAMADOL HCL 50 MG PO TABS
50.0000 mg | ORAL_TABLET | Freq: Four times a day (QID) | ORAL | Status: DC | PRN
Start: 2017-07-04 — End: 2017-07-06

## 2017-07-04 MED ORDER — FLUTICASONE PROPIONATE 50 MCG/ACT NA SUSP
2.0000 | Freq: Every day | NASAL | Status: DC
  Administered 2017-07-05 – 2017-07-06 (×2): 2 via NASAL
  Filled 2017-07-04: qty 16

## 2017-07-04 MED ORDER — LORAZEPAM 2 MG/ML IJ SOLN
0.5000 mg | INTRAMUSCULAR | Status: DC | PRN
  Filled 2017-07-04: qty 1

## 2017-07-04 MED ORDER — DIPHENHYDRAMINE HCL 50 MG/ML IJ SOLN
25.0000 mg | Freq: Once | INTRAMUSCULAR | Status: AC
  Administered 2017-07-04: 25 mg via INTRAVENOUS
  Filled 2017-07-04: qty 1

## 2017-07-04 MED ORDER — ACETAMINOPHEN 650 MG RE SUPP: 650.0000 mg | Freq: Four times a day (QID) | RECTAL | Status: DC | PRN

## 2017-07-04 MED ORDER — LEVOFLOXACIN IN D5W 750 MG/150ML IV SOLN
750.0000 mg | Freq: Once | INTRAVENOUS | Status: AC
  Administered 2017-07-04: 750 mg via INTRAVENOUS
  Filled 2017-07-04: qty 150

## 2017-07-04 NOTE — ED Triage Notes (Addendum)
Pt comes into the ED via [facility] clinic where they sent her for increased HR and "jumpiness".  Patient takes Cymbalta and risperidone and they [facility] clinic thinks these symptoms may be related to medication use.  Patient appears to be altered per her mother.  Patient denies any new pain at this time.  Patient has even and unlabored respirations. Patient unable to sit still at this time.  Patient has a h/o prescription drug abuse in the past but states that she hasnt taken anything outside of her normal prescriptions.

## 2017-07-04 NOTE — Progress Notes (Signed)
Pharmacy Antibiotic Note  Becky Hubbard is a 47 y.o. female admitted on 07/04/2017 with pneumonia.  Pharmacy has been consulted for levaquin dosing.  Plan: levaquin 750mg  iv q24h   Height: 5\' 5"  (165.1 cm) Weight: 189 lb (85.7 kg) IBW/kg (Calculated) : 57  Temp (24hrs), Avg:98.4 F (36.9 C), Min:98.4 F (36.9 C), Max:98.4 F (36.9 C)  Recent Labs  Lab 07/04/17 1459  WBC 16.0*  CREATININE 0.62    Estimated Creatinine Clearance: 93 mL/min (by C-G formula based on SCr of 0.62 mg/dL).    Allergies  Allergen Reactions  . Doxycycline   . Lyrica [Pregabalin] Swelling    Ankles swell and turn red  . Neurontin [Gabapentin]   . Chantix [Varenicline] Rash  . Other Other (See Comments)    Cats - cough/sneezing  . Penicillin G Hives  . Penicillins Hives    Has patient had a PCN reaction causing immediate rash, facial/tongue/throat swelling, SOB or lightheadedness with hypotension: No Has patient had a PCN reaction causing severe rash involving mucus membranes or skin necrosis: No Has patient had a PCN reaction that required hospitalization No Has patient had a PCN reaction occurring within the last 10 years: No If all of the above answers are "NO", then may proceed with Cephalosporin use.   . Sulfa Antibiotics Hives    Antimicrobials this admission: Anti-infectives (From admission, onward)   Start     Dose/Rate Route Frequency Ordered Stop   07/05/17 1500  levofloxacin (LEVAQUIN) IVPB 750 mg     750 mg 100 mL/hr over 90 Minutes Intravenous Every 24 hours 07/04/17 1621     07/04/17 1615  levofloxacin (LEVAQUIN) IVPB 750 mg     750 mg 100 mL/hr over 90 Minutes Intravenous  Once 07/04/17 1610         Microbiology results: No results found for this or any previous visit (from the past 240 hour(s)).   Thank you for allowing pharmacy to be a part of this patient's care.  Donna Christen Itzel Mckibbin 07/04/2017 4:22 PM
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
## 2017-07-04 NOTE — ED Notes (Addendum)
Pt disconnected iv while in bed with side rails up. Pt climbed out of the end of stretcher with all monitoring equipment attached and was standing beside bed when sitter was coming into room with patient and to prepare for transport.  Admitting MD, Dr. Clyda Hurdle was contacted to inform him of pt behavior and to inform him about pt not receiving the full ordered dose of antibiotics. Fluids and antibiotic to be discarded at this time due to contamination after pt removal from IV site

## 2017-07-04 NOTE — ED Notes (Signed)
When entering pt room to obtain cultures pt was sitting on bed that had been turned sideways in the room and was unlocked by the pt. Pt had removed gown and turned it around backward and removed all monitors. Pt extremely fidgety and unable to remain still for any length of time. Pt seems confused and slightly disoriented at this time.

## 2017-07-04 NOTE — ED Notes (Signed)
Pt continuously removing monitors for person, unable to monitor pr at this time.

## 2017-07-04 NOTE — ED Provider Notes (Signed)
[facility] Emergency Department Provider Note   ____________________________________________    I have reviewed the triage vital signs and the nursing notes.   HISTORY  Chief Complaint Tachycardia     HPI Becky Hubbard is a 47 y.o. female with history of fibromyalgia, PTSD, OCD, gastroparesis, hallucinations who presents today with complaints of "pain all over ".  Patient is a poor historian, she is with her mother who feels the patient is somewhat "confused "and "jumpy ".  Apparently the patient went to the [facility] urgent care and they sent her to the emergency department for evaluation.  Patient has had difficulty sitting still for 3 months since starting risperidone.  No reports of fevers or chills.  No cough.  No shortness of breath.  No reports of dysuria.   Past Medical History:  Diagnosis Date  . ADD (attention deficit disorder)   . Anxiety   . Arthritis   . Auditory hallucinations   . Carpal tunnel syndrome on both sides   . Cervical spine fracture ([place]) 2008   Closed with screw C2-3 [facility] Dr. Delilah Shan  . Degenerative joint disease of spine   . Depression   . Family history of adverse reaction to anesthesia    mom- slow to awaken  . Fibromyalgia   . Foot fracture, left   . Gastroparesis   . Gastroparesis   . GERD (gastroesophageal reflux disease)   . Hearing loss   . Herniated cervical disc   . History of prescription drug abuse   . Neuromuscular disorder (HCC)    fibromyalgia/chronic fatique syndrome  . Neuropathy of foot   . OCD (obsessive compulsive disorder)   . 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 Plaque psoriasis   . PTSD (post-traumatic stress disorder)   . Ulcer     Patient Active Problem List   Diagnosis Date Noted  . Tobacco abuse 03/03/2016  . COPD exacerbation ([place]) 02/11/2016  .  Obsessive-compulsive disorder 01/17/2015  . Borderline diabetes 09/04/2014  . Depression, major, recurrent, moderate ([place]) 08/21/2014  . ADD (attention deficit disorder) 07/31/2014  . Dermatitis, eczematoid 07/31/2014  . Allergy to environmental factors 07/31/2014  . Disorder of nervous system 07/31/2014  . 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 Plaque psoriasis 07/31/2014  . Cicatrix 07/31/2014  . Avitaminosis D 07/31/2014  . Loss of feeling or sensation 06/04/2014  . Auditory hallucinations   . Herniated cervical disc   . PTSD (post-traumatic stress disorder)   . Nonspecific abnormal results of endocrine function study 04/19/2014  . Carpal tunnel syndrome 10/03/2012  . Acid indigestion 07/21/2012  . 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 Fibromyalgia syndrome 01/22/2012  . Fibrositis 01/22/2012  . Cervical osteoarthritis 03/10/2011    Past Surgical History:  Procedure Laterality Date  . CARPAL TUNNEL RELEASE Left 07/12/2015   Procedure: OPEN CARPAL TUNNEL RELEASE OF LEFT HAND;  Surgeon: Leanor Kail, MD;  Location: [facility];  Service: Orthopedics;  Laterality: Left;  . cervical radiofrequency neurotomy    . FRACTURE SURGERY    . MOUTH SURGERY     upper and lower dentures  . NASAL SEPTUM SURGERY    . occipital nerve blocks    . SPINE SURGERY     cervical    Prior to Admission medications   Medication Sig Start Date End Date Taking? Authorizing Provider  ADVAIR Randalyn Rhea  250-50 MCG/DOSE AEPB TAKE 1 PUFF INTO LUNGS TWICE A DAY 01/12/17   Johnson, Megan P, DO  aspirin 81 MG tablet Take 81 mg by mouth daily.    [provider]  buPROPion (WELLBUTRIN SR) 150 MG 12 hr tablet TAKE 1 TABLET EVERY DAY FOR 3 DAYS, THEN1 TAB TWICE A DAY PICK A DAY IN THE SECOND WEEK TO QUIT SMOKING 07/14/16   Park Liter P, DO  Cholecalciferol (VITAMIN D3) 1000 UNITS CAPS Take by mouth.    [provider]  ciprofloxacin (CIPRO) 500 MG tablet Take 1 tablet (500 mg total) by mouth 2 (two)  times daily. 03/03/16   Johnson, Megan P, DO  clonazePAM (KLONOPIN) 1 MG tablet TAKE ONE TABLET BY MOUTH TWICE DAILY AS NEEDED FOR ANXIETY AND A HALFTABLET AT BEDTIME AS NEEDED FOR ANXIETY 09/10/15   Johnson, Megan P, DO  Cyanocobalamin (RA VITAMIN B-12 TR) 1000 MCG TBCR Take by mouth.    [provider]  cyclobenzaprine (FLEXERIL) 10 MG tablet TAKE 1 TABLET BY MOUTH EVERY DAY AS NEEDED AND 2 TABLETS BY MOUTH EVERY EVENING AT BEDTIME. **MUST LAST 30 DAYS** 06/23/16   Johnson, Megan P, DO  DULoxetine (CYMBALTA) 60 MG capsule Take 1 capsule (60 mg total) by mouth daily. 07/04/15   Johnson, Megan P, DO  fluticasone (FLONASE) 50 MCG/ACT nasal spray Place 2 sprays into both nostrils daily. 10/04/15   Johnson, Megan P, DO  guaiFENesin (MUCINEX) 600 MG 12 hr tablet Take 1 tablet (600 mg total) by mouth 2 (two) times daily. 02/14/16   Henreitta Leber, MD  ipratropium-albuterol (DUONEB) 0.5-2.5 (3) MG/3ML SOLN Take 3 mLs by nebulization every 6 (six) hours as needed. 02/14/16   Henreitta Leber, MD  loratadine (CLARITIN) 10 MG tablet TAKE ONE TABLET EVERY DAY 05/12/16   Johnson, Megan P, DO  nicotine (NICODERM CQ - DOSED IN MG/24 HOURS) 21 mg/24hr patch Place 1 patch (21 mg total) onto the skin daily. 03/04/16   Johnson, Megan P, DO  nicotine polacrilex (EQL NICOTINE POLACRILEX) 4 MG lozenge Take 1 lozenge (4 mg total) by mouth as needed for smoking cessation. 03/03/16   Johnson, Megan P, DO  nitrofurantoin, macrocrystal-monohydrate, (MACROBID) 100 MG capsule Take 1 capsule (100 mg total) by mouth 2 (two) times daily. 03/09/16   Johnson, Megan P, DO  omega-3 acid ethyl esters (LOVAZA) 1 g capsule Take by mouth.    [provider]  pantoprazole (PROTONIX) 40 MG tablet TAKE ONE TABLET EVERY DAY 09/04/16   Johnson, Megan P, DO  predniSONE (DELTASONE) 10 MG tablet Label  & dispense according to the schedule below. 5 Pills PO for 1 day then, 4 Pills PO for 1 day, 3 Pills PO for 1 day, 2 Pills PO for 1 day, 1 Pill  PO for 1 days then STOP. 02/14/16   Sainani, Belia Heman, MD  PROAIR HFA 108 (90 Base) MCG/ACT inhaler TAKE 2 PUFFS INTO LUNGS EVERY 6 HOURS ASNEEDED FOR WHEEZING OR SHORTNESS OF BREATH 01/12/17   Johnson, Megan P, DO  risperiDONE (RISPERDAL) 1 MG tablet Take 1 mg by mouth at bedtime.  11/25/15   [provider]  SM MULTIPLE VITAMINS/IRON TABS Take by mouth. 12/25/05   [provider]  sucralfate (CARAFATE) 1 G tablet Take 1 g by mouth 4 (four) times daily.    [provider]  traMADol (ULTRAM) 50 MG tablet Take 1 tablet (50 mg total) by mouth every 12 (twelve) hours as needed. 02/28/16   Wynetta Emery,  Megan P, DO     Allergies Doxycycline; Lyrica [pregabalin]; Neurontin [gabapentin]; Chantix [varenicline]; Other; Penicillin g; Penicillins; and Sulfa antibiotics  Family History  Problem Relation Age of Onset  . Cancer Mother 30       breast cancer  . Migraines Mother   . Hypertension Mother   . Arthritis Mother   . Sleep apnea Mother   . Anxiety disorder Mother   . Depression Mother   . Diabetes Father   . Cancer Father 44       of spine , pancreas and liver  . Anxiety disorder Father   . Depression Father   . Diabetes Brother   . Hyperlipidemia Brother   . Sleep apnea Brother   . Cancer Maternal Grandfather        leukemia, prostate  . Cancer Paternal Grandfather        bone  . Cancer Maternal Aunt        breast  . Mental illness Maternal Grandmother        Dementia  . Stroke Paternal Grandmother   . Diabetes Brother   . Cancer Paternal Aunt        breast    Social History Social History   Tobacco Use  . Smoking status: Current Every Day Smoker    Packs/day: 1.00    Years: 20.00    Pack years: 20.00    Types: Cigarettes    Start date: 03/01/2011  . Smokeless tobacco: Never Used  Substance Use Topics  . Alcohol use: No  . Drug use: No    Review of Systems  Constitutional: No fever/chills Eyes: No visual changes.  ENT: No sore  throat. Cardiovascular: Denies chest pain. Respiratory: Denies shortness of breath. Gastrointestinal: No abdominal pain.  Genitourinary: Negative for dysuria. Musculoskeletal: Negative for back pain. Skin: Negative for rash. Neurological: Negative for headaches   ____________________________________________   PHYSICAL EXAM:  VITAL SIGNS: ED Triage Vitals  Enc Vitals Group     BP 07/04/17 1450 (!) 128/97     Pulse Rate 07/04/17 1450 (!) 119     Resp 07/04/17 1450 (!) 28     Temp 07/04/17 1450 98.4 F (36.9 C)     Temp Source 07/04/17 1450 Oral     SpO2 07/04/17 1450 96 %     Weight 07/04/17 1447 85.7 kg (189 lb)     Height 07/04/17 1447 1.651 m (5\' 5" )     Head Circumference --      Peak Flow --      Pain Score 07/04/17 1447 7     Pain Loc --      Pain Edu? --      Excl. in [place]? --     Constitutional: Alert and oriented.  Unable to sit still, frequent movements Eyes: Conjunctivae are normal.  Head: Atraumatic. Nose: No congestion/rhinnorhea. Mouth/Throat: Mucous membranes are moist.    Cardiovascular: Normal rate, regular rhythm. Grossly normal heart sounds.  Good peripheral circulation. Respiratory: Normal respiratory effort.  No retractions. Lungs CTAB. Gastrointestinal: Soft and nontender. No distention.  No CVA tenderness.  Musculoskeletal:  Warm and well perfused Neurologic:  Normal speech and language. No gross focal neurologic deficits are appreciated.  Skin:  Skin is warm, dry and intact. No rash noted. Psychiatric: Mood and affect are normal.  unusual behavior  ____________________________________________   LABS (all labs ordered are listed, but only abnormal results are displayed)  Labs Reviewed  CBC - Abnormal; Notable for the following components:  Result Value   WBC 16.0 (*)    All other components within normal limits  COMPREHENSIVE METABOLIC PANEL - Abnormal; Notable for the following components:   Glucose, Bld 161 (*)    Total Protein  8.5 (*)    All other components within normal limits  URINALYSIS, COMPLETE (UACMP) WITH MICROSCOPIC - Abnormal; Notable for the following components:   Color, Urine YELLOW (*)    APPearance CLEAR (*)    All other components within normal limits  URINE DRUG SCREEN, QUALITATIVE ([facility] ONLY) - Abnormal; Notable for the following components:   Tricyclic, Ur Screen POSITIVE (*)    Benzodiazepine, Ur Scrn POSITIVE (*)    All other components within normal limits  CULTURE, BLOOD (ROUTINE X 2)  CULTURE, BLOOD (ROUTINE X 2)  TROPONIN I  PREGNANCY, URINE  LACTIC ACID, PLASMA  LACTIC ACID, PLASMA  POC URINE PREG, ED   ____________________________________________  EKG  ED ECG REPORT I, Lavonia Drafts, the attending physician, personally viewed and interpreted this ECG.  Date: 07/04/2017  Rhythm: Sinus tachycardia QRS Axis: normal Intervals: normal ST/T Wave abnormalities: normal Narrative Interpretation: no evidence of acute ischemia  ____________________________________________  RADIOLOGY  X-ray consistent with pneumonia ____________________________________________   PROCEDURES  Procedure(s) performed: No  Procedures   Critical Care performed: No ____________________________________________   INITIAL IMPRESSION / ASSESSMENT AND PLAN / ED COURSE  Pertinent labs & imaging results that were available during my care of the patient were reviewed by me and considered in my medical decision making (see chart for details).  Patient presents with vague complaints of "pain all over, jumpiness "none of which appear to be new and are somewhat chronic.  She does have an elevated heart rate, she reports this is also chronic for her, review of records confirms that she does typically have a heart rate between 105 and 110.  Mother feels the patient is confused we will evaluate for possible infectious etiology causing encephalopathy.     X-ray consistent with pneumonia, white blood  cell count is significantly elevated, patient is significantly tachycardic.  Will treat with IV Levaquin given penicillin allergy   ____________________________________________   FINAL CLINICAL IMPRESSION(S) / ED DIAGNOSES  Final diagnoses:  Community acquired pneumonia of right lung, unspecified part of lung  Metabolic encephalopathy        Note:  This document was prepared using Dragon voice recognition software and may include unintentional dictation errors.    Lavonia Drafts, MD 07/04/17 [phone]

## 2017-07-04 NOTE — Progress Notes (Signed)
Pt unable to perform IS.  Needs ongoing encouragement to do deep breathing.

## 2017-07-04 NOTE — Progress Notes (Signed)
Pt has an order for cardiac monitoring. Pt is noted to be anxious and restless this time. Per report given by previous RN, pt could not keep the monitors on in the ED and pulled out IV's. This Probation officer also received a critical lactic acid result of 2.4 from the lab at 2006. Dr. Jerelyn Charles paged and notified of the above. Per Dr. Jerelyn Charles, okay to discontinue cardiac monitoring order. Pt has been started on IV fluids of NS at 168ml/hr. Will continue to monitor. Safety sitter at bedside.

## 2017-07-05 DIAGNOSIS — R410 Disorientation, unspecified: Secondary | ICD-10-CM

## 2017-07-05 LAB — CK: Total CK: 95 U/L (ref 38–234)

## 2017-07-05 LAB — CBC
HCT: 37.2 % (ref 35.0–47.0)
Hemoglobin: 12.9 g/dL (ref 12.0–16.0)
MCH: 29.6 pg (ref 26.0–34.0)
MCHC: 34.6 g/dL (ref 32.0–36.0)
MCV: 85.6 fL (ref 80.0–100.0)
Platelets: 218 10*3/uL (ref 150–440)
RBC: 4.34 MIL/uL (ref 3.80–5.20)
RDW: 14 % (ref 11.5–14.5)
WBC: 13.5 10*3/uL — ABNORMAL HIGH (ref 3.6–11.0)

## 2017-07-05 LAB — COMPREHENSIVE METABOLIC PANEL
ALT: 22 U/L (ref 14–54)
AST: 21 U/L (ref 15–41)
Albumin: 3.9 g/dL (ref 3.5–5.0)
Alkaline Phosphatase: 47 U/L (ref 38–126)
Anion gap: 8 (ref 5–15)
BUN: 9 mg/dL (ref 6–20)
CO2: 23 mmol/L (ref 22–32)
Calcium: 9.1 mg/dL (ref 8.9–10.3)
Chloride: 106 mmol/L (ref 101–111)
Creatinine, Ser: 0.58 mg/dL (ref 0.44–1.00)
GFR calc Af Amer: >60 mL/min (ref >60)
GFR calc non Af Amer: >60 mL/min (ref >60)
Glucose, Bld: 125 mg/dL — ABNORMAL HIGH (ref 65–99)
Potassium: 3.7 mmol/L (ref 3.5–5.1)
Sodium: 137 mmol/L (ref 135–145)
Total Bilirubin: 0.6 mg/dL (ref 0.3–1.2)
Total Protein: 7.1 g/dL (ref 6.5–8.1)

## 2017-07-05 MED ORDER — METOPROLOL TARTRATE 25 MG PO TABS: 12.5000 mg | ORAL_TABLET | Freq: Two times a day (BID) | ORAL | Status: DC

## 2017-07-05 NOTE — Consult Note (Signed)
[facility] Consult   Reason for Consult: Consult for 47 year old woman with a long complicated psychiatric history who came to the hospital with delirium. Referring Physician: Doy Hutching Patient Identification: Becky Hubbard MRN:  [id number] Principal Diagnosis: Acute delirium Diagnosis:   Patient Active Problem List   Diagnosis Date Noted  . Acute delirium [R41.0] 07/05/2017  . CAP (community acquired pneumonia) [J18.9] 07/04/2017  . Tachycardia [R00.0] 07/04/2017  . Acute encephalopathy [G93.40] 07/04/2017  . Tobacco abuse [Z72.0] 03/03/2016  . COPD exacerbation ([place]) [J44.1] 02/11/2016  . Obsessive-compulsive disorder [F42.9] 01/17/2015  . Borderline diabetes [R73.03] 09/04/2014  . Depression, major, recurrent, moderate ([place]) [F33.1] 08/21/2014  . ADD (attention deficit disorder) [F98.8] 07/31/2014  . Dermatitis, eczematoid [L30.9] 07/31/2014  . Allergy to environmental factors [Z91.09] 07/31/2014  . Disorder of nervous system [G98.8] 07/31/2014  . 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 Plaque psoriasis [L40.0] 07/31/2014  . Cicatrix [L90.5] 07/31/2014  . Avitaminosis D [E55.9] 07/31/2014  . Loss of feeling or sensation [R20.0] 06/04/2014  . Auditory hallucinations [R44.0]   . Herniated cervical disc [M50.20]   . PTSD (post-traumatic stress disorder) [F43.10]   . Nonspecific abnormal results of endocrine function study [R94.7] 04/19/2014  . Carpal tunnel syndrome [G56.00] 10/03/2012  . Acid indigestion [K30] 07/21/2012  . 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 Fibromyalgia syndrome [M79.7] 01/22/2012  . Fibrositis [M79.7] 01/22/2012  . Cervical osteoarthritis [M47.812] 03/10/2011    Total Time spent with patient: 1 hour  Subjective:   Becky Hubbard is a 47 y.o. female patient admitted with "I started shaking real bad" patient interviewed chart reviewed.  I would like to say that I saw this patient and.  HPI: Did my evaluation prior to the 24-hour limit but M  only now getting a chance to put a note in.  Patient came into the hospital confused and agitated.  She was still agitated last night and was described as pulling out her IV and other lines and trying to climb out of bed and requiring a lot of redirection.  Patient says she remembers this only vaguely.  I got some history from the patient and her mother.  Patient says that she started shaking really badly with a lot of posturing and spastic movements in her arms.  This happened about 2 days ago.  She could not sleep for a couple of days and was feeling confused.  Today the patient says she is feeling much better.  She is nearly back to her baseline.  She denies any hallucinations.  Denies any suicidal or homicidal thought.  She is no longer having any of the shaking or strange movements of her arms.  I tried to get a clear history of how her medications had changed recently but it was hard to be certain of it.  Patient gave a history which her mother contradicted on some points and the patient does seem to have some confusion about her memory.  As far as I can tell she had been on risk Toradol in the past and then quit it at some point and started back on it about 10 or 12 days ago at the same time starting back on Wellbutrin a medicine she had been on in the past.  She says that she has been on her Cymbalta steadily.  She also takes Valium 10 mg 3 times a day which she has been taking chronically.  There seems to be some uncertainty about whether she might of recently had a discontinuation  or run out of that.  Social history: Patient lives with her mother.  Does not work outside the home.  She is disabled.  Focuses a lot on her medical problems.  Medical history: Multiple medical problems.  Acutely she was found to have a pneumonia on this hospitalization.  She has long-term problems with fibromyalgia gastric reflux symptoms all sorts of musculoskeletal and neurologic complaints.  Substance abuse history:  Patient denies that she has been drinking or abusing any drugs  Past Psychiatric History: Patient has a long mental health history.  She tells me that she has been diagnosed with ADD, OCD, PTSD and bipolar disorder.  She has recently started seeing Dr. Collie Siad who is now prescribing her medicine for her.  Patient has had past psychiatric admissions but it sounds like it has been years since that happened.  She has had past suicide attempts but that has been sometime in the past as well.  Multiple medications have been tried.  Evidently the risk Toradol and Cymbalta and some kind of benzodiazepine of been a long-standing chronic thing.  Risk to Self: Is patient at risk for suicide?: No Risk to Others:   Prior Inpatient Therapy:   Prior Outpatient Therapy:    Past Medical History:  Past Medical History:  Diagnosis Date  . ADD (attention deficit disorder)   . Anxiety   . Arthritis   . Auditory hallucinations   . Carpal tunnel syndrome on both sides   . Cervical spine fracture ([place]) 2008   Closed with screw C2-3 [facility] Dr. Delilah Shan  . Degenerative joint disease of spine   . Depression   . Family history of adverse reaction to anesthesia    mom- slow to awaken  . Fibromyalgia   . Foot fracture, left   . Gastroparesis   . Gastroparesis   . GERD (gastroesophageal reflux disease)   . Hearing loss   . Herniated cervical disc   . History of prescription drug abuse   . Neuromuscular disorder (H[facility])    fibromyalgia/chronic fatique syndrome  . Neuropathy of foot   . OCD (obsessive compulsive disorder)   . 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 Plaque psoriasis   . PTSD (post-traumatic stress disorder)   . Ulcer     Past Surgical History:  Procedure Laterality Date  . CARPAL TUNNEL RELEASE Left 07/12/2015   Procedure: OPEN CARPAL TUNNEL RELEASE OF LEFT HAND;  Surgeon: Leanor Kail,  MD;  Location: [facility];  Service: Orthopedics;  Laterality: Left;  . cervical radiofrequency neurotomy    . FRACTURE SURGERY    . MOUTH SURGERY     upper and lower dentures  . NASAL SEPTUM SURGERY    . occipital nerve blocks    . SPINE SURGERY     cervical   Family History:  Family History  Problem Relation Age of Onset  . Cancer Mother 18       breast cancer  . Migraines Mother   . Hypertension Mother   . Arthritis Mother   . Sleep apnea Mother   . Anxiety disorder Mother   . Depression Mother   . Diabetes Father   . Cancer Father 57       of spine , pancreas and liver  . Anxiety disorder Father   . Depression Father   . Diabetes Brother   . Hyperlipidemia  Brother   . Sleep apnea Brother   . Cancer Maternal Grandfather        leukemia, prostate  . Cancer Paternal Grandfather        bone  . Cancer Maternal Aunt        breast  . Mental illness Maternal Grandmother        Dementia  . Stroke Paternal Grandmother   . Diabetes Brother   . Cancer Paternal Aunt        breast   Family Psychiatric  History: She denies knowing of any family history Social History:  Social History   Substance and Sexual Activity  Alcohol Use No     Social History   Substance and Sexual Activity  Drug Use No    Social History   Socioeconomic History  . Marital status: Unknown    Spouse name: Not on file  . Number of children: Not on file  . Years of education: Not on file  . Highest education level: Not on file  Occupational History  . Not on file  Social Needs  . Financial resource strain: Not on file  . Food insecurity:    Worry: Not on file    Inability: Not on file  . Transportation needs:    Medical: Not on file    Non-medical: Not on file  Tobacco Use  . Smoking status: Current Every Day Smoker    Packs/day: 1.00    Years: 20.00    Pack years: 20.00    Types: Cigarettes    Start date: 03/01/2011  . Smokeless tobacco: Never Used  Substance and Sexual  Activity  . Alcohol use: No  . Drug use: No  . Sexual activity: Never  Lifestyle  . Physical activity:    Days per week: Not on file    Minutes per session: Not on file  . Stress: Not on file  Relationships  . Social connections:    Talks on phone: Not on file    Gets together: Not on file    Attends religious service: Not on file    Active member of club or organization: Not on file    Attends meetings of clubs or organizations: Not on file    Relationship status: Not on file  Other Topics Concern  . Not on file  Social History Narrative   Depression- history of suicide attempt and hospitalization about 1998 after divorce   H/O hospitalization at [facility] for narcotics detox/withdrawal approx. 2000   Additional Social History:    Allergies:   Allergies  Allergen Reactions  . Doxycycline   . Lyrica [Pregabalin] Swelling    Ankles swell and turn red  . Neurontin [Gabapentin]   . Chantix [Varenicline] Rash  . Other Other (See Comments)    Cats - cough/sneezing  . Penicillin G Hives  . Penicillins Hives    Has patient had a PCN reaction causing immediate rash, facial/tongue/throat swelling, SOB or lightheadedness with hypotension: No Has patient had a PCN reaction causing severe rash involving mucus membranes or skin necrosis: No Has patient had a PCN reaction that required hospitalization No Has patient had a PCN reaction occurring within the last 10 years: No If all of the above answers are "NO", then may proceed with Cephalosporin use.   . Sulfa Antibiotics Hives, Rash and Other (See Comments)    Labs:  Results for orders placed or performed during the hospital encounter of 07/04/17 (from the past 48 hour(s))  CBC  Status: Abnormal   Collection Time: 07/04/17  2:59 PM  Result Value Ref Range   WBC 16.0 (H) 3.6 - 11.0 K/uL   RBC 4.40 3.80 - 5.20 MIL/uL   Hemoglobin 12.8 12.0 - 16.0 g/dL   HCT 37.7 35.0 - 47.0 %   MCV 85.9 80.0 - 100.0 fL   MCH  29.0 26.0 - 34.0 pg   MCHC 33.8 32.0 - 36.0 g/dL   RDW 13.9 11.5 - 14.5 %   Platelets 235 150 - 440 K/uL    Comment: Performed at [facility], [address] [place], [place] [address]  Comprehensive metabolic panel     Status: Abnormal   Collection Time: 07/04/17  2:59 PM  Result Value Ref Range   Sodium 139 135 - 145 mmol/L   Potassium 3.9 3.5 - 5.1 mmol/L   Chloride 104 101 - 111 mmol/L   CO2 23 22 - 32 mmol/L   Glucose, Bld 161 (H) 65 - 99 mg/dL   BUN 13 6 - 20 mg/dL   Creatinine, Ser 0.62 0.44 - 1.00 mg/dL   Calcium 9.7 8.9 - 10.3 mg/dL   Total Protein 8.5 (H) 6.5 - 8.1 g/dL   Albumin 4.5 3.5 - 5.0 g/dL   AST 40 15 - 41 U/L   ALT 30 14 - 54 U/L   Alkaline Phosphatase 53 38 - 126 U/L   Total Bilirubin 0.4 0.3 - 1.2 mg/dL   GFR calc non Af Amer >60 >60 mL/min   GFR calc Af Amer >60 >60 mL/min    Comment: (NOTE) The eGFR has been calculated using the CKD EPI equation. This calculation has not been validated in all clinical situations. eGFR's persistently <60 mL/min signify possible Chronic Kidney Disease.    Anion gap 12 5 - 15    Comment: Performed at [facility], [address] [place], [place] [address]  Troponin I     Status: None   Collection Time: 07/04/17  2:59 PM  Result Value Ref Range   Troponin I <0.03 <0.03 ng/mL    Comment: Performed at [facility], [address] [place], [place] [address]  Urinalysis, Complete w Microscopic     Status: Abnormal   Collection Time: 07/04/17  2:59 PM  Result Value Ref Range   Color, Urine YELLOW (A) YELLOW   APPearance CLEAR (A) CLEAR   Specific Gravity, Urine 1.008 1.005 - 1.030   pH 6.0 5.0 - 8.0   Glucose, UA NEGATIVE NEGATIVE mg/dL   Hgb urine dipstick NEGATIVE NEGATIVE   Bilirubin Urine NEGATIVE NEGATIVE   Ketones, ur NEGATIVE NEGATIVE mg/dL   Protein, ur NEGATIVE NEGATIVE mg/dL   Nitrite NEGATIVE NEGATIVE   Leukocytes, UA NEGATIVE NEGATIVE   RBC / HPF 0-5 0 - 5 RBC/hpf   WBC,  UA NONE SEEN 0 - 5 WBC/hpf   Bacteria, UA NONE SEEN NONE SEEN   Squamous Epithelial / LPF NONE SEEN 0 - 5    Comment: Performed at [facility], [address], [place], [place] [address]  Urine Drug Screen, Qualitative ([facility] only)     Status: Abnormal   Collection Time: 07/04/17  2:59 PM  Result Value Ref Range   Tricyclic, Ur Screen POSITIVE (A) NONE DETECTED   Amphetamines, Ur Screen NONE DETECTED NONE DETECTED   MDMA (Ecstasy)Ur Screen NONE DETECTED NONE DETECTED   Cocaine Metabolite,Ur [place] NONE DETECTED NONE DETECTED   Opiate, Ur Screen NONE DETECTED NONE DETECTED   Phencyclidine (PCP) Ur  S NONE DETECTED NONE DETECTED   Cannabinoid 50 Ng, Ur [place] NONE DETECTED NONE DETECTED   Barbiturates, Ur Screen NONE DETECTED NONE DETECTED   Benzodiazepine, Ur Scrn POSITIVE (A) NONE DETECTED   Methadone Scn, Ur NONE DETECTED NONE DETECTED    Comment: (NOTE) Tricyclics + metabolites, urine    Cutoff 1000 ng/mL Amphetamines + metabolites, urine  Cutoff 1000 ng/mL MDMA (Ecstasy), urine              Cutoff 500 ng/mL Cocaine Metabolite, urine          Cutoff 300 ng/mL Opiate + metabolites, urine        Cutoff 300 ng/mL Phencyclidine (PCP), urine         Cutoff 25 ng/mL Cannabinoid, urine                 Cutoff 50 ng/mL Barbiturates + metabolites, urine  Cutoff 200 ng/mL Benzodiazepine, urine              Cutoff 200 ng/mL Methadone, urine                   Cutoff 300 ng/mL The urine drug screen provides only a preliminary, unconfirmed analytical test result and should not be used for non-medical purposes. Clinical consideration and professional judgment should be applied to any positive drug screen result due to possible interfering substances. A more specific alternate chemical method must be used in order to obtain a confirmed analytical result. Gas chromatography / mass spectrometry (GC/MS) is the preferred confirmat ory method. Performed at [facility], [address] [place], [place] [address]   Pregnancy, urine     Status: None   Collection Time: 07/04/17  2:59 PM  Result Value Ref Range   Preg Test, Ur NEGATIVE NEGATIVE    Comment: Performed at [facility], [address] [place], [place] [address]  TSH     Status: None   Collection Time: 07/04/17  2:59 PM  Result Value Ref Range   TSH 2.409 0.350 - 4.500 uIU/mL    Comment: Performed by a 3rd Generation assay with a functional sensitivity of <=0.01 uIU/mL. Performed at [facility], [address] [place], [place] [address]   Blood culture (routine x 2)     Status: None (Preliminary result)   Collection Time: 07/04/17  4:35 PM  Result Value Ref Range   Specimen Description BLOOD RIGHT ANTECUBITAL    Special Requests      BOTTLES DRAWN AEROBIC AND ANAEROBIC Blood Culture results may not be optimal due to an excessive volume of blood received in culture bottles   Culture      NO GROWTH < 24 HOURS Performed at [facility], [address], [place], [place] [address]    Report Status PENDING   Blood culture (routine x 2)     Status: None (Preliminary result)   Collection Time: 07/04/17  4:35 PM  Result Value Ref Range   Specimen Description BLOOD LEFT ANTECUBITAL    Special Requests      BOTTLES DRAWN AEROBIC AND ANAEROBIC Blood Culture results may not be optimal due to an inadequate volume of blood received in culture bottles   Culture      NO GROWTH < 24 HOURS Performed at [facility], [address] [place], [place] [address]    Report Status PENDING   Lactic acid, plasma     Status: Abnormal   Collection Time: 07/04/17  4:35 PM  Result Value Ref Range   Lactic Acid, Venous 2.2 (HH) 0.5 - 1.9 mmol/L    Comment: CRITICAL RESULT CALLED TO, READ BACK BY AND VERIFIED WITH LORI LEMONS ON 07/04/17 AT 1713 [facility] Performed at [facility], [address] [place], [place] [address]   Lactic acid, plasma     Status: Abnormal   Collection  Time: 07/04/17  7:25 PM  Result Value Ref Range   Lactic Acid, Venous 2.4 (HH) 0.5 - 1.9 mmol/L    Comment: CRITICAL RESULT CALLED TO, READ BACK BY AND VERIFIED WITH ANESSA MACROHON AT 2006 ON 07/04/17 RWW Performed at [facility], [address] [place], [place] [address]   Comprehensive metabolic panel     Status: Abnormal   Collection Time: 07/05/17  4:32 AM  Result Value Ref Range   Sodium 137 135 - 145 mmol/L   Potassium 3.7 3.5 - 5.1 mmol/L   Chloride 106 101 - 111 mmol/L   CO2 23 22 - 32 mmol/L   Glucose, Bld 125 (H) 65 - 99 mg/dL   BUN 9 6 - 20 mg/dL   Creatinine, Ser 0.58 0.44 - 1.00 mg/dL   Calcium 9.1 8.9 - 10.3 mg/dL   Total Protein 7.1 6.5 - 8.1 g/dL   Albumin 3.9 3.5 - 5.0 g/dL   AST 21 15 - 41 U/L   ALT 22 14 - 54 U/L   Alkaline Phosphatase 47 38 - 126 U/L   Total Bilirubin 0.6 0.3 - 1.2 mg/dL   GFR calc non Af Amer >60 >60 mL/min   GFR calc Af Amer >60 >60 mL/min    Comment: (NOTE) The eGFR has been calculated using the CKD EPI equation. This calculation has not been validated in all clinical situations. eGFR's persistently <60 mL/min signify possible Chronic Kidney Disease.    Anion gap 8 5 - 15    Comment: Performed at [facility], [address] [place], [place] [address]  CBC     Status: Abnormal   Collection Time: 07/05/17  4:32 AM  Result Value Ref Range   WBC 13.5 (H) 3.6 - 11.0 K/uL   RBC 4.34 3.80 - 5.20 MIL/uL   Hemoglobin 12.9 12.0 - 16.0 g/dL   HCT 37.2 35.0 - 47.0 %   MCV 85.6 80.0 - 100.0 fL   MCH 29.6 26.0 - 34.0 pg   MCHC 34.6 32.0 - 36.0 g/dL   RDW 14.0 11.5 - 14.5 %   Platelets 218 150 - 440 K/uL    Comment: Performed at [facility], [address] [place], [place] [address]  CK     Status: None   Collection Time: 07/05/17  4:32 AM  Result Value Ref Range   Total CK 95 38 - 234 U/L    Comment: Performed at [facility], [address] [place], [place] [address]    Current  Facility-Administered Medications  Medication Dose Route Frequency Provider Last Rate Last Dose  . 0.9 %  sodium chloride infusion   Intravenous Continuous Loletha Grayer, MD 50 mL/hr at 07/05/17 0932    . acetaminophen (TYLENOL) tablet 650 mg  650 mg Oral Q6H PRN Idelle Crouch, MD   650 mg at 07/05/17 0220   Or  . acetaminophen (TYLENOL) suppository 650 mg  650 mg Rectal Q6H PRN Idelle Crouch, MD      . albuterol (PROVENTIL) (2.5 MG/3ML) 0.083% nebulizer solution 3 mL  3 mL Inhalation Q4H PRN Idelle Crouch,  MD      . aspirin EC tablet 81 mg  81 mg Oral Daily Idelle Crouch, MD   81 mg at 07/05/17 1031  . bisacodyl (DULCOLAX) suppository 10 mg  10 mg Rectal Daily PRN Idelle Crouch, MD      . buPROPion [facility] SR) 12 hr tablet 150 mg  150 mg Oral BID Idelle Crouch, MD   150 mg at 07/05/17 1032  . chlorpheniramine-HYDROcodone (TUSSIONEX) 10-8 MG/5ML suspension 5 mL  5 mL Oral Q12H PRN Idelle Crouch, MD      . clonazePAM [place]) tablet 1 mg  1 mg Oral TID PRN Idelle Crouch, MD   1 mg at 07/05/17 1838  . cyclobenzaprine (FLEXERIL) tablet 10 mg  10 mg Oral TID PRN Idelle Crouch, MD   10 mg at 07/05/17 0220  . docusate sodium (COLACE) capsule 100 mg  100 mg Oral BID Idelle Crouch, MD   100 mg at 07/05/17 1031  . DULoxetine (CYMBALTA) DR capsule 60 mg  60 mg Oral Daily Idelle Crouch, MD   60 mg at 07/05/17 1031  . famotidine (PEPCID) IVPB 20 mg premix  20 mg Intravenous Q12H Idelle Crouch, MD   Stopped at 07/05/17 1128  . fluticasone (FLONASE) 50 MCG/ACT nasal spray 2 spray  2 spray Each Nare Daily Idelle Crouch, MD   2 spray at 07/05/17 1033  . guaiFENesin (MUCINEX) 12 hr tablet 600 mg  600 mg Oral BID Idelle Crouch, MD   600 mg at 07/05/17 1030  . heparin injection 5,000 Units  5,000 Units Subcutaneous Q8H Idelle Crouch, MD   5,000 Units at 07/05/17 1514  . levofloxacin (LEVAQUIN) IVPB 750 mg  750 mg Intravenous Q24H Idelle Crouch,  MD   Stopped at 07/05/17 1907  . loratadine (CLARITIN) tablet 10 mg  10 mg Oral Daily Idelle Crouch, MD   10 mg at 07/05/17 1031  . LORazepam (ATIVAN) injection 0.5 mg  0.5 mg Intravenous Q4H PRN Idelle Crouch, MD      . mometasone-formoterol (DULERA) 200-5 MCG/ACT inhaler 2 puff  2 puff Inhalation BID Idelle Crouch, MD   2 puff at 07/05/17 [phone]  . nicotine (NICODERM CQ - dosed in mg/24 hours) patch 21 mg  21 mg Transdermal Daily Idelle Crouch, MD   21 mg at 07/05/17 1033  . ondansetron (ZOFRAN) tablet 4 mg  4 mg Oral Q6H PRN Idelle Crouch, MD       Or  . ondansetron (ZOFRAN) injection 4 mg  4 mg Intravenous Q6H PRN Idelle Crouch, MD      . pantoprazole (PROTONIX) EC tablet 40 mg  40 mg Oral Daily Idelle Crouch, MD   40 mg at 07/05/17 1031  . risperiDONE (RISPERDAL) tablet 1 mg  1 mg Oral QHS Idelle Crouch, MD   1 mg at 07/04/17 2105  . sucralfate (CARAFATE) tablet 1 g  1 g Oral QID Idelle Crouch, MD   1 g at 07/05/17 1741  . traMADol (ULTRAM) tablet 50 mg  50 mg Oral Q6H PRN Idelle Crouch, MD        Musculoskeletal: Strength & Muscle Tone: within normal limits Gait & Station: normal Patient leans: N/A  Psychiatric Specialty Exam: Physical Exam  Nursing note and vitals reviewed. Constitutional: She appears well-developed and well-nourished.  HENT:  Head: Normocephalic and atraumatic.  Eyes: Pupils are equal, round, and reactive to  light. Conjunctivae are normal.  Neck: Normal range of motion.  Cardiovascular: Regular rhythm and normal heart sounds.  Respiratory: Effort normal. No respiratory distress.  GI: Soft.  Musculoskeletal: Normal range of motion.  Neurological: She is alert.  Skin: Skin is warm and dry.  Psychiatric: She has a normal mood and affect. Judgment normal. Her speech is delayed. She is slowed. Thought content is not paranoid. Cognition and memory are impaired. She expresses no homicidal and no suicidal ideation.    Review of  Systems  Constitutional: Negative.   HENT: Negative.   Eyes: Negative.   Respiratory: Negative.   Cardiovascular: Negative.   Gastrointestinal: Negative.   Musculoskeletal: Negative.   Skin: Negative.   Neurological: Negative.   Psychiatric/Behavioral: Positive for memory loss. Negative for depression, hallucinations, substance abuse and suicidal ideas. The patient is nervous/anxious. The patient does not have insomnia.     Blood pressure (!) 100/55, pulse 90, temperature 98.4 F (36.9 C), temperature source Oral, resp. rate 18, height '5\' 5"'$  (1.651 m), weight 88 kg (194 lb 0.1 oz), SpO2 98 %.Body mass index is 32.28 kg/m.  General Appearance: Casual  Eye Contact:  Fair  Speech:  Clear and Coherent  Volume:  Decreased  Mood:  Euthymic  Affect:  Constricted  Thought Process:  Goal Directed  Orientation:  Full (Time, Place, and Person)  Thought Content:  Tangential  Suicidal Thoughts:  No  Homicidal Thoughts:  No  Memory:  Immediate;   Fair Recent;   Poor Remote;   Fair  Judgement:  Fair  Insight:  Shallow  Psychomotor Activity:  Normal  Concentration:  Concentration: Fair  Recall:  Poor  Fund of Knowledge:  Fair  Language:  Fair  Akathisia:  No  Handed:  Right  AIMS (if indicated):     Assets:  Desire for Improvement Housing Resilience Social Support  ADL's:  Intact  Cognition:  Impaired,  Mild  Sleep:        Treatment Plan Summary: Plan This is a 47 year old woman with a complicated medical and psychiatric condition who came into the hospital agitated.  I think the exact cause for her agitation and presumed delirium is still unclear but there are several things that could have contributed.  It is possible that the pneumonia itself was one factor.  Patient takes pretty high dose diazepam.  She is very vague about her medicines and is possible that she may have abruptly cut it down or cut it off or taken too much of it either 1 of which could have caused her kind of  acute symptoms.  When I first heard about this syndrome last night I was concerned about serotonin syndrome and that is still I suppose a possibility from the symptoms although the patient's only serotonergic medicine is Cymbalta and that apparently has been chronic without any changes.  In any case right now she seems to be coming back to her baseline.  I am not going to change anything about her medicine.  I did some counseling with her about the potential dangers of long-term chronic benzodiazepine abuse.  I will continue to follow up while she is in the hospital.  Fortunately she does have outpatient treatment in place.  Disposition: No evidence of imminent risk to self or others at present.   Patient does not meet criteria for psychiatric inpatient admission. Supportive therapy provided about ongoing stressors. Discussed crisis plan, support from social network, calling 911, coming to the Emergency Department, and calling Suicide Hotline.  Alethia [place], MD 07/05/2017 7:20 PM

## 2017-07-05 NOTE — Progress Notes (Signed)
Patient ID: Becky Hubbard, female   DOB: 1970/12/17, 47 y.o.   MRN: [id number]  [facility] PROGRESS NOTE  Becky Hubbard NLG:[id number] DOB: February 06, 1971 DOA: 07/04/2017 PCP: Park Liter P, DO  HPI/Subjective: Patient feeling better today.  Came in she was very shaky and spastic.  She stated she could not move her arms and legs very well.  She does have a history of restless leg syndrome, PTSD, OCD and ADD.  She has history of anxiety depression and chronic back and neck pain.  She follows with Dr. Kasandra Knudsen psychiatrist as outpatient.  Objective: Vitals:   07/05/17 0738 07/05/17 1036  BP: 99/72 (!) 99/55  Pulse: (!) 102 97  Resp: 18   Temp: 99.1 F (37.3 C)   SpO2: 99% 100%    Filed Weights   07/04/17 1447 07/05/17 0500  Weight: 85.7 kg (189 lb) 88 kg (194 lb 0.1 oz)    ROS: Review of Systems  Constitutional: Positive for malaise/fatigue. Negative for chills and fever.  Eyes: Negative for blurred vision.  Respiratory: Positive for cough and shortness of breath.   Cardiovascular: Negative for chest pain.  Gastrointestinal: Negative for abdominal pain, constipation, diarrhea, nausea and vomiting.  Genitourinary: Negative for dysuria.  Musculoskeletal: Negative for joint pain.  Neurological: Positive for weakness. Negative for dizziness and headaches.   Exam: Physical Exam  HENT:  Nose: No mucosal edema.  Mouth/Throat: No oropharyngeal exudate or posterior oropharyngeal edema.  Eyes: Pupils are equal, round, and reactive to light. Conjunctivae, EOM and lids are normal.  Neck: No JVD present. Carotid bruit is not present. No edema present. No thyroid mass and no thyromegaly present.  Cardiovascular: S1 normal and S2 normal. Tachycardia present. Exam reveals no gallop.  No murmur heard. Pulses:      Dorsalis pedis pulses are 2+ on the right side, and 2+ on the left side.  Respiratory: No respiratory distress. She has decreased breath sounds in the right lower field and the left  lower field. She has no wheezes. She has no rhonchi. She has no rales.  GI: Soft. Bowel sounds are normal. There is no tenderness.  Musculoskeletal:       Right ankle: She exhibits no swelling.       Left ankle: She exhibits no swelling.  Lymphadenopathy:    She has no cervical adenopathy.  Neurological: She is alert. No cranial nerve deficit.  Skin: Skin is warm. No rash noted. Nails show no clubbing.  Psychiatric: She has a normal mood and affect.      Data Reviewed: Basic Metabolic Panel: Recent Labs  Lab 07/04/17 1459 07/05/17 0432  NA 139 137  K 3.9 3.7  CL 104 106  CO2 23 23  GLUCOSE 161* 125*  BUN 13 9  CREATININE 0.62 0.58  CALCIUM 9.7 9.1   Liver Function Tests: Recent Labs  Lab 07/04/17 1459 07/05/17 0432  AST 40 21  ALT 30 22  ALKPHOS 53 47  BILITOT 0.4 0.6  PROT 8.5* 7.1  ALBUMIN 4.5 3.9   CBC: Recent Labs  Lab 07/04/17 1459 07/05/17 0432  WBC 16.0* 13.5*  HGB 12.8 12.9  HCT 37.7 37.2  MCV 85.9 85.6  PLT 235 218   Cardiac Enzymes: Recent Labs  Lab 07/04/17 1459 07/05/17 0432  CKTOTAL  --  95  TROPONINI <0.03  --      Recent Results (from the past 240 hour(s))  Blood culture (routine x 2)     Status: None (Preliminary result)  Collection Time: 07/04/17  4:35 PM  Result Value Ref Range Status   Specimen Description BLOOD RIGHT ANTECUBITAL  Final   Special Requests   Final    BOTTLES DRAWN AEROBIC AND ANAEROBIC Blood Culture results may not be optimal due to an excessive volume of blood received in culture bottles   Culture   Final    NO GROWTH < 24 HOURS Performed at [facility], [address] [place], [place] [address]    Report Status PENDING  Incomplete  Blood culture (routine x 2)     Status: None (Preliminary result)   Collection Time: 07/04/17  4:35 PM  Result Value Ref Range Status   Specimen Description BLOOD LEFT ANTECUBITAL  Final   Special Requests   Final    BOTTLES DRAWN AEROBIC AND ANAEROBIC Blood  Culture results may not be optimal due to an inadequate volume of blood received in culture bottles   Culture   Final    NO GROWTH < 24 HOURS Performed at [facility], [address] [place], [place] [address]    Report Status PENDING  Incomplete     Studies: Dg Chest 2 View  Result Date: 07/04/2017 CLINICAL DATA:  Pt comes into the ED via [facility] clinic where they sent her for increased HR and "jumpiness". Patient takes Cymbalta and risperidone and they [facility] clinic thinks these symptoms may be related to medication use. Patient appears to be altered per her mother. Patient denies any new pain at this time. Patient has even and unlabored respirations. Patient unable to sit still at this time. Patient has a h/o prescription drug abuse in the past but states that she hasnt taken anything outside of her normal prescriptions. Current smoker 1 ppd. EXAM: CHEST - 2 VIEW COMPARISON:  02/11/2016 and 02/06/2016. FINDINGS: Cardiac silhouette is normal in size and configuration. Normal mediastinal and hilar contours. Patchy peribronchovascular opacities are noted on the right in a perihilar distribution, not present on the prior studies. Remainder of the lungs is clear. No pleural effusion or pneumothorax. Skeletal structures are unremarkable. IMPRESSION: 1. Patchy right perihilar airspace opacities. Findings are consistent with pneumonia in the proper clinical setting. No evidence of pulmonary edema. Electronically Signed   By: Lajean Manes M.D.   On: 07/04/2017 16:01    Scheduled Meds: . aspirin EC  81 mg Oral Daily  . buPROPion  150 mg Oral BID  . docusate sodium  100 mg Oral BID  . DULoxetine  60 mg Oral Daily  . fluticasone  2 spray Each Nare Daily  . guaiFENesin  600 mg Oral BID  . heparin  5,000 Units Subcutaneous Q8H  . loratadine  10 mg Oral Daily  . metoprolol tartrate  25 mg Oral BID  . mometasone-formoterol  2 puff Inhalation BID  . nicotine  21 mg Transdermal Daily  . pantoprazole   40 mg Oral Daily  . risperiDONE  1 mg Oral QHS  . sucralfate  1 g Oral QID   Continuous Infusions: . sodium chloride 50 mL/hr at 07/05/17 0932  . famotidine (PEPCID) IV Stopped (07/05/17 1128)  . levofloxacin (LEVAQUIN) IV      Assessment/Plan:  1. Clinical sepsis present on admission with pneumonia right lung, tachycardia and leukocytosis.  Patient given high-dose IV Levaquin.  I will continue.  Continue IV fluids. 2. Relative hypotension stopped metoprolol which was started yesterday for tachycardia. 3. Anxiety, depression, posttraumatic stress disorder, OCD, ADD.  Continue psychiatric medications 4. Tobacco abuse on  nicotine patch.  Smoking cessation counseling done 3 minutes by me 5. GERD on tonics and Carafate  Code Status:     Code Status Orders  (From admission, onward)        Start     Ordered   07/04/17 1844  Full code  Continuous     07/04/17 1844    Code Status History    Date Active Date Inactive Code Status Order ID Comments User Context   02/11/2016 1724 02/14/2016 1558 Full Code [id number]  Henreitta Leber, MD Inpatient    Advance Directive Documentation     Most Recent Value  Type of Advance Directive  Healthcare Power of Attorney  Pre-existing out of facility DNR order (yellow form or pink MOST form)  -  "MOST" Form in Place?  -     Disposition Plan: Reevaluate tomorrow on potential discharge.  Would like to see the patient walk with physical therapy and be afebrile  Antibiotics:  Levaquin  Time spent: 28 minutes  [place]

## 2017-07-06 LAB — HIV ANTIBODY (ROUTINE TESTING W REFLEX): HIV Screen 4th Generation wRfx: NONREACTIVE

## 2017-07-06 MED ORDER — NICOTINE 21 MG/24HR TD PT24: 21.0000 mg | MEDICATED_PATCH | Freq: Every day | TRANSDERMAL | 0 refills | Status: DC

## 2017-07-06 MED ORDER — HYDROCOD POLST-CPM POLST ER 10-8 MG/5ML PO SUER: 5.0000 mL | Freq: Two times a day (BID) | ORAL | 0 refills | Status: DC | PRN

## 2017-07-06 MED ORDER — LEVOFLOXACIN 750 MG PO TABS: 750.0000 mg | ORAL_TABLET | Freq: Every day | ORAL | 0 refills | Status: DC

## 2017-07-06 NOTE — Evaluation (Signed)
Physical Therapy Evaluation Patient Details Name: Becky Hubbard MRN: [id number] DOB: 01-Sep-1970 Today's Date: 07/06/2017   History of Present Illness  Pt admitted for acute delirium. History includes PTSD, COPD, CAP, fibromyalgia, and depression. Per notes, 2 days ago began shaking episodes with spastic movement noted. DC pending at this time and pt appears to be back at baseline level.  Clinical Impression  Pt is a pleasant 47 year old female who was admitted for acute delirium. Pt performs bed mobility/transfers with independence and ambulation with mod I and IV pole. Pt is alert and oriented x 4 at this time and eager to go home. Pt demonstrates all bed mobility/transfers/ambulation at baseline level. Pt does not require any further PT needs at this time. Pt will be dc in house and does not require follow up. RN aware. Will dc current orders.     Follow Up Recommendations No PT follow up    Equipment Recommendations  None recommended by PT    Recommendations for Other Services       Precautions / Restrictions Precautions Precautions: Fall Restrictions Weight Bearing Restrictions: No      Mobility  Bed Mobility Overal bed mobility: Independent             General bed mobility comments: safe technique performed with upright posture noted  Transfers Overall transfer level: Independent Equipment used: None             General transfer comment: safe technique  Ambulation/Gait Ambulation/Gait assistance: Modified independent (Device/Increase time) Ambulation Distance (Feet): 200 Feet Assistive device: IV Pole Gait Pattern/deviations: Step-through pattern     General Gait Details: ambulated with safe technique and able to carry conversation during ambulation. No LOB or weakness noted. No complaints of dizziness  Stairs            Wheelchair Mobility    Modified Rankin (Stroke Patients Only)       Balance Overall balance assessment: Independent                                            Pertinent Vitals/Pain Pain Assessment: No/denies pain    Home Living Family/patient expects to be discharged to:: Private residence Living Arrangements: Alone(mother lives next door and available for assistance) Available Help at Discharge: Family;Available PRN/intermittently Type of Home: House Home Access: Level entry     Home Layout: Two level Home Equipment: Walker - 2 wheels      Prior Function Level of Independence: Independent         Comments: previously independent, occasionally uses RW depending on pain     Hand Dominance        Extremity/Trunk Assessment   Upper Extremity Assessment Upper Extremity Assessment: Overall WFL for tasks assessed    Lower Extremity Assessment Lower Extremity Assessment: Overall WFL for tasks assessed       Communication   Communication: No difficulties  Cognition Arousal/Alertness: Awake/alert Behavior During Therapy: WFL for tasks assessed/performed Overall Cognitive Status: Within Functional Limits for tasks assessed                                        General Comments      Exercises     Assessment/Plan    PT Assessment Patent does not need any further  PT services  PT Problem List         PT Treatment Interventions      PT Goals (Current goals can be found in the Care Plan section)  Acute Rehab PT Goals Patient Stated Goal: to go home PT Goal Formulation: All assessment and education complete, DC therapy Time For Goal Achievement: 07/06/17 Potential to Achieve Goals: Good    Frequency     Barriers to discharge        Co-evaluation               AM-PAC PT "6 Clicks" Daily Activity  Outcome Measure Difficulty turning over in bed (including adjusting bedclothes, sheets and blankets)?: None Difficulty moving from lying on back to sitting on the side of the bed? : None Difficulty sitting down on and standing up from a  chair with arms (e.g., wheelchair, bedside commode, etc,.)?: None Help needed moving to and from a bed to chair (including a wheelchair)?: None Help needed walking in hospital room?: None Help needed climbing 3-5 steps with a railing? : None 6 Click Score: 24    End of Session Equipment Utilized During Treatment: Gait belt Activity Tolerance: Patient tolerated treatment well Patient left: in bed(eating breakfast) Nurse Communication: Mobility status PT Visit Diagnosis: Muscle weakness (generalized) (M62.81)    Time: 7026-3785 PT Time Calculation (min) (ACUTE ONLY): 13 min   Charges:   PT Evaluation $PT Eval Low Complexity: 1 Low     PT G CodesGreggory Stallion, PT, DPT [phone]   [name] 07/06/2017, 11:26 AM

## 2017-07-06 NOTE — Progress Notes (Signed)
VSS. Pt in no acute distress. IV removed. RN explained discharge instructions to pt and family, verbalized understanding. Pt wheeled to visitors entrance and assisted into mothers vehicle by RN.

## 2017-07-06 NOTE — Discharge Summary (Signed)
[place] [facility] [place] NAME: Becky Hubbard    MR#:  [id number]  DATE OF BIRTH:  1970-08-18  DATE OF ADMISSION:  07/04/2017 ADMITTING PHYSICIAN: Idelle Crouch, MD  DATE OF DISCHARGE: 07/06/2017 11:20 AM  PRIMARY CARE PHYSICIAN: Laneta Simmers   ADMISSION DIAGNOSIS:  Metabolic encephalopathy [G40.10] Community acquired pneumonia of right lung, unspecified part of lung [J18.9]  DISCHARGE DIAGNOSIS:  Principal Problem:   Acute delirium Active Problems:   PTSD (post-traumatic stress disorder)   CAP (community acquired pneumonia)   Tachycardia   Acute encephalopathy   SECONDARY DIAGNOSIS:   Past Medical History:  Diagnosis Date  . ADD (attention deficit disorder)   . Anxiety   . Arthritis   . Auditory hallucinations   . Carpal tunnel syndrome on both sides   . Cervical spine fracture ([place]) 2008   Closed with screw C2-3 [facility] Dr. Delilah Shan  . Degenerative joint disease of spine   . Depression   . Family history of adverse reaction to anesthesia    mom- slow to awaken  . Fibromyalgia   . Foot fracture, left   . Gastroparesis   . Gastroparesis   . GERD (gastroesophageal reflux disease)   . Hearing loss   . Herniated cervical disc   . History of prescription drug abuse   . Neuromuscular disorder (HCC)    fibromyalgia/chronic fatique syndrome  . Neuropathy of foot   . OCD (obsessive compulsive disorder)   . 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 Plaque psoriasis   . PTSD (post-traumatic stress disorder)   . Ulcer     HOSPITAL COURSE:   1.  Clinical sepsis present on admission with pneumonia right lung, tachycardia and leukocytosis.  The patient was given high-dose 750 mg Levaquin.  I switched this over to oral upon discharge home for another 3 days.  Patient clinically better on presentation. 2.  Relative hypotension  and tachycardia.  Patient states that her blood pressure is normally on the lower side and normally has a high heart rate. 3.  Anxiety, depression, posttraumatic stress disorder, OCD and ADD.  Patient seen by psychiatry.  Continue current medications.  Follow-up with Dr. Kasandra Knudsen as outpatient. 4.  Tobacco abuse on nicotine patch 5.  GERD on Protonix and Carafate   DISCHARGE CONDITIONS:   Satisfactory  CONSULTS OBTAINED:  Treatment Team:  Clapacs, Madie Reno, MD  DRUG ALLERGIES:   Allergies  Allergen Reactions  . Doxycycline   . Lyrica [Pregabalin] Swelling    Ankles swell and turn red  . Neurontin [Gabapentin]   . Chantix [Varenicline] Rash  . Other Other (See Comments)    Cats - cough/sneezing  . Penicillin G Hives  . Penicillins Hives    Has patient had a PCN reaction causing immediate rash, facial/tongue/throat swelling, SOB or lightheadedness with hypotension: No Has patient had a PCN reaction causing severe rash involving mucus membranes or skin necrosis: No Has patient had a PCN reaction that required hospitalization No Has patient had a PCN reaction occurring within the last 10 years: No If all of the above answers are "NO", then may proceed with Cephalosporin use.   . Sulfa Antibiotics Hives, Rash and Other (See Comments)    DISCHARGE MEDICATIONS:   Allergies as of 07/06/2017      Reactions   Doxycycline    Lyrica [  pregabalin] Swelling   Ankles swell and turn red   Neurontin [gabapentin]    Chantix [varenicline] Rash   Other Other (See Comments)   Cats - cough/sneezing   Penicillin G Hives   Penicillins Hives   Has patient had a PCN reaction causing immediate rash, facial/tongue/throat swelling, SOB or lightheadedness with hypotension: No Has patient had a PCN reaction causing severe rash involving mucus membranes or skin necrosis: No Has patient had a PCN reaction that required hospitalization No Has patient had a PCN reaction occurring within the last 10 years:  No If all of the above answers are "NO", then may proceed with Cephalosporin use.   Sulfa Antibiotics Hives, Rash, Other (See Comments)      Medication List    STOP taking these medications   clonazePAM 1 MG tablet Commonly known as:  KLONOPIN   PROAIR HFA 108 (90 Base) MCG/ACT inhaler Generic drug:  albuterol   traMADol 50 MG tablet Commonly known as:  ULTRAM     TAKE these medications   ADVAIR DISKUS 250-50 MCG/DOSE Aepb Generic drug:  Fluticasone-Salmeterol TAKE 1 PUFF INTO LUNGS TWICE A DAY   aspirin 81 MG tablet Take 81 mg by mouth daily.   buPROPion 150 MG 12 hr tablet Commonly known as:  WELLBUTRIN SR TAKE 1 TABLET EVERY DAY FOR 3 DAYS, THEN1 TAB TWICE A DAY PICK A DAY IN THE SECOND WEEK TO QUIT SMOKING   chlorpheniramine-HYDROcodone 10-8 MG/5ML Suer Commonly known as:  TUSSIONEX Take 5 mLs by mouth every 12 (twelve) hours as needed for cough.   cyclobenzaprine 10 MG tablet Commonly known as:  FLEXERIL TAKE 1 TABLET BY MOUTH EVERY DAY AS NEEDED AND 2 TABLETS BY MOUTH EVERY EVENING AT BEDTIME. **MUST LAST 30 DAYS**   diazepam 10 MG tablet Commonly known as:  VALIUM   DULoxetine 30 MG capsule Commonly known as:  CYMBALTA Take 90 mg by mouth daily.   levofloxacin 750 MG tablet Commonly known as:  LEVAQUIN Take 1 tablet (750 mg total) by mouth at bedtime.   loratadine 10 MG tablet Commonly known as:  CLARITIN TAKE ONE TABLET EVERY DAY   nicotine 21 mg/24hr patch Commonly known as:  NICODERM CQ - dosed in mg/24 hours Place 1 patch (21 mg total) onto the skin daily. Okay to substitute generic What changed:  additional instructions   nicotine polacrilex 4 MG lozenge Commonly known as:  EQL NICOTINE POLACRILEX Take 1 lozenge (4 mg total) by mouth as needed for smoking cessation.   pantoprazole 40 MG tablet Commonly known as:  PROTONIX TAKE ONE TABLET EVERY DAY   RA VITAMIN B-12 TR 1000 MCG Tbcr Generic drug:  Cyanocobalamin Take by mouth.    risperiDONE 1 MG tablet Commonly known as:  RISPERDAL Take 1 mg by mouth at bedtime.   SM MULTIPLE VITAMINS/IRON Tabs Take by mouth.   Vitamin D3 1000 units Caps Take by mouth.        DISCHARGE INSTRUCTIONS:   Follow-up PMD 6 days   If you experience worsening of your admission symptoms, develop shortness of breath, life threatening emergency, suicidal or homicidal thoughts you must seek medical attention immediately by calling 911 or calling your MD immediately  if symptoms less severe.  You Must read complete instructions/literature along with all the possible adverse reactions/side effects for all the Medicines you take and that have been prescribed to you. Take any new Medicines after you have completely understood and accept all the possible adverse reactions/side effects.  Please note  You were cared for by a hospitalist during your hospital stay. If you have any questions about your discharge medications or the care you received while you were in the hospital after you are discharged, you can call the unit and asked to speak with the hospitalist on call if the hospitalist that took care of you is not available. Once you are discharged, your primary care physician will handle any further medical issues. Please note that NO REFILLS for any discharge medications will be authorized once you are discharged, as it is imperative that you return to your primary care physician (or establish a relationship with a primary care physician if you do not have one) for your aftercare needs so that they can reassess your need for medications and monitor your lab values.    Today   CHIEF COMPLAINT:   Chief Complaint  Patient presents with  . Tachycardia    HISTORY OF PRESENT ILLNESS:  Becky Hubbard  is a 47 y.o. female came in with weakness and shaking of her extremities   VITAL SIGNS:  Blood pressure 107/88, pulse (!) 103, temperature 98.3 F (36.8 C), temperature source Oral, resp.  rate 18, height 5\' 5"  (1.651 m), weight 85.6 kg (188 lb 12.8 oz), SpO2 96 %.   PHYSICAL EXAMINATION:  GENERAL:  47 y.o.-year-old patient lying in the bed with no acute distress.  EYES: Pupils equal, round, reactive to light and accommodation. No scleral icterus. Extraocular muscles intact.  HEENT: Head atraumatic, normocephalic. Oropharynx and nasopharynx clear.  NECK:  Supple, no jugular venous distention. No thyroid enlargement, no tenderness.  LUNGS: Normal breath sounds bilaterally, no wheezing, rales,rhonchi or crepitation. No use of accessory muscles of respiration.  CARDIOVASCULAR: S1, S2 normal. No murmurs, rubs, or gallops.  ABDOMEN: Soft, non-tender, non-distended. Bowel sounds present. No organomegaly or mass.  EXTREMITIES: No pedal edema, cyanosis, or clubbing.  NEUROLOGIC: Cranial nerves II through XII are intact. Muscle strength 5/5 in all extremities. Sensation intact. Gait not checked.  PSYCHIATRIC: The patient is alert and oriented x 3.  SKIN: No obvious rash, lesion, or ulcer.   DATA REVIEW:   CBC Recent Labs  Lab 07/05/17 0432  WBC 13.5*  HGB 12.9  HCT 37.2  PLT 218    Chemistries  Recent Labs  Lab 07/05/17 0432  NA 137  K 3.7  CL 106  CO2 23  GLUCOSE 125*  BUN 9  CREATININE 0.58  CALCIUM 9.1  AST 21  ALT 22  ALKPHOS 47  BILITOT 0.6    Cardiac Enzymes Recent Labs  Lab 07/04/17 1459  TROPONINI <0.03    Microbiology Results  Results for orders placed or performed during the hospital encounter of 07/04/17  Blood culture (routine x 2)     Status: None (Preliminary result)   Collection Time: 07/04/17  4:35 PM  Result Value Ref Range Status   Specimen Description BLOOD RIGHT ANTECUBITAL  Final   Special Requests   Final    BOTTLES DRAWN AEROBIC AND ANAEROBIC Blood Culture results may not be optimal due to an excessive volume of blood received in culture bottles   Culture   Final    NO GROWTH 2 DAYS Performed at [facility], [address] [place], [place] [address]    Report Status PENDING  Incomplete  Blood culture (routine x 2)     Status: None (Preliminary result)   Collection Time: 07/04/17  4:35 PM  Result Value Ref Range Status  Specimen Description BLOOD LEFT ANTECUBITAL  Final   Special Requests   Final    BOTTLES DRAWN AEROBIC AND ANAEROBIC Blood Culture results may not be optimal due to an inadequate volume of blood received in culture bottles   Culture   Final    NO GROWTH 2 DAYS Performed at [facility], [address], [place], [place] [address]    Report Status PENDING  Incomplete    RADIOLOGY:  Dg Chest 2 View  Result Date: 07/04/2017 CLINICAL DATA:  Pt comes into the ED via [facility] clinic where they sent her for increased HR and "jumpiness". Patient takes Cymbalta and risperidone and they [facility] clinic thinks these symptoms may be related to medication use. Patient appears to be altered per her mother. Patient denies any new pain at this time. Patient has even and unlabored respirations. Patient unable to sit still at this time. Patient has a h/o prescription drug abuse in the past but states that she hasnt taken anything outside of her normal prescriptions. Current smoker 1 ppd. EXAM: CHEST - 2 VIEW COMPARISON:  02/11/2016 and 02/06/2016. FINDINGS: Cardiac silhouette is normal in size and configuration. Normal mediastinal and hilar contours. Patchy peribronchovascular opacities are noted on the right in a perihilar distribution, not present on the prior studies. Remainder of the lungs is clear. No pleural effusion or pneumothorax. Skeletal structures are unremarkable. IMPRESSION: 1. Patchy right perihilar airspace opacities. Findings are consistent with pneumonia in the proper clinical setting. No evidence of pulmonary edema. Electronically Signed   By: Lajean Manes M.D.   On: 07/04/2017 16:01     Management plans discussed with the patient,  and she is in agreement.  CODE STATUS:     Code  Status Orders  (From admission, onward)        Start     Ordered   07/04/17 1844  Full code  Continuous     07/04/17 1844    Code Status History    Date Active Date Inactive Code Status Order ID Comments User Context   02/11/2016 1724 02/14/2016 1558 Full Code [id number]  Henreitta Leber, MD Inpatient    Advance Directive Documentation     Most Recent Value  Type of Advance Directive  Healthcare Power of Attorney  Pre-existing out of facility DNR order (yellow form or pink MOST form)  -  "MOST" Form in Place?  -      TOTAL TIME TAKING CARE OF THIS PATIENT: 35 minutes.    Loletha Grayer M.D on 07/06/2017 at 2:54 PM  Between 7am to 6pm - Pager - [phone]  After 6pm go to [email or web address] - Proofreader  [employer] [facility]  [phone]  CC: Primary care physician; Laneta Simmers

## 2017-07-09 LAB — CULTURE, BLOOD (ROUTINE X 2)
Culture: NO GROWTH
Culture: NO GROWTH

## 2018-09-19 ENCOUNTER — Other Ambulatory Visit: Payer: Self-pay | Admitting: Family Medicine

## 2018-10-05 ENCOUNTER — Telehealth: Payer: Self-pay | Admitting: Obstetrics & Gynecology

## 2018-10-05 NOTE — Telephone Encounter (Signed)
Becky Hubbard referring for pap smear and symptoms concerning uterine prolapse. Called and left voicemail for patient to call back to be schedule

## 2018-10-06 NOTE — Telephone Encounter (Signed)
Called and left voice mail for patient to call back to be schedule °

## 2018-10-14 ENCOUNTER — Encounter: Payer: Self-pay | Admitting: Obstetrics and Gynecology

## 2018-10-14 ENCOUNTER — Other Ambulatory Visit (HOSPITAL_COMMUNITY)
Admission: RE | Admit: 2018-10-14 | Discharge: 2018-10-14 | Disposition: A | Discharge status: Home / Self Care | Payer: Medicaid Other | Source: Ambulatory Visit | Attending: Obstetrics and Gynecology | Admitting: Obstetrics and Gynecology

## 2018-10-14 ENCOUNTER — Telehealth: Payer: Self-pay | Admitting: Obstetrics and Gynecology

## 2018-10-14 ENCOUNTER — Other Ambulatory Visit: Payer: Self-pay

## 2018-10-14 ENCOUNTER — Ambulatory Visit (INDEPENDENT_AMBULATORY_CARE_PROVIDER_SITE_OTHER): Payer: Medicaid Other | Admitting: Obstetrics and Gynecology

## 2018-10-14 VITALS — BP 110/80 | Ht 63.5 in | Wt 101.0 lb

## 2018-10-14 DIAGNOSIS — Z30017 Encounter for initial prescription of implantable subdermal contraceptive: Secondary | ICD-10-CM

## 2018-10-14 DIAGNOSIS — Z124 Encounter for screening for malignant neoplasm of cervix: Secondary | ICD-10-CM | POA: Insufficient documentation

## 2018-10-14 NOTE — Telephone Encounter (Signed)
Patient scheduled 9/14 for nexplanon insertion with SDJ at [facility]

## 2018-10-14 NOTE — Progress Notes (Signed)
Obstetrics & Gynecology Office Visit   Chief Complaint  Patient presents with  . Pap only  . Contraception    interested in [facility], not on any at the moment  . Vaginal Prolapse  Referral from Laneta Simmers, NP, from [facility], for pap smear and symptoms of uterine prolapse.   History of Present Illness: 48 y.o. G71P0020 female who presents in referral from Laneta Simmers, NP, from [facility] for uterine prolapse, birth control, pap smear.    She states her last pap smear was in 07/2015: normal cytology, HPV negative  She is still having menses.  She gets menses monthly, every 28 days, lasting 3-4 days.  Her menses are not heavy, except maybe a little the first day. She has some mild pain right before she starts. She has been on contraception in the past, the NuvaRing was last. It has been 10-15 years since she has been on the NuvaRing.  She is in a new relationship and wants to make sure nothing happens.  She states that she has had a mild heart attack for which she is soon to be evaluated (she has not had an EKG, but believes she had one and wants to be evaluated by a cardiologist).  She states that she has diabetes. She smokes 1 PPD.  She also states that she has had 7 mini-strokes.   In terms of prolapse, she saw something coming out in the mirror. She says it looked like a "hamburger" and felt like a "cow's tongue" and it was dark colored.  This was sticking outside her vagina.  She states that she is having trouble urinating. When she coughs she will leak a little urine when she coughs.  This started about 2 weeks ago.  She has gastroparesis.  She does not have to splint to void or defecate.        Past Medical History:  Diagnosis Date  . ADD (attention deficit disorder)   . Anxiety   . Arthritis   . Auditory hallucinations   . Carpal tunnel syndrome on both sides   . Cervical spine fracture ([place]) 2008   Closed with screw C2-3 [facility] Dr. Delilah Shan  . Degenerative  joint disease of spine   . Depression   . Diabetes mellitus without complication ([place])   . Family history of adverse reaction to anesthesia    mom- slow to awaken  . Fibromyalgia   . Foot fracture, left   . Gastroparesis   . Gastroparesis   . GERD (gastroesophageal reflux disease)   . Hearing loss   . Herniated cervical disc   . History of prescription drug abuse   . Neuromuscular disorder (HCC)    fibromyalgia/chronic fatique syndrome  . Neuropathy of foot   . OCD (obsessive compulsive disorder)   . 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 Plaque psoriasis   . PTSD (post-traumatic stress disorder)   . Ulcer     Past Surgical History:  Procedure Laterality Date  . CARPAL TUNNEL RELEASE Left 07/12/2015   Procedure: OPEN CARPAL TUNNEL RELEASE OF LEFT HAND;  Surgeon: Leanor Kail, MD;  Location: [place];  Service: Orthopedics;  Laterality: Left;  . cervical radiofrequency neurotomy    . FRACTURE SURGERY    . MOUTH SURGERY     upper and lower dentures  . NASAL SEPTUM SURGERY    .  occipital nerve blocks    . SPINE SURGERY     cervical    Gynecologic History: Patient's last menstrual period was 10/03/2018 (exact date).  Obstetric History: G2P0020  Family History  Problem Relation Age of Onset  . Cancer Mother 78       breast cancer  . Migraines Mother   . Hypertension Mother   . Arthritis Mother   . Sleep apnea Mother   . Anxiety disorder Mother   . Depression Mother   . Diabetes Father   . Cancer Father 34       of spine , pancreas and liver  . Anxiety disorder Father   . Depression Father   . Diabetes Brother   . Hyperlipidemia Brother   . Sleep apnea Brother   . Cancer Maternal Grandfather        leukemia, prostate  . Cancer Paternal Grandfather        bone  . Cancer Maternal Aunt        breast  . Mental illness Maternal Grandmother         Dementia  . Stroke Paternal Grandmother   . Diabetes Brother   . Cancer Paternal Aunt        breast    Social History   Socioeconomic History  . Marital status: Unknown    Spouse name: Not on file  . Number of children: Not on file  . Years of education: Not on file  . Highest education level: Not on file  Occupational History  . Not on file  Social Needs  . Financial resource strain: Not on file  . Food insecurity    Worry: Not on file    Inability: Not on file  . Transportation needs    Medical: Not on file    Non-medical: Not on file  Tobacco Use  . Smoking status: Current Every Day Smoker    Packs/day: 1.00    Years: 20.00    Pack years: 20.00    Types: Cigarettes    Start date: 03/01/2011  . Smokeless tobacco: Never Used  Substance and Sexual Activity  . Alcohol use: No  . Drug use: No  . Sexual activity: Yes    Birth control/protection: None  Lifestyle  . Physical activity    Days per week: Not on file    Minutes per session: Not on file  . Stress: Not on file  Relationships  . Social Herbalist on phone: Not on file    Gets together: Not on file    Attends religious service: Not on file    Active member of club or organization: Not on file    Attends meetings of clubs or organizations: Not on file    Relationship status: Not on file  . Intimate partner violence    Fear of current or ex partner: Not on file    Emotionally abused: Not on file    Physically abused: Not on file    Forced sexual activity: Not on file  Other Topics Concern  . Not on file  Social History Narrative   Depression- history of suicide attempt and hospitalization about 1998 after divorce   H/O hospitalization at [facility] for narcotics detox/withdrawal approx. 2000    Allergies  Allergen Reactions  . Doxycycline   . Lyrica [Pregabalin] Swelling    Ankles swell and turn red  . Neurontin [Gabapentin]   . Chantix [Varenicline] Rash  . Prednisone  Other (See Comments)  .  Other Other (See Comments)    Cats - cough/sneezing  . Penicillin G Hives  . Penicillins Hives    Has patient had a PCN reaction causing immediate rash, facial/tongue/throat swelling, SOB or lightheadedness with hypotension: No Has patient had a PCN reaction causing severe rash involving mucus membranes or skin necrosis: No Has patient had a PCN reaction that required hospitalization No Has patient had a PCN reaction occurring within the last 10 years: No If all of the above answers are "NO", then may proceed with Cephalosporin use.   . Sulfa Antibiotics Hives, Rash and Other (See Comments)    Prior to Admission medications   Medication Sig Start Date End Date Taking? Authorizing Provider  ADVAIR DISKUS 250-50 MCG/DOSE AEPB TAKE 1 PUFF INTO LUNGS TWICE A DAY 01/12/17  Yes Johnson, Megan P, DO  amphetamine-dextroamphetamine (ADDERALL XR) 30 MG 24 hr capsule Take by mouth. 06/24/18  Yes [provider]  aspirin 81 MG tablet Take 81 mg by mouth daily.   Yes [provider]  buPROPion (WELLBUTRIN SR) 150 MG 12 hr tablet TAKE 1 TABLET EVERY DAY FOR 3 DAYS, THEN1 TAB TWICE A DAY PICK A DAY IN THE SECOND WEEK TO QUIT SMOKING 07/14/16  Yes Johnson, Megan P, DO  Cholecalciferol (VITAMIN D3) 1000 UNITS CAPS Take by mouth.   Yes [provider]  Cyanocobalamin (RA VITAMIN B-12 TR) 1000 MCG TBCR Take by mouth.   Yes [provider]  cyclobenzaprine (FLEXERIL) 10 MG tablet TAKE 1 TABLET BY MOUTH EVERY DAY AS NEEDED AND 2 TABLETS BY MOUTH EVERY EVENING AT BEDTIME. **MUST LAST 30 DAYS** 06/23/16  Yes Johnson, Megan P, DO  diazepam (VALIUM) 10 MG tablet  06/22/17  Yes [provider]  DULoxetine (CYMBALTA) 30 MG capsule Take 90 mg by mouth daily. 06/22/17  Yes [provider]  DULoxetine (CYMBALTA) 60 MG capsule  06/20/18  Yes [provider]  EPINEPHrine 0.3 mg/0.3 mL IJ SOAJ injection Inject into the muscle.   Yes [provider]  gabapentin (NEURONTIN) 100 MG capsule Take by mouth.   Yes [provider]  haloperidol (HALDOL) 2 MG tablet Take by mouth.   Yes [provider]  metFORMIN (GLUCOPHAGE) 500 MG tablet Take by mouth.   Yes [provider]  nicotine (NICODERM CQ - DOSED IN MG/24 HOURS) 21 mg/24hr patch Place 1 patch (21 mg total) onto the skin daily. Okay to substitute generic 07/06/17  Yes Leslye Peer, Richard, MD  pantoprazole (PROTONIX) 40 MG tablet TAKE ONE TABLET EVERY DAY 09/04/16  Yes Johnson, Megan P, DO  SM MULTIPLE VITAMINS/IRON TABS Take by mouth. 12/25/05  Yes [provider]    Review of Systems  Constitutional: Negative.   HENT: Negative.   Eyes: Negative.   Respiratory: Negative.   Cardiovascular: Negative.   Gastrointestinal: Negative.   Genitourinary: Negative.   Musculoskeletal: Negative.   Skin: Negative.   Neurological: Negative.   Psychiatric/Behavioral: Negative.      Physical Exam BP 110/80   Ht 5' 3.5" (1.613 m)   Wt 101 lb (45.8 kg)   LMP 10/03/2018 (Exact Date)   BMI 17.61 kg/m  Patient's last menstrual period was 10/03/2018 (exact date). Physical Exam Constitutional:      General: She is not in acute distress.    Appearance: Normal appearance. She is well-developed.  Genitourinary:     Pelvic exam was performed with patient in the lithotomy position.     Vulva, inguinal canal, urethra, bladder, vagina, uterus,  right adnexa and left adnexa normal.     No posterior fourchette tenderness, injury or lesion present.     Urethral prolapse (minimal anteriorly (stage 1/4)) present.     No cervical friability, lesion, bleeding or polyp.  HENT:     Head: Normocephalic and atraumatic.  Eyes:     General: No scleral icterus.    Conjunctiva/sclera: Conjunctivae normal.  Neck:     Musculoskeletal: Normal range of motion and neck supple.  Cardiovascular:     Rate and Rhythm: Normal rate and regular rhythm.     Heart sounds: No  murmur. No friction rub. No gallop.   Pulmonary:     Effort: Pulmonary effort is normal. No respiratory distress.     Breath sounds: Normal breath sounds. No wheezing or rales.  Abdominal:     General: Bowel sounds are normal. There is no distension.     Palpations: Abdomen is soft. There is no mass.     Tenderness: There is no abdominal tenderness. There is no guarding or rebound.  Musculoskeletal: Normal range of motion.  Neurological:     General: No focal deficit present.     Mental Status: She is alert and oriented to person, place, and time.     Cranial Nerves: No cranial nerve deficit.  Skin:    General: Skin is warm and dry.     Findings: No erythema.  Psychiatric:        Mood and Affect: Mood normal.        Behavior: Behavior normal.        Judgment: Judgment normal.     Female chaperone present for pelvic and breast  portions of the physical exam  Assessment: 48 y.o. G5P0020 female here for  1. Encounter for initial prescription of implantable subdermal contraceptive   2. Pap smear for cervical cancer screening      Plan: Problem List Items Addressed This Visit    None    Visit Diagnoses    Encounter for initial prescription of implantable subdermal contraceptive    -  Primary   Pap smear for cervical cancer screening       Relevant Orders   Cytology - PAP     Pap smear accomplished today  Reviewed all forms of birth control options available including abstinence; over the counter/barrier methods; hormonal contraceptive medication including pill, patch, ring, injection,contraceptive implant; hormonal and nonhormonal IUDs; permanent sterilization options including vasectomy and the various tubal sterilization modalities. Risks and benefits reviewed.  Questions were answered.  Information was given to patient to review. She seems to be a reasonable candidate for a Nexplanon implant. We discussed the potential side-effects, including abnormal bleeding. According to  the [facility] Medical Eligibility Criteria, this is a level 2 out of 4 ( A condition for which the advantages of using the method generally outweigh the theoretical or proven risk) in terms of risk, given her medical co-morbidities.  I believe she would be at very high risk, if pregnancy occurred. We discussed this in detail and she would like to proceed.   Prolapse: No evidence of significant prolapse.  The amount of pelvic floor weakening appears to correlate well to her age. She has no symptoms at this time. So, will continue to monitor. It may worsen after she goes through the menopausal transition.  30 minutes spent in face to face discussion with > 50% spent in counseling,management, and coordination of care of her Contraceptive management (initial prescription of implantable subdermal implant), her pap  smear, and her prolapse.   Prentice Docker, MD 10/14/2018 5:07 PM     CC: Laneta Simmers, [address] [place],  [place] [id number]

## 2018-10-18 NOTE — Telephone Encounter (Signed)
Noted. Nexplanon reserved.

## 2018-10-21 LAB — CYTOLOGY - PAP
Diagnosis: NEGATIVE
Diagnosis: REACTIVE
HPV: NOT DETECTED

## 2018-10-24 ENCOUNTER — Ambulatory Visit: Payer: Medicaid Other | Admitting: Obstetrics and Gynecology

## 2018-10-28 NOTE — Telephone Encounter (Signed)
Pt cancelled apt per Epic. Nexplanon placed back in stock.

## 2018-11-03 ENCOUNTER — Telehealth: Payer: Self-pay | Admitting: Obstetrics and Gynecology

## 2018-11-03 DIAGNOSIS — Z872 Personal history of diseases of the skin and subcutaneous tissue: Secondary | ICD-10-CM | POA: Insufficient documentation

## 2018-11-03 NOTE — Telephone Encounter (Signed)
Patient coming in on 11/15/2018 at 1:50pm with [facility] for nexplanon removal and replacement. Pt had appt on 10/24/18 that wasn't kept and there was a nexplanon reserved for her.

## 2018-11-11 NOTE — Telephone Encounter (Signed)
Nexplanon reserved for this patient.

## 2018-11-15 ENCOUNTER — Encounter: Payer: Self-pay | Admitting: Obstetrics and Gynecology

## 2018-11-15 ENCOUNTER — Other Ambulatory Visit: Payer: Self-pay

## 2018-11-15 ENCOUNTER — Ambulatory Visit (INDEPENDENT_AMBULATORY_CARE_PROVIDER_SITE_OTHER): Payer: Medicaid Other | Admitting: Obstetrics and Gynecology

## 2018-11-15 VITALS — BP 126/74 | Ht 63.0 in | Wt 203.0 lb

## 2018-11-15 DIAGNOSIS — Z30017 Encounter for initial prescription of implantable subdermal contraceptive: Secondary | ICD-10-CM | POA: Diagnosis not present

## 2018-11-15 MED ORDER — NEXPLANON 68 MG ~~LOC~~ IMPL
1.0000 | DRUG_IMPLANT | Freq: Once | SUBCUTANEOUS | 0 refills | Status: DC
Start: 2018-11-15 — End: 2020-04-25

## 2018-11-15 NOTE — Progress Notes (Signed)
    GYNECOLOGY PROCEDURE NOTE  Patient is a 48 y.o. G2P0020 presenting for Nexplanon insertion as her desires means of contraception.  She provided informed consent, signed copy in the chart, time out was performed. Pregnancy test was negative, with self reported LMP of No LMP recorded.  She understands that Nexplanon is a progesterone only therapy, and that patients often patients have irregular and unpredictable vaginal bleeding or amenorrhea. She understands that other side effects are possible related to systemic progesterone, including but not limited to, headaches, breast tenderness, nausea, and irritability. While effective at preventing pregnancy long acting reversible contraceptives do not prevent transmission of sexually transmitted diseases and use of barrier methods for this purpose was discussed. The placement procedure for Nexplanon was reviewed with the patient in detail including risks of nerve injury, infection, bleeding and injury to other muscles or tendons. She understands that the Nexplanon implant is good for 3 years and needs to be removed at the end of that time.  She understands that Nexplanon is an extremely effective option for contraception, with failure rate of <1%. This information is reviewed today and all questions were answered. Informed consent was obtained, both verbally and written.   The patient is healthy and has no contraindications to Nexplanon use. Urine pregnancy test was performed today and was negative.  Procedure Appropriate time out taken.  Patient placed in dorsal supine with left arm above head, elbow flexed at 90 degrees, arm resting on examination table with hand behind her head.  The bicipital grove was palpated and site 8-10cm proximal to the medial epicondyle was indentified.  Per the manufacturer's recommendations, the insertion site was marked along a line 3-5 cm posterior (toward the triceps) to the bicipital groove and at 8-10 cm medial to the medial  epicondyle. The insertion site was prepped with a two betadine swabs and then injected with 3 mL of 1% lidocaine without epinephrine.  Nexplanon removed form sterile blister packaging,  Device confirmed in needle, before inserting full length of needle, tenting up the skin as the needle was advance.  The drug eluting rod was then deployed by pulling back the slider per the manufactures recommendation.  The implant was palpable by the clinician as well as the patient.  The insertion site covered dressed with a 1/2" steri-strip before applying  a kerlex bandage pressure dressing..Minimal blood loss was noted during the procedure.  The patient tolerated the procedure well.   She was instructed to wear the bandage for 24 hours, call with any signs of infection.  She was given the Nexplanon card and instructed to have the rod removed in 3 years.  Prentice Docker, MD, [facility], [facility] 11/15/2018 2:49 PM

## 2019-02-01 ENCOUNTER — Ambulatory Visit: Payer: Medicaid Other | Admitting: Obstetrics and Gynecology

## 2019-03-02 ENCOUNTER — Ambulatory Visit: Payer: Medicaid Other | Admitting: Obstetrics and Gynecology

## 2019-03-16 ENCOUNTER — Ambulatory Visit: Payer: Medicaid Other | Admitting: Obstetrics and Gynecology

## 2019-05-01 NOTE — Telephone Encounter (Signed)
Nexplanon rcvd/charged 11/15/2018

## 2019-05-05 DIAGNOSIS — K219 Gastro-esophageal reflux disease without esophagitis: Secondary | ICD-10-CM | POA: Insufficient documentation

## 2019-06-20 ENCOUNTER — Other Ambulatory Visit: Payer: Self-pay | Admitting: Family

## 2019-06-20 DIAGNOSIS — Z1231 Encounter for screening mammogram for malignant neoplasm of breast: Secondary | ICD-10-CM

## 2019-10-03 NOTE — Progress Notes (Deleted)
No chief complaint on file.     History of Present Illness: 49 yo female with history of tobacco abuse, ADD, anxiety, depression, PTSD, OCD, borderline DM, fibromyalgia, GERD, gastroparesis and arthritis who is here today as a new patient for the evaluation of ***. She was seen in [facility] cardiology in November 2020 with c/o chest pain. Coronary CTA at [facility] November 2020 ***.   Primary Care Physician: Patient, No Pcp Per   Past Medical History:  Diagnosis Date  . ADD (attention deficit disorder)   . Anxiety   . Arthritis   . Auditory hallucinations   . Carpal tunnel syndrome on both sides   . Cervical spine fracture ([place]) 2008   Closed with screw C2-3 [facility] Dr. Delilah Shan  . Degenerative joint disease of spine   . Depression   . Diabetes mellitus without complication ([place])   . Family history of adverse reaction to anesthesia    mom- slow to awaken  . Fibromyalgia   . Foot fracture, left   . Gastroparesis   . Gastroparesis   . GERD (gastroesophageal reflux disease)   . Hearing loss   . Herniated cervical disc   . History of prescription drug abuse   . Neuromuscular disorder (HCC)    fibromyalgia/chronic fatique syndrome  . Neuropathy of foot   . OCD (obsessive compulsive disorder)   . 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 Plaque psoriasis   . PTSD (post-traumatic stress disorder)   . Ulcer     Past Surgical History:  Procedure Laterality Date  . CARPAL TUNNEL RELEASE Left 07/12/2015   Procedure: OPEN CARPAL TUNNEL RELEASE OF LEFT HAND;  Surgeon: Leanor Kail, MD;  Location: [place];  Service: Orthopedics;  Laterality: Left;  . cervical radiofrequency neurotomy    . FRACTURE SURGERY    . MOUTH SURGERY     upper and lower dentures  . NASAL SEPTUM SURGERY    . occipital nerve blocks    . SPINE SURGERY     cervical    Current Outpatient Medications   Medication Sig Dispense Refill  . ADVAIR DISKUS 250-50 MCG/DOSE AEPB TAKE 1 PUFF INTO LUNGS TWICE A DAY 60 each 0  . amphetamine-dextroamphetamine (ADDERALL XR) 30 MG 24 hr capsule Take by mouth.    Marland Kitchen aspirin 81 MG tablet Take 81 mg by mouth daily.    Marland Kitchen buPROPion (WELLBUTRIN SR) 150 MG 12 hr tablet TAKE 1 TABLET EVERY DAY FOR 3 DAYS, THEN1 TAB TWICE A DAY PICK A DAY IN THE SECOND WEEK TO QUIT SMOKING 60 tablet 3  . Cholecalciferol (VITAMIN D3) 1000 UNITS CAPS Take by mouth.    . Cyanocobalamin (RA VITAMIN B-12 TR) 1000 MCG TBCR Take by mouth.    . cyclobenzaprine (FLEXERIL) 10 MG tablet TAKE 1 TABLET BY MOUTH EVERY DAY AS NEEDED AND 2 TABLETS BY MOUTH EVERY EVENING AT BEDTIME. **MUST LAST 30 DAYS** 90 tablet 6  . diazepam (VALIUM) 10 MG tablet   1  . DULoxetine (CYMBALTA) 30 MG capsule Take 90 mg by mouth daily.  4  . DULoxetine (CYMBALTA) 60 MG capsule     . EPINEPHrine 0.3 mg/0.3 mL IJ SOAJ injection Inject into the muscle.    . etonogestrel (NEXPLANON) 68 MG IMPL implant 1 each (68 mg total) by Subdermal route once for 1 dose. 1 each 0  .  gabapentin (NEURONTIN) 100 MG capsule Take by mouth.    . haloperidol (HALDOL) 2 MG tablet Take by mouth.    . metFORMIN (GLUCOPHAGE) 500 MG tablet Take by mouth.    . nicotine (NICODERM CQ - DOSED IN MG/24 HOURS) 21 mg/24hr patch Place 1 patch (21 mg total) onto the skin daily. Okay to substitute generic 30 patch 0  . pantoprazole (PROTONIX) 40 MG tablet TAKE ONE TABLET EVERY DAY 30 tablet 3  . SM MULTIPLE VITAMINS/IRON TABS Take by mouth.     No current facility-administered medications for this visit.    Allergies  Allergen Reactions  . Doxycycline   . Lyrica [Pregabalin] Swelling    Ankles swell and turn red  . Neurontin [Gabapentin]   . Chantix [Varenicline] Rash  . Prednisone Other (See Comments)  . Other Other (See Comments)    Cats - cough/sneezing  . Penicillin G Hives  . Penicillins Hives    Has patient had a PCN reaction causing  immediate rash, facial/tongue/throat swelling, SOB or lightheadedness with hypotension: No Has patient had a PCN reaction causing severe rash involving mucus membranes or skin necrosis: No Has patient had a PCN reaction that required hospitalization No Has patient had a PCN reaction occurring within the last 10 years: No If all of the above answers are "NO", then may proceed with Cephalosporin use.   . Sulfa Antibiotics Hives, Rash and Other (See Comments)    Social History   Socioeconomic History  . Marital status: Unknown    Spouse name: Not on file  . Number of children: Not on file  . Years of education: Not on file  . Highest education level: Not on file  Occupational History  . Not on file  Tobacco Use  . Smoking status: Current Every Day Smoker    Packs/day: 1.00    Years: 20.00    Pack years: 20.00    Types: Cigarettes    Start date: 03/01/2011  . Smokeless tobacco: Never Used  Vaping Use  . Vaping Use: Never used  Substance and Sexual Activity  . Alcohol use: No  . Drug use: No  . Sexual activity: Yes    Birth control/protection: None  Other Topics Concern  . Not on file  Social History Narrative   Depression- history of suicide attempt and hospitalization about 1998 after divorce   H/O hospitalization at [facility] for narcotics detox/withdrawal approx. 2000   Social Determinants of Health   Financial Resource Strain:   . Difficulty of Paying Living Expenses: Not on file  Food Insecurity:   . Worried About Charity fundraiser in the Last Year: Not on file  . Ran Out of Food in the Last Year: Not on file  Transportation Needs:   . Lack of Transportation (Medical): Not on file  . Lack of Transportation (Non-Medical): Not on file  Physical Activity:   . Days of Exercise per Week: Not on file  . Minutes of Exercise per Session: Not on file  Stress:   . Feeling of Stress : Not on file  Social Connections:   . Frequency of Communication with  Friends and Family: Not on file  . Frequency of Social Gatherings with Friends and Family: Not on file  . Attends Religious Services: Not on file  . Active Member of Clubs or Organizations: Not on file  . Attends Archivist Meetings: Not on file  . Marital Status: Not on file  Intimate Partner Violence:   .  Fear of Current or Ex-Partner: Not on file  . Emotionally Abused: Not on file  . Physically Abused: Not on file  . Sexually Abused: Not on file    Family History  Problem Relation Age of Onset  . Cancer Mother 63       breast cancer  . Migraines Mother   . Hypertension Mother   . Arthritis Mother   . Sleep apnea Mother   . Anxiety disorder Mother   . Depression Mother   . Diabetes Father   . Cancer Father 21       of spine , pancreas and liver  . Anxiety disorder Father   . Depression Father   . Diabetes Brother   . Hyperlipidemia Brother   . Sleep apnea Brother   . Cancer Maternal Grandfather        leukemia, prostate  . Cancer Paternal Grandfather        bone  . Cancer Maternal Aunt        breast  . Mental illness Maternal Grandmother        Dementia  . Stroke Paternal Grandmother   . Diabetes Brother   . Cancer Paternal Aunt        breast    Review of Systems:  As stated in the HPI and otherwise negative.   There were no vitals taken for this visit.  Physical Examination: General: Well developed, well nourished, NAD  HEENT: OP clear, mucus membranes moist  SKIN: warm, dry. No rashes. Neuro: No focal deficits  Musculoskeletal: Muscle strength 5/5 all ext  Psychiatric: Mood and affect normal  Neck: No JVD, no carotid bruits, no thyromegaly, no lymphadenopathy.  Lungs:Clear bilaterally, no wheezes, rhonci, crackles Cardiovascular: Regular rate and rhythm. No murmurs, gallops or rubs. Abdomen:Soft. Bowel sounds present. Non-tender.  Extremities: No lower extremity edema. Pulses are 2 + in the bilateral DP/PT.  EKG:  EKG {ACTION; IS/IS  GQQ:[id number]} ordered today. The ekg ordered today demonstrates ***  Recent Labs: No results found for requested labs within last 8760 hours.   Lipid Panel    Component Value Date/Time   CHOL 158 07/04/2015 1521   CHOL 162 07/31/2014 1606   TRIG 248 (H) 07/04/2015 1521   TRIG 260 (H) 07/31/2014 1606   HDL 29 (L) 07/04/2015 1521   VLDL 52 (H) 07/31/2014 1606   LDLCALC 79 07/04/2015 1521     Wt Readings from Last 3 Encounters:  11/15/18 203 lb (92.1 kg)  10/14/18 101 lb (45.8 kg)  07/06/17 188 lb 12.8 oz (85.6 kg)     Other studies Reviewed: Additional studies/ records that were reviewed today include: ***. Review of the above records demonstrates: ***   Assessment and Plan:   1.   Current medicines are reviewed at length with the patient today.  The patient {ACTIONS; HAS/DOES NOT HAVE:19233} concerns regarding medicines.  The following changes have been made:  {PLAN; NO CHANGE:13088:s}  Labs/ tests ordered today include: *** No orders of the defined types were placed in this encounter.    Disposition:   FU with *** in {gen number 2-67:124580} {TIME; UNITS DAY/WEEK/MONTH:19136}   Signed, Lauree Chandler, MD 10/03/2019 12:19 PM    [facility] [place], [place], [place]  [address] Phone: [phone]; Fax: [phone]

## 2019-10-04 ENCOUNTER — Ambulatory Visit: Payer: Medicaid Other | Admitting: Cardiovascular Disease

## 2019-11-01 ENCOUNTER — Encounter (INDEPENDENT_AMBULATORY_CARE_PROVIDER_SITE_OTHER): Payer: Self-pay

## 2019-11-01 ENCOUNTER — Encounter: Payer: Self-pay | Admitting: Internal Medicine

## 2019-11-01 ENCOUNTER — Ambulatory Visit (INDEPENDENT_AMBULATORY_CARE_PROVIDER_SITE_OTHER): Payer: Medicaid Other | Admitting: Internal Medicine

## 2019-11-01 ENCOUNTER — Other Ambulatory Visit: Payer: Self-pay

## 2019-11-01 VITALS — BP 122/72 | HR 88 | Ht 63.0 in | Wt 187.0 lb

## 2019-11-01 DIAGNOSIS — Z720 Tobacco use: Secondary | ICD-10-CM | POA: Diagnosis not present

## 2019-11-01 DIAGNOSIS — E119 Type 2 diabetes mellitus without complications: Secondary | ICD-10-CM | POA: Diagnosis not present

## 2019-11-01 DIAGNOSIS — R002 Palpitations: Secondary | ICD-10-CM

## 2019-11-01 DIAGNOSIS — R079 Chest pain, unspecified: Secondary | ICD-10-CM

## 2019-11-01 DIAGNOSIS — E7849 Other hyperlipidemia: Secondary | ICD-10-CM

## 2019-11-01 DIAGNOSIS — E1149 Type 2 diabetes mellitus with other diabetic neurological complication: Secondary | ICD-10-CM | POA: Insufficient documentation

## 2019-11-01 NOTE — Progress Notes (Signed)
Cardiology Office Note:    Date:  11/01/2019   ID:  Becky Hubbard, DOB August 19, 1970, MRN [id number]  PCP:  Laneta Simmers, NP  [facility] Cardiologist:  No primary care provider on file.  [facility] Electrophysiologist:  None   Referring MD: Laneta Simmers, NP   CC: Stress and atrial fibrillation Consulted for the evaluation of chest pain at the behest of [place], Benjamine Mola, NP  History of Present Illness:    Becky Hubbard is a 49 y.o. female with a hx of tobaco abuse, Diabetes without complication (improving with weight loss) who presents for chest pain.  Patient notes that she has been trying to get her health taken care.  Patient has been trying to take care of her mother (moved back in with her).  Patient is disabled due to psychiatric condition.  Patent is trying to be more active and is getting a treadmill.  Has been dealing with a lot of stress.  Patient notes that she was having palpitations.  Patient thinks that she was going into atrial fibrillation during those times.  Never had atrial fibrillation outside of these issues.  Last feeling Sunday.  Before that a month.    Patient notes that she had a mild heart attack:  Sternal chest pain; no radiation. In April when she was moving with her mom.  No SOB, DOE, PND, [place].  Again notes these symptoms when her mother is not mean to her.    Has Seen a Heart Doctor in [facility] who did no testing and didn't get back in touch with her.  Was originally planned to see [facility].  Past Medical History:  Diagnosis Date  . ADD (attention deficit disorder)   . Anxiety   . Arthritis   . Auditory hallucinations   . Carpal tunnel syndrome on both sides   . Cervical spine fracture ([place]) 2008   Closed with screw C2-3 [facility] Dr. Delilah Shan  . Degenerative joint disease of spine   . Depression   . Diabetes mellitus without complication ([place])   . Family history of adverse reaction to anesthesia    mom- slow to awaken  . Fibromyalgia   .  Foot fracture, left   . Gastroparesis   . Gastroparesis   . GERD (gastroesophageal reflux disease)   . Hearing loss   . Herniated cervical disc   . History of prescription drug abuse   . Neuromuscular disorder (HCC)    fibromyalgia/chronic fatique syndrome  . Neuropathy of foot   . OCD (obsessive compulsive disorder)   . 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 Plaque psoriasis   . PTSD (post-traumatic stress disorder)   . Ulcer    Past Surgical History:  Procedure Laterality Date  . CARPAL TUNNEL RELEASE Left 07/12/2015   Procedure: OPEN CARPAL TUNNEL RELEASE OF LEFT HAND;  Surgeon: Leanor Kail, MD;  Location: [place];  Service: Orthopedics;  Laterality: Left;  . cervical radiofrequency neurotomy    . FRACTURE SURGERY    . MOUTH SURGERY     upper and lower dentures  . NASAL SEPTUM SURGERY    . occipital nerve blocks    . SPINE SURGERY     cervical    Current Medications: Current Meds  Medication Sig  . ADVAIR DISKUS 250-50 MCG/DOSE AEPB TAKE 1 PUFF INTO LUNGS TWICE A DAY  . ascorbic  acid (VITAMIN C) 1000 MG tablet Take by mouth daily.  Marland Kitchen aspirin 81 MG tablet Take 81 mg by mouth daily.  . busPIRone (BUSPAR) 10 MG tablet Take 10 mg by mouth as needed.  . Cholecalciferol (VITAMIN D3) 1000 UNITS CAPS Take by mouth.  . Cyanocobalamin (RA VITAMIN B-12 TR) 1000 MCG TBCR Take by mouth.  . cyclobenzaprine (FLEXERIL) 10 MG tablet TAKE 1 TABLET BY MOUTH EVERY DAY AS NEEDED AND 2 TABLETS BY MOUTH EVERY EVENING AT BEDTIME. **MUST LAST 30 DAYS**  . diazepam (VALIUM) 10 MG tablet 3 (three) times daily.   . DULoxetine (CYMBALTA) 30 MG capsule Take 60 mg by mouth daily.   Marland Kitchen EPINEPHrine 0.3 mg/0.3 mL IJ SOAJ injection Inject into the muscle.  . escitalopram (LEXAPRO) 10 MG tablet Take 10 mg by mouth daily.  Marland Kitchen etonogestrel (NEXPLANON) 68 MG IMPL implant 1 each (68 mg  total) by Subdermal route once for 1 dose.  . fluticasone (FLONASE) 50 MCG/ACT nasal spray Place into both nostrils as needed.  . haloperidol (HALDOL) 2 MG tablet Take by mouth.  . metFORMIN (GLUCOPHAGE) 500 MG tablet Take by mouth.  . nicotine (NICODERM CQ - DOSED IN MG/24 HOURS) 21 mg/24hr patch Place 1 patch (21 mg total) onto the skin daily. Okay to substitute generic  . pantoprazole (PROTONIX) 40 MG tablet TAKE ONE TABLET EVERY DAY  . prazosin (MINIPRESS) 2 MG capsule Take 2 mg by mouth at bedtime.  Marland Kitchen PROAIR HFA 108 (90 Base) MCG/ACT inhaler Inhale 2 puffs into the lungs every 6 (six) hours as needed.  Marland Kitchen SM MULTIPLE VITAMINS/IRON TABS Take by mouth.  Marland Kitchen VYVANSE 40 MG capsule Take 40 mg by mouth every morning.  . zolpidem (AMBIEN) 10 MG tablet Take 10 mg by mouth at bedtime.  . [DISCONTINUED] gabapentin (NEURONTIN) 100 MG capsule Take by mouth.     Allergies:   Doxycycline, Lyrica [pregabalin], Neurontin [gabapentin], Chantix [varenicline], Prednisone, Other, Penicillin g, Penicillins, and Sulfa antibiotics   Social History   Socioeconomic History  . Marital status: Unknown    Spouse name: Not on file  . Number of children: Not on file  . Years of education: Not on file  . Highest education level: Not on file  Occupational History  . Not on file  Tobacco Use  . Smoking status: Current Every Day Smoker    Packs/day: 1.00    Years: 20.00    Pack years: 20.00    Types: Cigarettes    Start date: 03/01/2011  . Smokeless tobacco: Never Used  Vaping Use  . Vaping Use: Never used  Substance and Sexual Activity  . Alcohol use: No  . Drug use: No  . Sexual activity: Yes    Birth control/protection: None  Other Topics Concern  . Not on file  Social History Narrative   Depression- history of suicide attempt and hospitalization about 1998 after divorce   H/O hospitalization at [facility] for narcotics detox/withdrawal approx. 2000   Social Determinants of Health    Financial Resource Strain:   . Difficulty of Paying Living Expenses: Not on file  Food Insecurity:   . Worried About Charity fundraiser in the Last Year: Not on file  . Ran Out of Food in the Last Year: Not on file  Transportation Needs:   . Lack of Transportation (Medical): Not on file  . Lack of Transportation (Non-Medical): Not on file  Physical Activity:   . Days of Exercise per Week: Not on  file  . Minutes of Exercise per Session: Not on file  Stress:   . Feeling of Stress : Not on file  Social Connections:   . Frequency of Communication with Friends and Family: Not on file  . Frequency of Social Gatherings with Friends and Family: Not on file  . Attends Religious Services: Not on file  . Active Member of Clubs or Organizations: Not on file  . Attends Archivist Meetings: Not on file  . Marital Status: Not on file     Family History: The patient's family history includes Anxiety disorder in her father and mother; Arthritis in her mother; Cancer in her maternal aunt, maternal grandfather, paternal aunt, and paternal grandfather; Cancer (age of onset: 81) in her mother; Cancer (age of onset: 56) in her father; Depression in her father and mother; Diabetes in her brother, brother, and father; Hyperlipidemia in her brother; Hypertension in her mother; Mental illness in her maternal grandmother; Migraines in her mother; Sleep apnea in her brother and mother; Stroke in her paternal grandmother. Dementia in mother/  No arrhythmias.  ROS:   Please see the history of present illness.    All other systems reviewed and are negative.  EKGs/Labs/Other Studies Reviewed:    The following studies were reviewed today:  EKG:  EKG is  ordered today.  The ekg ordered today demonstrates SR rate 88, No ST/T changes 07/04/17- sinus tachycardia rate 115, No STT changes, low voltage (precordial)  Recent Labs: No results found for requested labs within last 8760 hours.  Recent Lipid  Panel    Component Value Date/Time   CHOL 158 07/04/2015 1521   CHOL 162 07/31/2014 1606   TRIG 248 (H) 07/04/2015 1521   TRIG 260 (H) 07/31/2014 1606   HDL 29 (L) 07/04/2015 1521   VLDL 52 (H) 07/31/2014 1606   LDLCALC 79 07/04/2015 1521    Physical Exam:    VS:  BP 122/72   Pulse 88   Ht 5\' 3"  (1.6 m)   Wt 187 lb (84.8 kg)   SpO2 99%   BMI 33.13 kg/m     Wt Readings from Last 3 Encounters:  11/01/19 187 lb (84.8 kg)  11/15/18 203 lb (92.1 kg)  10/14/18 101 lb (45.8 kg)    GEN: Well nourished, well developed in no acute distress HEENT: Normal NECK: No JVD; No carotid bruits LYMPHATICS: No lymphadenopathy CARDIAC: RRR, no murmurs, rubs, gallops RESPIRATORY:  Clear to auscultation without rales, wheezing or rhonchi  ABDOMEN: Soft, non-tender, non-distended MUSCULOSKELETAL:  No edema; No deformity  SKIN: Warm and dry NEUROLOGIC:  Alert and oriented x 3 PSYCHIATRIC:  Normal affect   ASSESSMENT:    1. Chest pain of uncertain etiology   2. Type 2 diabetes mellitus without complication, without long-term current use of insulin (HCC)   3. Other hyperlipidemia   4. Tobacco use    PLAN:    In order of problems listed above:  Chest Pain and Preoperative Testing; ASCVD Risk Estimated at 10% - The patient presents with atypical chest pain.  - CVD risk factors include DM and Tobacco Use.  - Will obtain lipid profile; previously on crestor and unsure why she stopped - continue 81 mg ASA  Preoperative Risk Assessment - The Revised Cardiac Risk Index = 0 (low risk ENT, CAD, CHF, CVA, DM on insulin, Scr >2), which equates to 0.4% estimated risk of perioperative myocardial infarction, pulmonary edema, ventricular fibrillation, cardiac arrest, or complete heart block.  - Due  to hx of prior MI and A fib (unclear hx without prior record support), we will get an echocardiogram   Palpitations - vague hx of AF; will start with ZioPatch (14 day non live)  Tobacco Use- currently  on patch 1. The patient was counseled on the dangers of tobacco use, both inhaled and oral, which include, but are not limited to cardiovascular disease, increased cancer risk of multiple types of cancer, COPD, peripheral vascular disease, strokes. 2. She was also counseled on the benefits of smoking cessation. 3. The patient was firmly advised to quit.    4. We also reviewed strategies to maximize success, including:  Removing cigarettes and smoking materials from environment  Stress management  Substitution of other forms of reinforcement Support of family/friends.  Selecting a quit date.  Patient provided contact information for 1-800-QUIT-NOW   1 year follow up unless new symptoms or abnormal test results warranting change in plan   Medication Adjustments/Labs and Tests Ordered: Current medicines are reviewed at length with the patient today.  Concerns regarding medicines are outlined above.  Orders Placed This Encounter  Procedures  . Lipid panel  . EKG 12-Lead   No orders of the defined types were placed in this encounter.   There are no Patient Instructions on file for this visit.   Signed, Werner Lean, MD  11/01/2019 2:34 PM    [place]

## 2019-11-01 NOTE — Patient Instructions (Signed)
Medication Instructions:  NO CHANGES *If you need a refill on your cardiac medications before your next appointment, please call your pharmacy*   Lab Work: TODAY LIPIDS If you have labs (blood work) drawn today and your tests are completely normal, you will receive your results only by: Marland Kitchen MyChart Message (if you have MyChart) OR . A paper copy in the mail If you have any lab test that is abnormal or we need to change your treatment, we will call you to review the results.   Testing/Procedures: Your physician has requested that you have an echocardiogram. Echocardiography is a painless test that uses sound waves to create images of your heart. It provides your doctor with information about the size and shape of your heart and how well your heart's chambers and valves are working. This procedure takes approximately one hour. There are no restrictions for this procedure.  Your physician has recommended that you wear an event monitor. Event monitors are medical devices that record the heart's electrical activity. Doctors most often [place] these monitors to diagnose arrhythmias. Arrhythmias are problems with the speed or rhythm of the heartbeat. The monitor is a small, portable device. You can wear one while you do your normal daily activities. This is usually used to diagnose what is causing palpitations/syncope (passing out).  10 DAY ZIO PATCH NON LIVE   Follow-Up: At [facility], you and your health needs are our priority.  As part of our continuing mission to provide you with exceptional heart care, we have created designated Provider Care Teams.  These Care Teams include your primary Cardiologist (physician) and Advanced Practice Providers (APPs -  Physician Assistants and Nurse Practitioners) who all work together to provide you with the care you need, when you need it.  We recommend signing up for the patient portal called "MyChart".  Sign up information is provided on this After Visit  Summary.  MyChart is used to connect with patients for Virtual Visits (Telemedicine).  Patients are able to view lab/test results, encounter notes, upcoming appointments, etc.  Non-urgent messages can be sent to your provider as well.   To learn more about what you can do with MyChart, go to [email or web address].    Your next appointment:   1 year(s)  The format for your next appointment:   In Person  Provider:   Rudean Haskell, MD   Other Instructions

## 2019-11-01 NOTE — Addendum Note (Signed)
Addended by: Harland German A on: 11/01/2019 02:46 PM   Modules accepted: Orders

## 2019-11-01 NOTE — Addendum Note (Signed)
Addended by: Devra Dopp E on: 11/01/2019 02:50 PM   Modules accepted: Orders

## 2019-11-02 LAB — LIPID PANEL
Chol/HDL Ratio: 3.9 ratio (ref 0.0–4.4)
Cholesterol, Total: 136 mg/dL (ref 100–199)
HDL: 35 mg/dL — ABNORMAL LOW (ref >39)
LDL Chol Calc (NIH): 80 mg/dL (ref 0–99)
Triglycerides: 113 mg/dL (ref 0–149)
VLDL Cholesterol Cal: 21 mg/dL (ref 5–40)

## 2019-11-08 ENCOUNTER — Telehealth: Payer: Self-pay | Admitting: Internal Medicine

## 2019-11-08 NOTE — Telephone Encounter (Signed)
New Message:    Pt says the light will not come on the Monitor. She thinks it is messed up.

## 2019-11-09 NOTE — Telephone Encounter (Signed)
Light would not come on the monitor.  Patient removed monitor.  Monitor worn less than 4 days.  [employer] will be notified to ship a the patient a replacement monitor.  Patient instructed to return original monitor to [facility] in package with prepaid postage provided by [facility].

## 2019-11-15 ENCOUNTER — Other Ambulatory Visit: Payer: Self-pay

## 2019-11-15 ENCOUNTER — Ambulatory Visit (HOSPITAL_COMMUNITY): Payer: Medicaid Other | Attending: Cardiovascular Disease

## 2019-11-15 DIAGNOSIS — R079 Chest pain, unspecified: Secondary | ICD-10-CM | POA: Diagnosis present

## 2019-11-15 LAB — ECHOCARDIOGRAM COMPLETE
Area-P 1/2: 3.6 cm2
S' Lateral: 2.6 cm

## 2019-11-16 ENCOUNTER — Telehealth: Payer: Self-pay | Admitting: Internal Medicine

## 2019-11-16 NOTE — Telephone Encounter (Signed)
See results for echo.

## 2019-11-16 NOTE — Telephone Encounter (Signed)
Patient is returning a call from our office to go over Echo results.

## 2019-11-22 ENCOUNTER — Other Ambulatory Visit: Payer: Self-pay

## 2019-11-22 ENCOUNTER — Emergency Department
Admission: EM | Admit: 2019-11-22 | Discharge: 2019-11-23 | Disposition: A | Discharge status: Home / Self Care | Payer: Medicaid Other | Attending: Emergency Medicine | Admitting: Emergency Medicine

## 2019-11-22 ENCOUNTER — Encounter: Payer: Self-pay | Admitting: Emergency Medicine

## 2019-11-22 DIAGNOSIS — T7840XA Allergy, unspecified, initial encounter: Secondary | ICD-10-CM | POA: Diagnosis present

## 2019-11-22 DIAGNOSIS — J441 Chronic obstructive pulmonary disease with (acute) exacerbation: Secondary | ICD-10-CM | POA: Diagnosis not present

## 2019-11-22 DIAGNOSIS — E119 Type 2 diabetes mellitus without complications: Secondary | ICD-10-CM | POA: Diagnosis not present

## 2019-11-22 DIAGNOSIS — T782XXA Anaphylactic shock, unspecified, initial encounter: Secondary | ICD-10-CM | POA: Diagnosis not present

## 2019-11-22 DIAGNOSIS — Z7982 Long term (current) use of aspirin: Secondary | ICD-10-CM | POA: Diagnosis not present

## 2019-11-22 DIAGNOSIS — Z7984 Long term (current) use of oral hypoglycemic drugs: Secondary | ICD-10-CM | POA: Insufficient documentation

## 2019-11-22 DIAGNOSIS — F17210 Nicotine dependence, cigarettes, uncomplicated: Secondary | ICD-10-CM | POA: Diagnosis not present

## 2019-11-22 HISTORY — DX: Cerebral infarction, unspecified: I63.9

## 2019-11-22 MED ORDER — FAMOTIDINE IN NACL 20-0.9 MG/50ML-% IV SOLN
20.0000 mg | Freq: Once | INTRAVENOUS | Status: AC
  Administered 2019-11-22: 20 mg via INTRAVENOUS
  Filled 2019-11-22: qty 50

## 2019-11-22 MED ORDER — EPINEPHRINE 0.3 MG/0.3ML IJ SOAJ: 0.3000 mg | INTRAMUSCULAR | 0 refills | Status: DC | PRN

## 2019-11-22 MED ORDER — METHYLPREDNISOLONE SODIUM SUCC 125 MG IJ SOLR
125.0000 mg | Freq: Once | INTRAMUSCULAR | Status: AC
  Administered 2019-11-22: 125 mg via INTRAVENOUS
  Filled 2019-11-22: qty 2

## 2019-11-22 MED ORDER — EPINEPHRINE 0.3 MG/0.3ML IJ SOAJ: 0.3000 mg | Freq: Once | INTRAMUSCULAR | Status: AC

## 2019-11-22 MED ORDER — EPINEPHRINE 0.3 MG/0.3ML IJ SOAJ
INTRAMUSCULAR | Status: AC
  Administered 2019-11-22: 0.3 mg via INTRAMUSCULAR
  Filled 2019-11-22: qty 0.3

## 2019-11-22 NOTE — ED Triage Notes (Signed)
Pt to ED from home c/o allergic reaction today.  States sprayed new perfume on self around 1330, started getting rash, itching, hives, swelling to bottom lip around 1600.  Has epipen at home but didn't use it because it's outdated.  Took total 8 antihistamines since 1600.  Chest rise even and unlabored, speaking in complete and coherent sentences, itching returning.

## 2019-11-22 NOTE — ED Provider Notes (Signed)
[facility] Emergency Department Provider Note   ____________________________________________   First MD Initiated Contact with Patient 11/22/19 2153     (approximate)  I have reviewed the triage vital signs and the nursing notes.   HISTORY  Chief Complaint Allergic Reaction    HPI Becky Hubbard is a 49 y.o. female with past medical history of diabetes, stroke, and fibromyalgia who presents to the ED complaining of allergic reaction.  Patient reports that she started noticing diffuse itchy and red rash around 4 PM this afternoon.  She has been trying to treat with over-the-counter antihistamines, but rash has persisted and she has started to feel like her throat was closing up.  She has an EpiPen at home for similar reactions, but did not use hers because it was expired.  She continues to feel like her throat is closing with mild difficulty breathing, denies any vomiting, diarrhea, or lightheadedness.  She thinks she could be reacting to perfume she used around 1:30, she also recently moved in with her mother, who is has a cat that she is allergic to.        Past Medical History:  Diagnosis Date  . ADD (attention deficit disorder)   . Anxiety   . Arthritis   . Auditory hallucinations   . Carpal tunnel syndrome on both sides   . Cervical spine fracture ([place]) 2008   Closed with screw C2-3 [facility] Dr. Delilah Shan  . Degenerative joint disease of spine   . Depression   . Diabetes mellitus without complication ([place])   . Family history of adverse reaction to anesthesia    mom- slow to awaken  . Fibromyalgia   . Foot fracture, left   . Gastroparesis   . Gastroparesis   . GERD (gastroesophageal reflux disease)   . Hearing loss   . Herniated cervical disc   . History of prescription drug abuse   . Neuromuscular disorder (HCC)    fibromyalgia/chronic fatique syndrome  . Neuropathy of foot   . OCD (obsessive compulsive disorder)   . Other specified disorder of  skin    neurological skin disorder-breaks out when nervous  . 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 Plaque psoriasis   . PTSD (post-traumatic stress disorder)   . Stroke ([place])   . Ulcer     Patient Active Problem List   Diagnosis Date Noted  . Type 2 diabetes mellitus without complication, without long-term current use of insulin ([place]) 11/01/2019  . Other hyperlipidemia 11/01/2019  . Acute delirium 07/05/2017  . CAP (community acquired pneumonia) 07/04/2017  . Tachycardia 07/04/2017  . Acute encephalopathy 07/04/2017  . Tobacco use 03/03/2016  . COPD exacerbation ([place]) 02/11/2016  . Obsessive-compulsive disorder 01/17/2015  . Borderline diabetes 09/04/2014  . Depression, major, recurrent, moderate ([place]) 08/21/2014  . ADD (attention deficit disorder) 07/31/2014  . Dermatitis, eczematoid 07/31/2014  . Allergy to environmental factors 07/31/2014  . Disorder of nervous system 07/31/2014  . 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 Plaque psoriasis 07/31/2014  . Cicatrix 07/31/2014  . Avitaminosis D 07/31/2014  . Loss of feeling or sensation 06/04/2014  . Auditory hallucinations   . Herniated cervical disc   . PTSD (post-traumatic stress disorder)   . Nonspecific abnormal results of endocrine function study 04/19/2014  . Carpal tunnel syndrome 10/03/2012  . Chest pain of uncertain etiology 09/38/1829  . Acid indigestion 07/21/2012  . 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 Fibromyalgia syndrome 01/22/2012  . Fibrositis  01/22/2012  . Cervical osteoarthritis 03/10/2011    Past Surgical History:  Procedure Laterality Date  . CARPAL TUNNEL RELEASE Left 07/12/2015   Procedure: OPEN CARPAL TUNNEL RELEASE OF LEFT HAND;  Surgeon: Leanor Kail, MD;  Location: [place];  Service: Orthopedics;  Laterality: Left;  . cervical radiofrequency neurotomy    . FRACTURE SURGERY    . MOUTH SURGERY     upper and lower dentures  . NASAL SEPTUM SURGERY    . occipital nerve  blocks    . SPINE SURGERY     cervical    Prior to Admission medications   Medication Sig Start Date End Date Taking? Authorizing Provider  ADVAIR DISKUS 250-50 MCG/DOSE AEPB TAKE 1 PUFF INTO LUNGS TWICE A DAY 01/12/17   Johnson, Megan P, DO  ascorbic acid (VITAMIN C) 1000 MG tablet Take by mouth daily. 12/25/05   [provider]  aspirin 81 MG tablet Take 81 mg by mouth daily.    [provider]  busPIRone (BUSPAR) 10 MG tablet Take 10 mg by mouth as needed. 10/17/19   [provider]  Cholecalciferol (VITAMIN D3) 1000 UNITS CAPS Take by mouth.    [provider]  Cyanocobalamin (RA VITAMIN B-12 TR) 1000 MCG TBCR Take by mouth.    [provider]  cyclobenzaprine (FLEXERIL) 10 MG tablet TAKE 1 TABLET BY MOUTH EVERY DAY AS NEEDED AND 2 TABLETS BY MOUTH EVERY EVENING AT BEDTIME. **MUST LAST 30 DAYS** 06/23/16   Johnson, Megan P, DO  diazepam (VALIUM) 10 MG tablet 3 (three) times daily.  06/22/17   [provider]  DULoxetine (CYMBALTA) 30 MG capsule Take 60 mg by mouth daily.  06/22/17   [provider]  EPINEPHrine 0.3 mg/0.3 mL IJ SOAJ injection Inject 0.3 mg into the muscle as needed for anaphylaxis. 11/22/19   Blake Divine, MD  escitalopram (LEXAPRO) 10 MG tablet Take 10 mg by mouth daily. 10/18/19   [provider]  etonogestrel (NEXPLANON) 68 MG IMPL implant 1 each (68 mg total) by Subdermal route once for 1 dose. 11/15/18 11/01/19  Will Bonnet, MD  fluticasone (FLONASE) 50 MCG/ACT nasal spray Place into both nostrils as needed. 10/31/19   [provider]  haloperidol (HALDOL) 2 MG tablet Take by mouth.    [provider]  metFORMIN (GLUCOPHAGE) 500 MG tablet Take by mouth.    [provider]  nicotine (NICODERM CQ - DOSED IN MG/24 HOURS) 21 mg/24hr patch Place 1 patch (21 mg total) onto the skin daily. Okay to substitute generic 07/06/17   Loletha Grayer, MD  pantoprazole (PROTONIX) 40 MG  tablet TAKE ONE TABLET EVERY DAY 09/04/16   Park Liter P, DO  prazosin (MINIPRESS) 2 MG capsule Take 2 mg by mouth at bedtime. 09/25/19   [provider]  PROAIR HFA 108 (90 Base) MCG/ACT inhaler Inhale 2 puffs into the lungs every 6 (six) hours as needed. 10/02/19   [provider]  SM MULTIPLE VITAMINS/IRON TABS Take by mouth. 12/25/05   [provider]  VYVANSE 40 MG capsule Take 40 mg by mouth every morning. 10/25/19   [provider]  zolpidem (AMBIEN) 10 MG tablet Take 10 mg by mouth at bedtime. 10/23/19   [provider]    Allergies Doxycycline, Lyrica [pregabalin], Neurontin [gabapentin], Chantix [varenicline], Prednisone, Other, Penicillin g, Penicillins, and Sulfa antibiotics  Family History  Problem Relation Age of Onset  . Cancer Mother 21       breast  cancer  . Migraines Mother   . Hypertension Mother   . Arthritis Mother   . Sleep apnea Mother   . Anxiety disorder Mother   . Depression Mother   . Diabetes Father   . Cancer Father 42       of spine , pancreas and liver  . Anxiety disorder Father   . Depression Father   . Diabetes Brother   . Hyperlipidemia Brother   . Sleep apnea Brother   . Cancer Maternal Grandfather        leukemia, prostate  . Cancer Paternal Grandfather        bone  . Cancer Maternal Aunt        breast  . Mental illness Maternal Grandmother        Dementia  . Stroke Paternal Grandmother   . Diabetes Brother   . Cancer Paternal Aunt        breast    Social History Social History   Tobacco Use  . Smoking status: Current Every Day Smoker    Packs/day: 1.00    Years: 20.00    Pack years: 20.00    Types: Cigarettes    Start date: 03/01/2011  . Smokeless tobacco: Never Used  Vaping Use  . Vaping Use: Never used  Substance Use Topics  . Alcohol use: No  . Drug use: No    Review of Systems  Constitutional: No fever/chills Eyes: No visual changes. ENT: No sore  throat. Cardiovascular: Denies chest pain. Respiratory: Positive for shortness of breath. Gastrointestinal: No abdominal pain.  No nausea, no vomiting.  No diarrhea.  No constipation. Genitourinary: Negative for dysuria. Musculoskeletal: Negative for back pain. Skin: Positive for rash. Neurological: Negative for headaches, focal weakness or numbness.  ____________________________________________   PHYSICAL EXAM:  VITAL SIGNS: ED Triage Vitals  Enc Vitals Group     BP 11/22/19 2133 137/70     Pulse Rate 11/22/19 2133 (!) 107     Resp 11/22/19 2133 18     Temp 11/22/19 2133 99.2 F (37.3 C)     Temp Source 11/22/19 2133 Oral     SpO2 11/22/19 2133 100 %     Weight 11/22/19 2134 184 lb (83.5 kg)     Height 11/22/19 2134 5\' 5"  (1.651 m)     Head Circumference --      Peak Flow --      Pain Score 11/22/19 2146 0     Pain Loc --      Pain Edu? --      Excl. in [place]? --     Constitutional: Alert and oriented. Eyes: Conjunctivae are normal. Head: Atraumatic. Nose: No congestion/rhinnorhea. Mouth/Throat: Mucous membranes are moist.  Oropharynx clear with no edema. Neck: Normal ROM Cardiovascular: Normal rate, regular rhythm. Grossly normal heart sounds. Respiratory: Normal respiratory effort.  No retractions. Lungs CTAB. Gastrointestinal: Soft and nontender. No distention. Genitourinary: deferred Musculoskeletal: No lower extremity tenderness nor edema. Neurologic:  Normal speech and language. No gross focal neurologic deficits are appreciated. Skin:  Skin is warm, dry and intact.  Hives noted across chest, bilateral arms, and face. Psychiatric: Mood and affect are normal. Speech and behavior are normal.  ____________________________________________   LABS (all labs ordered are listed, but only abnormal results are displayed)  Labs Reviewed - No data to display   PROCEDURES  Procedure(s) performed (including Critical  Care):  Procedures   ____________________________________________   INITIAL IMPRESSION / ASSESSMENT AND PLAN / ED COURSE  49 year old female with past medical history of diabetes, stroke, and fibromyalgia presents to the ED complaining of allergic reaction with diffuse rash and feeling like her throat is closing.  She is not in any respiratory distress on my evaluation and her oropharynx appears clear.  She does have appear to have diffuse hives and given reported difficulty breathing, we will treat with EpiPen.  She has had multiple doses of antihistamine prior to arrival and we will hold off on further Benadryl.  We will treat with steroids and Pepcid, plan to observe for 3 to 4 hours from EpiPen administration to ensure no recurrent symptoms.  Patient symptoms are improved, we will continue to observe until approximately 1 AM to ensure no recurrence of symptoms.  Prescription for EpiPen sent to her pharmacy.  Patient turned over to oncoming provider pending reassessment.      ____________________________________________   FINAL CLINICAL IMPRESSION(S) / ED DIAGNOSES  Final diagnoses:  Anaphylaxis, initial encounter  Allergic reaction, initial encounter     ED Discharge Orders         Ordered    EPINEPHrine 0.3 mg/0.3 mL IJ SOAJ injection  As needed        11/22/19 2307           Note:  This document was prepared using Dragon voice recognition software and may include unintentional dictation errors.   Blake Divine, MD 11/22/19 2308

## 2019-11-22 NOTE — ED Notes (Signed)
Pt ambulated to bathroom with steady gait. 

## 2019-11-23 NOTE — ED Provider Notes (Signed)
I assumed care of the patient from Dr. Charna Archer at 11:00 PM.  On re-evaluation patient states all symptoms resolved at present.   Gregor Hams, MD 11/23/19 [phone]

## 2019-12-12 ENCOUNTER — Ambulatory Visit: Payer: Medicaid Other | Admitting: Obstetrics and Gynecology

## 2020-01-05 ENCOUNTER — Other Ambulatory Visit: Payer: Self-pay

## 2020-01-05 ENCOUNTER — Encounter: Payer: Self-pay | Admitting: Emergency Medicine

## 2020-01-05 ENCOUNTER — Emergency Department (EMERGENCY_DEPARTMENT_HOSPITAL)
Admission: EM | Admit: 2020-01-05 | Discharge: 2020-01-07 | Disposition: A | Discharge status: Other Acute Inpatient Hospital | Payer: Medicaid Other | Source: Home / Self Care | Attending: Emergency Medicine | Admitting: Emergency Medicine

## 2020-01-05 DIAGNOSIS — Z20822 Contact with and (suspected) exposure to covid-19: Secondary | ICD-10-CM | POA: Insufficient documentation

## 2020-01-05 DIAGNOSIS — Z7984 Long term (current) use of oral hypoglycemic drugs: Secondary | ICD-10-CM | POA: Insufficient documentation

## 2020-01-05 DIAGNOSIS — G5790 Unspecified mononeuropathy of unspecified lower limb: Secondary | ICD-10-CM | POA: Diagnosis present

## 2020-01-05 DIAGNOSIS — F333 Major depressive disorder, recurrent, severe with psychotic symptoms: Secondary | ICD-10-CM | POA: Insufficient documentation

## 2020-01-05 DIAGNOSIS — E119 Type 2 diabetes mellitus without complications: Secondary | ICD-10-CM | POA: Insufficient documentation

## 2020-01-05 DIAGNOSIS — Z881 Allergy status to other antibiotic agents status: Secondary | ICD-10-CM

## 2020-01-05 DIAGNOSIS — Z7982 Long term (current) use of aspirin: Secondary | ICD-10-CM | POA: Insufficient documentation

## 2020-01-05 DIAGNOSIS — Z79899 Other long term (current) drug therapy: Secondary | ICD-10-CM | POA: Insufficient documentation

## 2020-01-05 DIAGNOSIS — F909 Attention-deficit hyperactivity disorder, unspecified type: Secondary | ICD-10-CM | POA: Insufficient documentation

## 2020-01-05 DIAGNOSIS — J441 Chronic obstructive pulmonary disease with (acute) exacerbation: Secondary | ICD-10-CM | POA: Insufficient documentation

## 2020-01-05 DIAGNOSIS — Z818 Family history of other mental and behavioral disorders: Secondary | ICD-10-CM

## 2020-01-05 DIAGNOSIS — Z8249 Family history of ischemic heart disease and other diseases of the circulatory system: Secondary | ICD-10-CM

## 2020-01-05 DIAGNOSIS — Z8261 Family history of arthritis: Secondary | ICD-10-CM

## 2020-01-05 DIAGNOSIS — R45851 Suicidal ideations: Secondary | ICD-10-CM | POA: Diagnosis present

## 2020-01-05 DIAGNOSIS — Z882 Allergy status to sulfonamides status: Secondary | ICD-10-CM

## 2020-01-05 DIAGNOSIS — F29 Unspecified psychosis not due to a substance or known physiological condition: Secondary | ICD-10-CM

## 2020-01-05 DIAGNOSIS — Z833 Family history of diabetes mellitus: Secondary | ICD-10-CM

## 2020-01-05 DIAGNOSIS — F4310 Post-traumatic stress disorder, unspecified: Secondary | ICD-10-CM | POA: Diagnosis present

## 2020-01-05 DIAGNOSIS — F17210 Nicotine dependence, cigarettes, uncomplicated: Secondary | ICD-10-CM | POA: Insufficient documentation

## 2020-01-05 DIAGNOSIS — Z880 Allergy status to penicillin: Secondary | ICD-10-CM

## 2020-01-05 DIAGNOSIS — Z91048 Other nonmedicinal substance allergy status: Secondary | ICD-10-CM

## 2020-01-05 DIAGNOSIS — E1141 Type 2 diabetes mellitus with diabetic mononeuropathy: Secondary | ICD-10-CM | POA: Diagnosis present

## 2020-01-05 DIAGNOSIS — N390 Urinary tract infection, site not specified: Secondary | ICD-10-CM | POA: Diagnosis present

## 2020-01-05 DIAGNOSIS — Z803 Family history of malignant neoplasm of breast: Secondary | ICD-10-CM

## 2020-01-05 DIAGNOSIS — F429 Obsessive-compulsive disorder, unspecified: Secondary | ICD-10-CM | POA: Diagnosis present

## 2020-01-05 DIAGNOSIS — F3160 Bipolar disorder, current episode mixed, unspecified: Principal | ICD-10-CM | POA: Diagnosis present

## 2020-01-05 DIAGNOSIS — B961 Klebsiella pneumoniae [K. pneumoniae] as the cause of diseases classified elsewhere: Secondary | ICD-10-CM | POA: Diagnosis present

## 2020-01-05 DIAGNOSIS — Z83438 Family history of other disorder of lipoprotein metabolism and other lipidemia: Secondary | ICD-10-CM

## 2020-01-05 DIAGNOSIS — Z806 Family history of leukemia: Secondary | ICD-10-CM

## 2020-01-05 DIAGNOSIS — Z8673 Personal history of transient ischemic attack (TIA), and cerebral infarction without residual deficits: Secondary | ICD-10-CM

## 2020-01-05 DIAGNOSIS — Z823 Family history of stroke: Secondary | ICD-10-CM

## 2020-01-05 DIAGNOSIS — Z888 Allergy status to other drugs, medicaments and biological substances status: Secondary | ICD-10-CM

## 2020-01-05 DIAGNOSIS — E1143 Type 2 diabetes mellitus with diabetic autonomic (poly)neuropathy: Secondary | ICD-10-CM | POA: Diagnosis present

## 2020-01-05 DIAGNOSIS — M797 Fibromyalgia: Secondary | ICD-10-CM | POA: Diagnosis present

## 2020-01-05 DIAGNOSIS — K3184 Gastroparesis: Secondary | ICD-10-CM | POA: Diagnosis present

## 2020-01-05 DIAGNOSIS — K219 Gastro-esophageal reflux disease without esophagitis: Secondary | ICD-10-CM | POA: Diagnosis present

## 2020-01-05 DIAGNOSIS — F988 Other specified behavioral and emotional disorders with onset usually occurring in childhood and adolescence: Secondary | ICD-10-CM | POA: Diagnosis present

## 2020-01-05 DIAGNOSIS — Z9109 Other allergy status, other than to drugs and biological substances: Secondary | ICD-10-CM

## 2020-01-05 LAB — RESP PANEL BY RT-PCR (FLU A&B, COVID) ARPGX2
Influenza A by PCR: NEGATIVE
Influenza B by PCR: NEGATIVE
SARS Coronavirus 2 by RT PCR: NEGATIVE

## 2020-01-05 LAB — CBC
HCT: 39.5 % (ref 36.0–46.0)
Hemoglobin: 13.1 g/dL (ref 12.0–15.0)
MCH: 30 pg (ref 26.0–34.0)
MCHC: 33.2 g/dL (ref 30.0–36.0)
MCV: 90.6 fL (ref 80.0–100.0)
Platelets: 269 10*3/uL (ref 150–400)
RBC: 4.36 MIL/uL (ref 3.87–5.11)
RDW: 13.1 % (ref 11.5–15.5)
WBC: 11 10*3/uL — ABNORMAL HIGH (ref 4.0–10.5)
nRBC: 0 % (ref 0.0–0.2)

## 2020-01-05 LAB — COMPREHENSIVE METABOLIC PANEL
ALT: 28 U/L (ref 0–44)
AST: 31 U/L (ref 15–41)
Albumin: 4.7 g/dL (ref 3.5–5.0)
Alkaline Phosphatase: 45 U/L (ref 38–126)
Anion gap: 11 (ref 5–15)
BUN: 28 mg/dL — ABNORMAL HIGH (ref 6–20)
CO2: 25 mmol/L (ref 22–32)
Calcium: 9.6 mg/dL (ref 8.9–10.3)
Chloride: 104 mmol/L (ref 98–111)
Creatinine, Ser: 0.72 mg/dL (ref 0.44–1.00)
GFR, Estimated: >60 mL/min (ref >60)
Glucose, Bld: 110 mg/dL — ABNORMAL HIGH (ref 70–99)
Potassium: 3.6 mmol/L (ref 3.5–5.1)
Sodium: 140 mmol/L (ref 135–145)
Total Bilirubin: 1.1 mg/dL (ref 0.3–1.2)
Total Protein: 7.5 g/dL (ref 6.5–8.1)

## 2020-01-05 LAB — SALICYLATE LEVEL: Salicylate Lvl: <7 mg/dL — ABNORMAL LOW (ref 7.0–30.0)

## 2020-01-05 LAB — ETHANOL: Alcohol, Ethyl (B): <10 mg/dL (ref <10)

## 2020-01-05 LAB — ACETAMINOPHEN LEVEL: Acetaminophen (Tylenol), Serum: <10 ug/mL — ABNORMAL LOW (ref 10–30)

## 2020-01-05 MED ORDER — ZIPRASIDONE MESYLATE 20 MG IM SOLR
10.0000 mg | Freq: Once | INTRAMUSCULAR | Status: AC
  Administered 2020-01-05: 10 mg via INTRAMUSCULAR
  Filled 2020-01-05: qty 20

## 2020-01-05 NOTE — ED Notes (Signed)
Pt got up out of bed attempted to run towards ems door but turned around and ran into bathroom and shut door. This RN was able to enter bathroom after patient. Pt not cooperative and refusing to go back to bed. MD Siadecki entered bathroom with this RN and was able to verbally convince patient to return to bed. Patient with no logical sentences at this time. Pt wispering to MD that "jesus talked to me last night".

## 2020-01-05 NOTE — ED Triage Notes (Signed)
Pt to ED via [facility] per IVC papers pt is not performing her own ADL's at home and is now urinating on herself. Pt's mother reports that patient looks at her in threatening ways and "constantly tells her 'I love you mama'". Pt noted to be constant fidgeting in triage, constant moving of her lips. Pt currently in handcuffs and remains in custody of [facility]. Pt c/o pain to her neck, however then patient states she is unsure of where her pain is. Pt also noted to be speaking to herself in triage, pt sitting with her eyes closed, repeated states, "what's going on, I can't move, help me, help me". Pt noted to be rocking her head back and forth in triage.   Pt states to this RN in triage that she died last night. Pt states that she has died several times in the last several months.

## 2020-01-05 NOTE — ED Notes (Signed)
Pt now resting comfortably at this time. Respirations even and unlabored.

## 2020-01-05 NOTE — ED Notes (Signed)
1 pair black shoes, 1 pair white socks, 1 red jacket, 1 shiney grey ring, 1 shiney gray ring with clear stones placed in patient belongings bag. Pt dressed into blue paper top and socks by this RN and Les, EDT-P. Pt became irate halfway through getting dressed and attempted to grab bag of personal belongings and run through the door. Pt stopped by [facility] and placed back into forensic restraints by [employer].

## 2020-01-05 NOTE — ED Provider Notes (Signed)
[facility] Emergency Department Provider Note ____________________________________________   First MD Initiated Contact with Patient 01/05/20 1730     (approximate)  I have reviewed the triage vital signs and the nursing notes.   HISTORY  Chief Complaint Psychiatric Evaluation  Level 5 caveat: History of present illness limited due to disorganized historian, possible psychosis  HPI MODENE ANDY is a 49 y.o. female with PMH as noted below including a history of depression and PTSD who presents under involuntary commitment for abnormal behavior.  Per the IVC paperwork, the patient is unable to complete ADLs at home and has been urinating on herself.  She has behaved threatening way around her mother, fidgeting, rocking back and forth, and saying things that do not make sense.  Currently, the patient cannot give much history.  She initially stated to me that she did not feel well, but then could not describe what this meant or whether she was feeling unwell physically or mentally.  At one point she started talking about how Jesus came to visit her last night.  She denies using alcohol or drugs.  Past Medical History:  Diagnosis Date  . ADD (attention deficit disorder)   . Anxiety   . Arthritis   . Auditory hallucinations   . Carpal tunnel syndrome on both sides   . Cervical spine fracture ([place]) 2008   Closed with screw C2-3 [facility] Dr. Delilah Shan  . Degenerative joint disease of spine   . Depression   . Diabetes mellitus without complication ([place])   . Family history of adverse reaction to anesthesia    mom- slow to awaken  . Fibromyalgia   . Foot fracture, left   . Gastroparesis   . Gastroparesis   . GERD (gastroesophageal reflux disease)   . Hearing loss   . Herniated cervical disc   . History of prescription drug abuse   . Neuromuscular disorder (HCC)    fibromyalgia/chronic fatique syndrome  . Neuropathy of foot   . OCD (obsessive compulsive disorder)    . 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 Plaque psoriasis   . PTSD (post-traumatic stress disorder)   . Stroke ([place])   . Ulcer     Patient Active Problem List   Diagnosis Date Noted  . Type 2 diabetes mellitus without complication, without long-term current use of insulin ([place]) 11/01/2019  . Other hyperlipidemia 11/01/2019  . Acute delirium 07/05/2017  . CAP (community acquired pneumonia) 07/04/2017  . Tachycardia 07/04/2017  . Acute encephalopathy 07/04/2017  . Tobacco use 03/03/2016  . COPD exacerbation ([place]) 02/11/2016  . Obsessive-compulsive disorder 01/17/2015  . Borderline diabetes 09/04/2014  . Depression, major, recurrent, moderate ([place]) 08/21/2014  . ADD (attention deficit disorder) 07/31/2014  . Dermatitis, eczematoid 07/31/2014  . Allergy to environmental factors 07/31/2014  . Disorder of nervous system 07/31/2014  . 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 Plaque psoriasis 07/31/2014  . Cicatrix 07/31/2014  . Avitaminosis D 07/31/2014  . Loss of feeling or sensation 06/04/2014  . Auditory hallucinations   . Herniated cervical disc   . PTSD (post-traumatic stress disorder)   . Nonspecific abnormal results of endocrine function study 04/19/2014  . Carpal tunnel syndrome 10/03/2012  . Chest pain of uncertain etiology 49/67/5916  . Acid indigestion 07/21/2012  . 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 Fibromyalgia syndrome 01/22/2012  . Fibrositis 01/22/2012  . Cervical  osteoarthritis 03/10/2011    Past Surgical History:  Procedure Laterality Date  . CARPAL TUNNEL RELEASE Left 07/12/2015   Procedure: OPEN CARPAL TUNNEL RELEASE OF LEFT HAND;  Surgeon: Leanor Kail, MD;  Location: [facility];  Service: Orthopedics;  Laterality: Left;  . cervical radiofrequency neurotomy    . FRACTURE SURGERY    . MOUTH SURGERY     upper and lower dentures  . NASAL SEPTUM  SURGERY    . occipital nerve blocks    . SPINE SURGERY     cervical    Prior to Admission medications   Medication Sig Start Date End Date Taking? Authorizing Provider  ADVAIR DISKUS 250-50 MCG/DOSE AEPB TAKE 1 PUFF INTO LUNGS TWICE A DAY 01/12/17   Johnson, Megan P, DO  ascorbic acid (VITAMIN C) 1000 MG tablet Take by mouth daily. 12/25/05   [provider]  aspirin 81 MG tablet Take 81 mg by mouth daily.    [provider]  busPIRone (BUSPAR) 10 MG tablet Take 10 mg by mouth as needed. 10/17/19   [provider]  Cholecalciferol (VITAMIN D3) 1000 UNITS CAPS Take by mouth.    [provider]  Cyanocobalamin (RA VITAMIN B-12 TR) 1000 MCG TBCR Take by mouth.    [provider]  cyclobenzaprine (FLEXERIL) 10 MG tablet TAKE 1 TABLET BY MOUTH EVERY DAY AS NEEDED AND 2 TABLETS BY MOUTH EVERY EVENING AT BEDTIME. **MUST LAST 30 DAYS** 06/23/16   Johnson, Megan P, DO  diazepam (VALIUM) 10 MG tablet 3 (three) times daily.  06/22/17   [provider]  DULoxetine (CYMBALTA) 30 MG capsule Take 60 mg by mouth daily.  06/22/17   [provider]  EPINEPHrine 0.3 mg/0.3 mL IJ SOAJ injection Inject 0.3 mg into the muscle as needed for anaphylaxis. 11/22/19   Blake Divine, MD  escitalopram (LEXAPRO) 10 MG tablet Take 10 mg by mouth daily. 10/18/19   [provider]  etonogestrel (NEXPLANON) 68 MG IMPL implant 1 each (68 mg total) by Subdermal route once for 1 dose. 11/15/18 11/01/19  Will Bonnet, MD  fluticasone (FLONASE) 50 MCG/ACT nasal spray Place into both nostrils as needed. 10/31/19   [provider]  haloperidol (HALDOL) 2 MG tablet Take by mouth.    [provider]  metFORMIN (GLUCOPHAGE) 500 MG tablet Take by mouth.    [provider]  nicotine (NICODERM CQ - DOSED IN MG/24 HOURS) 21 mg/24hr patch Place 1 patch (21 mg total) onto the skin daily. Okay to substitute generic 07/06/17   Loletha Grayer, MD   pantoprazole (PROTONIX) 40 MG tablet TAKE ONE TABLET EVERY DAY 09/04/16   Park Liter P, DO  prazosin (MINIPRESS) 2 MG capsule Take 2 mg by mouth at bedtime. 09/25/19   [provider]  PROAIR HFA 108 (90 Base) MCG/ACT inhaler Inhale 2 puffs into the lungs every 6 (six) hours as needed. 10/02/19   [provider]  SM MULTIPLE VITAMINS/IRON TABS Take by mouth. 12/25/05   [provider]  VYVANSE 40 MG capsule Take 40 mg by mouth every morning. 10/25/19   [provider]  zolpidem (AMBIEN) 10 MG tablet Take 10 mg by mouth at bedtime. 10/23/19   [provider]    Allergies Doxycycline, Lyrica [pregabalin], Neurontin [gabapentin], Chantix [varenicline], Prednisone, Other, Penicillin g, Penicillins, and Sulfa antibiotics  Family History  Problem Relation Age of Onset  . Cancer Mother 57       breast cancer  . Migraines  Mother   . Hypertension Mother   . Arthritis Mother   . Sleep apnea Mother   . Anxiety disorder Mother   . Depression Mother   . Diabetes Father   . Cancer Father 30       of spine , pancreas and liver  . Anxiety disorder Father   . Depression Father   . Diabetes Brother   . Hyperlipidemia Brother   . Sleep apnea Brother   . Cancer Maternal Grandfather        leukemia, prostate  . Cancer Paternal Grandfather        bone  . Cancer Maternal Aunt        breast  . Mental illness Maternal Grandmother        Dementia  . Stroke Paternal Grandmother   . Diabetes Brother   . Cancer Paternal Aunt        breast    Social History Social History   Tobacco Use  . Smoking status: Current Every Day Smoker    Packs/day: 1.00    Years: 20.00    Pack years: 20.00    Types: Cigarettes    Start date: 03/01/2011  . Smokeless tobacco: Never Used  Vaping Use  . Vaping Use: Never used  Substance Use Topics  . Alcohol use: No  . Drug use: No    Review of Systems Level 5 caveat: Unable to obtain review of systems due to  disorganized historian, possible psychosis    ____________________________________________   PHYSICAL EXAM:  VITAL SIGNS: ED Triage Vitals  Enc Vitals Group     BP 01/05/20 1701 (!) 125/94     Pulse Rate 01/05/20 1701 (!) 120     Resp 01/05/20 1701 (!) 28     Temp 01/05/20 1701 98.5 F (36.9 C)     Temp Source 01/05/20 1701 Oral     SpO2 01/05/20 1701 96 %     Weight 01/05/20 1644 184 lb 1.4 oz (83.5 kg)     Height 01/05/20 1644 5\' 5"  (1.651 m)     Head Circumference --      Peak Flow --      Pain Score --      Pain Loc --      Pain Edu? --      Excl. in [place]? --     Constitutional: Alert, confused and anxious appearing but in no acute distress. Eyes: Conjunctivae are normal.  EOMI. Head: Atraumatic. Nose: No congestion/rhinnorhea. Mouth/Throat: Mucous membranes are moist.   Neck: Normal range of motion.  Cardiovascular: Normal rate, regular rhythm.  Good peripheral circulation. Respiratory: Normal respiratory effort.  No retractions.  Gastrointestinal: No distention.  Musculoskeletal: No lower extremity edema.  Extremities warm and well perfused.  Neurologic:  Normal speech and language.  Motor intact in all extremities.  Normal gait. Skin:  Skin is warm and dry. No rash noted. Psychiatric: Anxious appearing, rocking back and forth, responding to internal stimuli, disorganized thought.  ____________________________________________   LABS (all labs ordered are listed, but only abnormal results are displayed)  Labs Reviewed  COMPREHENSIVE METABOLIC PANEL - Abnormal; Notable for the following components:      Result Value   Glucose, Bld 110 (*)    BUN 28 (*)    All other components within normal limits  SALICYLATE LEVEL - Abnormal; Notable for the following components:   Salicylate Lvl <9.3 (*)    All other components within normal limits  ACETAMINOPHEN LEVEL - Abnormal; Notable for  the following components:   Acetaminophen (Tylenol), Serum <10 (*)    All other  components within normal limits  CBC - Abnormal; Notable for the following components:   WBC 11.0 (*)    All other components within normal limits  RESP PANEL BY RT-PCR (FLU A&B, COVID) ARPGX2  ETHANOL  URINE DRUG SCREEN, QUALITATIVE ([facility] ONLY)  POC URINE PREG, ED   ____________________________________________  EKG   ____________________________________________  RADIOLOGY    ____________________________________________   PROCEDURES  Procedure(s) performed: No  Procedures  Critical Care performed: No ____________________________________________   INITIAL IMPRESSION / ASSESSMENT AND PLAN / ED COURSE  Pertinent labs & imaging results that were available during my care of the patient were reviewed by me and considered in my medical decision making (see chart for details).  49 year old female with PMH as noted above presents under involuntary commitment with bizarre behavior.  She is unable to give much history and is clearly disorganized and responding to internal stimuli.    I reviewed the past medical records in [place].  The patient was seen in the ED about 6 weeks ago with a possible allergic reaction and seems to have been behaving appropriately at that time.  She was admitted in 2019 with pneumonia and sepsis.  She was acutely delirious from this.  She has a history of anxiety, depression, posttraumatic stress disorder OCD and ADD.  On exam currently, the vital signs are normal except for tachycardia although the patient is quite anxious appearing.  She is pacing around, rocking back and forth, talking to herself, with psychomotor agitation.  She started telling me about Jesus visiting her last night, but then did not complete her thought.  She states she does not feel well but is unable to characterize this.  Overall presentation is most concerning for psychiatric etiology of acute psychosis, versus substance abuse.  I have a lower suspicion for organic cause.  We will  obtain lab work-up for medical screening and then have psychiatry evaluate the patient.  After being put back in the room, the patient became increasingly agitated and resistant to exam or taking off her soiled pants.  She attempted to leave several times when she was uncuffed, and then was standing in the bathroom where she appeared somewhat unsteady on her feet.  I was able to calm the patient down somewhat verbally and get her to at least go back to the examination area.  However given her agitation and unsteadiness on her feet, I am concerned that she is an acute danger to self especially if she tries to leave.  We we will give a dose of IM Geodon to help calm her.  ----------------------------------------- 11:32 PM on 01/05/2020 -----------------------------------------  The patient has been sleeping comfortably for the last several hours.  She is awaiting psychiatry evaluation.  Lab work-up is unremarkable.  I signed her out to the oncoming physician Dr. Beather Arbour. ______________________  The patient has been placed in psychiatric observation due to the need to provide a safe environment for the patient while obtaining psychiatric consultation and evaluation, as well as ongoing medical and medication management to treat the patient's condition.  The patient has been placed under full IVC at this time.  ____________________________________________   FINAL CLINICAL IMPRESSION(S) / ED DIAGNOSES  Final diagnoses:  Psychosis, unspecified psychosis type ([place])      NEW MEDICATIONS STARTED DURING THIS VISIT:  New Prescriptions   No medications on file     Note:  This document was prepared  using Systems analyst and may include unintentional dictation errors.    Arta Silence, MD 01/05/20 2332

## 2020-01-05 NOTE — BH Assessment (Signed)
TTS attempted to assess patient. Patient was unwilling to cooperate. Will try again.

## 2020-01-05 NOTE — ED Notes (Signed)
PT IVC/SOC pending

## 2020-01-05 NOTE — BH Assessment (Addendum)
Patient currently unable to participate in assessment at this time, will attempt at a later time.   Update 5:45am: Patient continues to be unable to participate in assessment, TTS to follow-up

## 2020-01-06 LAB — CBG MONITORING, ED: Glucose-Capillary: 81 mg/dL (ref 70–99)

## 2020-01-06 MED ORDER — ASPIRIN EC 81 MG PO TBEC
81.0000 mg | DELAYED_RELEASE_TABLET | Freq: Every day | ORAL | Status: DC
  Administered 2020-01-07: 81 mg via ORAL
  Filled 2020-01-06 (×2): qty 1

## 2020-01-06 MED ORDER — DULOXETINE HCL 30 MG PO CPEP
30.0000 mg | ORAL_CAPSULE | Freq: Every day | ORAL | Status: DC
  Administered 2020-01-07: 30 mg via ORAL
  Filled 2020-01-06 (×2): qty 1

## 2020-01-06 MED ORDER — HALOPERIDOL 5 MG PO TABS
5.0000 mg | ORAL_TABLET | Freq: Two times a day (BID) | ORAL | Status: DC
  Administered 2020-01-07: 5 mg via ORAL
  Filled 2020-01-06: qty 1

## 2020-01-06 MED ORDER — DIAZEPAM 5 MG PO TABS: 5.0000 mg | ORAL_TABLET | Freq: Three times a day (TID) | ORAL | Status: DC | PRN

## 2020-01-06 MED ORDER — METFORMIN HCL 500 MG PO TABS
500.0000 mg | ORAL_TABLET | Freq: Every day | ORAL | Status: DC
  Administered 2020-01-07: 500 mg via ORAL
  Filled 2020-01-06: qty 1

## 2020-01-06 NOTE — ED Notes (Signed)
Dinner tray, drink provided

## 2020-01-06 NOTE — ED Notes (Signed)
Pt given blanket and Sprite.

## 2020-01-06 NOTE — BH Assessment (Signed)
Writer spoke with the patient mother for collateral information. She shared was diabetic and concerned about her medications. Writer informed the patients nurse (Amy B.).

## 2020-01-06 NOTE — Consult Note (Signed)
This provider and TTS attempted to assess the client, she would not awaken.  Continues to sleep throughout the day.  Waylan Boga, PMHNP

## 2020-01-06 NOTE — ED Notes (Signed)
Hourly rounding reveals patient in room. No complaints, stable, in no acute distress. Q15 minute rounds and monitoring via Security Cameras to continue. 

## 2020-01-06 NOTE — ED Notes (Signed)
Medication Reconciliation Report  For Home History Technicians  HIGHLIGHTS:  1. The patient WAS NOT personally interviewed 2. If not, what was the main source used: PHARMACY RECORDS 3. Does the patient appear to take any anti-coagulation agents (e.g. warfarin, Eliquis or Xarelto): NO 4. Does the patient appear to take any anti-convulsant agents (e.g. divalproex, levetiracetam or phenytoin): NO 5. Does the patient appear to use any insulin products (e.g. Lantus, Novolin or Humalog): NO 6. Does the patient appear to take any "beta-blockers" (e.g. metoprolol, carvedilol or bisoprolol: NO  BARRIERS:  1. Were there any barriers that prevented or complicated the medication reconciliation process: YES 2. If yes, what was the primary barrier encountered: Condition prevents interview 3. Does the patient appear compliant with prescribed medications: UNABLE TO DETERMINE 4. Does the patient express any barriers with compliance: UNABLE TO DETERMINE 5. What is the primary barrier the patient reports: None   NOTES:[Include any concerns, remarks or complaints the patient expresses regarding medication therapy. Any observations or other information that might be useful to the treatment team can also be included. Immediate needs or concerns should be referred to the RN or appropriate member of the treatment team.]  Patient sleeping and reportedly still foggy. Pharmacy records suggest patient has stopped ESCITALOPRAM (last refill was 10/2019 for 30d supply). Unable to ascertain last doses taken but patient has been in the emergency department for over 24 hours.   Colen Darling, CPhT [place] [facility]. [place],  [phone]  ** The above is intended solely for informational and/or communicative purposes. It should in no way be considered an endorsement of any specific treatment, therapy or action. **

## 2020-01-06 NOTE — ED Provider Notes (Signed)
Emergency Medicine Observation Re-evaluation Note  Becky Hubbard is a 49 y.o. female, seen on rounds today.  Pt initially presented to the ED for complaints of Psychiatric Evaluation Currently, the patient is resting, voices no medical complaints.  Physical Exam  BP 96/68 (BP Location: Left Arm)   Pulse 83   Temp 98.5 F (36.9 C) (Oral)   Resp 20   Ht 5\' 5"  (1.651 m)   Wt 83.5 kg   SpO2 96%   BMI 30.63 kg/m  Physical Exam General: Resting in no acute distress Cardiac: No cyanosis Lungs: Equal rise and fall Psych: Not agitated  ED Course / MDM  EKG:    I have reviewed the labs performed to date as well as medications administered while in observation.  Recent changes in the last 24 hours include no events overnight.  Plan  Current plan is for psychiatric evaluation once patient is awake and able to participate. Patient is under full IVC at this time.   Paulette Blanch, MD 01/06/20 [phone]

## 2020-01-06 NOTE — ED Notes (Signed)
Report to include Situation, Background, Assessment, and Recommendations received from Amy RN. Patient alert and oriented, warm and dry, in no acute distress. Patient denies SI, HI, AVH and pain. Patient made aware of Q15 minute rounds and security cameras for their safety. Patient instructed to come to me with needs or concerns.

## 2020-01-06 NOTE — BH Assessment (Signed)
Writer called mother ([name]) and updated her about patient's disposition for inpatient treatment.

## 2020-01-06 NOTE — ED Notes (Signed)
Pt allowed blood sugar check.  CBG was 81.  Pt sat up and drank some water before laying back down.

## 2020-01-06 NOTE — ED Notes (Signed)
Pt currently sleeping, no acute distress noted at this time. Will continue to monitor via security cameras and Q15 min rounding.

## 2020-01-06 NOTE — BH Assessment (Signed)
Assessment Note  Becky Hubbard is an 49 y.o. female who presents to the ER due to her mother having concerns about her mental and emotional state. Prior to coming to the ER she was seen at [facility], [place] walk-in Crisis unit. Due to her presentation, she was transferred to the ER. Per the report of the patient's mother ([name]), the patient began to regress several months ago but was able to manage up to this point. However, the last week, her behaviors have worsened. She's responding to internal stimuli, sleep have decrease and she's been "saying psychotic things." She shared she was talking to dead people, seeing and trying to kill demons.  Mother states she tried to communicate with her outpatient provider about her behaviors and her medications but was unsuccessful.   Prior to living with the mother, she was staying with her aunt, in the uncle's home. The uncle had passed, and they sold the home. During the process the patient started to become unstable. She was accusing the aunt of "weird things." Such as trying to kill her, doing things with the devil. After she moved in with the mother, she briefly improved but regressed more over time.  Yesterday (01/05/2020), when writer made attempts to speak with patient for the interview, she was responding to internal stimuli, faintly above a whisper. She would not make eye contact and didn't' engage. On today (01/06/2020), she was unable to participate in the interview, due to the effects of the IM medications she received.   Diagnosis: Bipolar  Past Medical History:  Past Medical History:  Diagnosis Date  . ADD (attention deficit disorder)   . Anxiety   . Arthritis   . Auditory hallucinations   . Carpal tunnel syndrome on both sides   . Cervical spine fracture ([place]) 2008   Closed with screw C2-3 [facility] Dr. Delilah Shan  . Degenerative joint disease of spine   . Depression   . Diabetes mellitus without complication ([place])   .  Family history of adverse reaction to anesthesia    mom- slow to awaken  . Fibromyalgia   . Foot fracture, left   . Gastroparesis   . Gastroparesis   . GERD (gastroesophageal reflux disease)   . Hearing loss   . Herniated cervical disc   . History of prescription drug abuse   . Neuromuscular disorder (HCC)    fibromyalgia/chronic fatique syndrome  . Neuropathy of foot   . OCD (obsessive compulsive disorder)   . 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 Plaque psoriasis   . PTSD (post-traumatic stress disorder)   . Stroke ([place])   . Ulcer     Past Surgical History:  Procedure Laterality Date  . CARPAL TUNNEL RELEASE Left 07/12/2015   Procedure: OPEN CARPAL TUNNEL RELEASE OF LEFT HAND;  Surgeon: Leanor Kail, MD;  Location: [place];  Service: Orthopedics;  Laterality: Left;  . cervical radiofrequency neurotomy    . FRACTURE SURGERY    . MOUTH SURGERY     upper and lower dentures  . NASAL SEPTUM SURGERY    . occipital nerve blocks    . SPINE SURGERY     cervical    Family History:  Family History  Problem Relation Age of Onset  . Cancer Mother 84       breast cancer  . Migraines Mother   .  Hypertension Mother   . Arthritis Mother   . Sleep apnea Mother   . Anxiety disorder Mother   . Depression Mother   . Diabetes Father   . Cancer Father 57       of spine , pancreas and liver  . Anxiety disorder Father   . Depression Father   . Diabetes Brother   . Hyperlipidemia Brother   . Sleep apnea Brother   . Cancer Maternal Grandfather        leukemia, prostate  . Cancer Paternal Grandfather        bone  . Cancer Maternal Aunt        breast  . Mental illness Maternal Grandmother        Dementia  . Stroke Paternal Grandmother   . Diabetes Brother   . Cancer Paternal Aunt        breast    Social History:  reports that she has been smoking  cigarettes. She started smoking about 8 years ago. She has a 20.00 pack-year smoking history. She has never used smokeless tobacco. She reports that she does not drink alcohol and does not use drugs.  Additional Social History:  Alcohol / Drug Use Pain Medications: See PTA Prescriptions: See PTA Over the Counter: See PTA History of alcohol / drug use?: No history of alcohol / drug abuse Longest period of sobriety (when/how long): Unknown  CIWA: CIWA-Ar BP: 114/61 Pulse Rate: 93 COWS:    Allergies:  Allergies  Allergen Reactions  . Doxycycline   . Lyrica [Pregabalin] Swelling    Ankles swell and turn red  . Neurontin [Gabapentin]   . Chantix [Varenicline] Rash  . Prednisone Other (See Comments)  . Other Other (See Comments)    Cats - cough/sneezing  . Penicillin G Hives  . Penicillins Hives    Has patient had a PCN reaction causing immediate rash, facial/tongue/throat swelling, SOB or lightheadedness with hypotension: No Has patient had a PCN reaction causing severe rash involving mucus membranes or skin necrosis: No Has patient had a PCN reaction that required hospitalization No Has patient had a PCN reaction occurring within the last 10 years: No If all of the above answers are "NO", then may proceed with Cephalosporin use.   . Sulfa Antibiotics Hives, Rash and Other (See Comments)    Home Medications: (Not in a hospital admission)   OB/GYN Status:  No LMP recorded.  General Assessment Data Location of Assessment: [facility] ED TTS Assessment: In system Is this a Tele or Face-to-Face Assessment?: Face-to-Face Is this an Initial Assessment or a Re-assessment for this encounter?: Initial Assessment Patient Accompanied by:: N/A Language Other than English: No Living Arrangements: Other (Comment) (Private Home) What gender do you identify as?: Female Date Telepsych consult ordered in [facility]: 01/05/20 Time Telepsych consult ordered in [facility]: [place] Marital status: Single Pregnancy  Status: No Living Arrangements: Parent Can pt return to current living arrangement?: Yes Admission Status: Involuntary Petitioner: Other Financial planner [facility])) Is patient capable of signing voluntary admission?: No (Under IVC) Referral Source: Self/Family/Friend Insurance type: Medicaid  Medical Screening Exam ([place]) Medical Exam completed: Yes  Crisis Care Plan Living Arrangements: Parent Legal Guardian: Other: (Self) Name of Psychiatrist: Dr. Barbette Reichmann  Su [facility]) Name of Therapist: Reports of none  Education Status Is patient currently in school?: No Is the patient employed, unemployed or receiving disability?: Unemployed  Risk to self with the past 6 months Suicidal Ideation: No Has patient been a  risk to self within the past 6 months prior to admission? : No Suicidal Intent: No Has patient had any suicidal intent within the past 6 months prior to admission? : No Is patient at risk for suicide?: No Suicidal Plan?: No-Not Currently/Within Last 6 Months Has patient had any suicidal plan within the past 6 months prior to admission? : No Access to Means: No What has been your use of drugs/alcohol within the last 12 months?: None reported Previous Attempts/Gestures: No How many times?: 0 Other Self Harm Risks: Reports of none Triggers for Past Attempts: Unknown Intentional Self Injurious Behavior: None Family Suicide History: Unknown Recent stressful life event(s): Other (Comment), Loss (Comment), Conflict (Comment) Persecutory voices/beliefs?: No Depression: Yes Depression Symptoms: Insomnia, Tearfulness, Isolating, Fatigue, Guilt, Loss of interest in usual pleasures, Feeling worthless/self pity Substance abuse history and/or treatment for substance abuse?: No Suicide prevention information given to non-admitted patients: Not applicable  Risk to Others within the past 6 months Homicidal Ideation: No Does patient have any  lifetime risk of violence toward others beyond the six months prior to admission? : No Thoughts of Harm to Others: No Current Homicidal Intent: No Current Homicidal Plan: No Access to Homicidal Means: No Identified Victim: Reports of none History of harm to others?: No Assessment of Violence: None Noted Violent Behavior Description: Reports of none Does patient have access to weapons?: No Criminal Charges Pending?: No Does patient have a court date: No Is patient on probation?: No  Psychosis Hallucinations: Auditory, Visual Delusions: Unspecified (Religious)  Mental Status Report Appearance/Hygiene: Unremarkable, In scrubs Eye Contact: Poor Motor Activity: Freedom of movement, Unremarkable Speech: Unable to assess Level of Consciousness: Unable to assess Mood: Other (Comment) Affect: Unable to Assess Anxiety Level:  (UTA) Thought Processes: Unable to Assess Judgement: Unable to Assess Orientation: Unable to assess Obsessive Compulsive Thoughts/Behaviors: Unable to Assess  Cognitive Functioning Memory: Unable to Assess Is patient IDD: No Insight: Unable to Assess Impulse Control: Unable to Assess Appetite:  (UTA) Have you had any weight changes? : No Change Sleep: Unable to Assess Total Hours of Sleep:  (UTA) Vegetative Symptoms: Unable to Assess  ADLScreening [facility] Assessment Services) Patient's cognitive ability adequate to safely complete daily activities?: Yes Patient able to express need for assistance with ADLs?: Yes Independently performs ADLs?: Yes (appropriate for developmental age)  Prior Inpatient Therapy Prior Inpatient Therapy: No (UTA)  Prior Outpatient Therapy Prior Outpatient Therapy: Yes Prior Therapy Dates: Current Prior Therapy Facilty/Provider(s): [facility] Reason for Treatment: Medication Management Does patient have an ACCT team?: No Does patient have Intensive In-House Services?  : No Does patient have [facility] services? :  No Does patient have P4CC services?: No  ADL Screening (condition at time of admission) Patient's cognitive ability adequate to safely complete daily activities?: Yes Is the patient deaf or have difficulty hearing?: No Does the patient have difficulty seeing, even when wearing glasses/contacts?: No Does the patient have difficulty concentrating, remembering, or making decisions?: No Patient able to express need for assistance with ADLs?: Yes Does the patient have difficulty dressing or bathing?: No Independently performs ADLs?: Yes (appropriate for developmental age) Does the patient have difficulty walking or climbing stairs?: No Weakness of Legs: None Weakness of Arms/Hands: None  Home Assistive Devices/Equipment Home Assistive Devices/Equipment: None  Therapy Consults (therapy consults require a physician order) PT Evaluation Needed: No OT Evalulation Needed: No SLP Evaluation Needed: No Abuse/Neglect Assessment (Assessment to be complete while patient is alone) Abuse/Neglect Assessment Can Be Completed: Yes Physical  Abuse: Denies Verbal Abuse: Denies Sexual Abuse: Denies Exploitation of patient/patient's resources: Denies Self-Neglect: Denies Values / Beliefs Cultural Requests During Hospitalization: None Spiritual Requests During Hospitalization: None Consults Spiritual Care Consult Needed: No Transition of Care Team Consult Needed: No Advance Directives (For Healthcare) Does Patient Have a Medical Advance Directive?: No  Disposition:  Disposition Initial Assessment Completed for this Encounter: Yes  On Site Evaluation by:   Reviewed with Physician:    Gunnar Fusi MS, LCAS, [facility], [place] Therapeutic Triage Specialist 01/06/2020 5:21 PM

## 2020-01-06 NOTE — ED Notes (Signed)
Pt refused medications.

## 2020-01-06 NOTE — ED Notes (Signed)
Pt sleeping soundly. VS will be taken when pt is awake.

## 2020-01-07 ENCOUNTER — Encounter: Payer: Self-pay | Admitting: Family Medicine

## 2020-01-07 ENCOUNTER — Other Ambulatory Visit: Payer: Self-pay

## 2020-01-07 ENCOUNTER — Inpatient Hospital Stay
Admission: RE | Admit: 2020-01-07 | Discharge: 2020-01-19 | DRG: 885 | Disposition: A | Discharge status: Home / Self Care | Payer: Medicaid Other | Source: Intra-hospital | Attending: Behavioral Health | Admitting: Behavioral Health

## 2020-01-07 DIAGNOSIS — R440 Auditory hallucinations: Secondary | ICD-10-CM | POA: Diagnosis present

## 2020-01-07 DIAGNOSIS — Z7984 Long term (current) use of oral hypoglycemic drugs: Secondary | ICD-10-CM | POA: Diagnosis not present

## 2020-01-07 DIAGNOSIS — E1143 Type 2 diabetes mellitus with diabetic autonomic (poly)neuropathy: Secondary | ICD-10-CM | POA: Diagnosis present

## 2020-01-07 DIAGNOSIS — E1141 Type 2 diabetes mellitus with diabetic mononeuropathy: Secondary | ICD-10-CM | POA: Diagnosis present

## 2020-01-07 DIAGNOSIS — F333 Major depressive disorder, recurrent, severe with psychotic symptoms: Secondary | ICD-10-CM | POA: Diagnosis present

## 2020-01-07 DIAGNOSIS — M797 Fibromyalgia: Secondary | ICD-10-CM | POA: Diagnosis present

## 2020-01-07 DIAGNOSIS — Z9109 Other allergy status, other than to drugs and biological substances: Secondary | ICD-10-CM | POA: Diagnosis not present

## 2020-01-07 DIAGNOSIS — N390 Urinary tract infection, site not specified: Secondary | ICD-10-CM | POA: Diagnosis present

## 2020-01-07 DIAGNOSIS — F4310 Post-traumatic stress disorder, unspecified: Secondary | ICD-10-CM | POA: Diagnosis present

## 2020-01-07 DIAGNOSIS — Z91048 Other nonmedicinal substance allergy status: Secondary | ICD-10-CM | POA: Diagnosis not present

## 2020-01-07 DIAGNOSIS — F32A Depression, unspecified: Secondary | ICD-10-CM | POA: Diagnosis present

## 2020-01-07 DIAGNOSIS — Z888 Allergy status to other drugs, medicaments and biological substances status: Secondary | ICD-10-CM | POA: Diagnosis not present

## 2020-01-07 DIAGNOSIS — F17210 Nicotine dependence, cigarettes, uncomplicated: Secondary | ICD-10-CM | POA: Diagnosis present

## 2020-01-07 DIAGNOSIS — F3160 Bipolar disorder, current episode mixed, unspecified: Secondary | ICD-10-CM | POA: Diagnosis present

## 2020-01-07 DIAGNOSIS — Z7982 Long term (current) use of aspirin: Secondary | ICD-10-CM | POA: Diagnosis not present

## 2020-01-07 DIAGNOSIS — K3184 Gastroparesis: Secondary | ICD-10-CM | POA: Diagnosis present

## 2020-01-07 DIAGNOSIS — B961 Klebsiella pneumoniae [K. pneumoniae] as the cause of diseases classified elsewhere: Secondary | ICD-10-CM | POA: Diagnosis present

## 2020-01-07 DIAGNOSIS — F988 Other specified behavioral and emotional disorders with onset usually occurring in childhood and adolescence: Secondary | ICD-10-CM | POA: Diagnosis present

## 2020-01-07 DIAGNOSIS — Z881 Allergy status to other antibiotic agents status: Secondary | ICD-10-CM | POA: Diagnosis not present

## 2020-01-07 DIAGNOSIS — R45851 Suicidal ideations: Secondary | ICD-10-CM | POA: Diagnosis present

## 2020-01-07 DIAGNOSIS — F429 Obsessive-compulsive disorder, unspecified: Secondary | ICD-10-CM | POA: Diagnosis present

## 2020-01-07 DIAGNOSIS — Z882 Allergy status to sulfonamides status: Secondary | ICD-10-CM | POA: Diagnosis not present

## 2020-01-07 DIAGNOSIS — G5790 Unspecified mononeuropathy of unspecified lower limb: Secondary | ICD-10-CM | POA: Diagnosis present

## 2020-01-07 DIAGNOSIS — Z8673 Personal history of transient ischemic attack (TIA), and cerebral infarction without residual deficits: Secondary | ICD-10-CM | POA: Diagnosis not present

## 2020-01-07 DIAGNOSIS — Z79899 Other long term (current) drug therapy: Secondary | ICD-10-CM | POA: Diagnosis not present

## 2020-01-07 DIAGNOSIS — R7303 Prediabetes: Secondary | ICD-10-CM | POA: Diagnosis present

## 2020-01-07 DIAGNOSIS — Z880 Allergy status to penicillin: Secondary | ICD-10-CM | POA: Diagnosis not present

## 2020-01-07 DIAGNOSIS — K219 Gastro-esophageal reflux disease without esophagitis: Secondary | ICD-10-CM | POA: Diagnosis present

## 2020-01-07 LAB — CBG MONITORING, ED: Glucose-Capillary: 98 mg/dL (ref 70–99)

## 2020-01-07 MED ORDER — LISDEXAMFETAMINE DIMESYLATE 20 MG PO CAPS
40.0000 mg | ORAL_CAPSULE | Freq: Every day | ORAL | Status: DC
  Administered 2020-01-08 – 2020-01-19 (×12): 40 mg via ORAL
  Filled 2020-01-07 (×16): qty 2

## 2020-01-07 MED ORDER — DULOXETINE HCL 30 MG PO CPEP
30.0000 mg | ORAL_CAPSULE | Freq: Every day | ORAL | Status: DC
  Administered 2020-01-08 – 2020-01-10 (×3): 30 mg via ORAL
  Filled 2020-01-07 (×3): qty 1

## 2020-01-07 MED ORDER — ALUM & MAG HYDROXIDE-SIMETH 200-200-20 MG/5ML PO SUSP: 30.0000 mL | ORAL | Status: DC | PRN

## 2020-01-07 MED ORDER — METFORMIN HCL 500 MG PO TABS
500.0000 mg | ORAL_TABLET | Freq: Every day | ORAL | Status: DC
  Administered 2020-01-08 – 2020-01-19 (×12): 500 mg via ORAL
  Filled 2020-01-07 (×12): qty 1

## 2020-01-07 MED ORDER — HALOPERIDOL 0.5 MG PO TABS: 2.0000 mg | ORAL_TABLET | Freq: Two times a day (BID) | ORAL | Status: DC

## 2020-01-07 MED ORDER — HALOPERIDOL 1 MG PO TABS
2.0000 mg | ORAL_TABLET | Freq: Two times a day (BID) | ORAL | Status: DC
  Administered 2020-01-08 – 2020-01-15 (×15): 2 mg via ORAL
  Filled 2020-01-07 (×15): qty 2

## 2020-01-07 MED ORDER — DIAZEPAM 5 MG PO TABS
5.0000 mg | ORAL_TABLET | Freq: Three times a day (TID) | ORAL | Status: DC | PRN
  Administered 2020-01-07 – 2020-01-08 (×2): 5 mg via ORAL
  Filled 2020-01-07 (×2): qty 1

## 2020-01-07 MED ORDER — ACETAMINOPHEN 325 MG PO TABS
650.0000 mg | ORAL_TABLET | Freq: Four times a day (QID) | ORAL | Status: DC | PRN
  Administered 2020-01-08 – 2020-01-19 (×10): 650 mg via ORAL
  Filled 2020-01-07 (×10): qty 2

## 2020-01-07 MED ORDER — HALOPERIDOL LACTATE 5 MG/ML IJ SOLN
INTRAMUSCULAR | Status: AC
  Filled 2020-01-07: qty 1

## 2020-01-07 MED ORDER — ASPIRIN EC 81 MG PO TBEC
81.0000 mg | DELAYED_RELEASE_TABLET | Freq: Every day | ORAL | Status: DC
  Administered 2020-01-08 – 2020-01-19 (×12): 81 mg via ORAL
  Filled 2020-01-07 (×12): qty 1

## 2020-01-07 MED ORDER — LISDEXAMFETAMINE DIMESYLATE 20 MG PO CAPS
40.0000 mg | ORAL_CAPSULE | Freq: Every day | ORAL | Status: DC
  Administered 2020-01-07: 40 mg via ORAL
  Filled 2020-01-07: qty 2

## 2020-01-07 NOTE — ED Notes (Signed)
Hourly rounding reveals patient in room. No complaints, stable, in no acute distress. Q15 minute rounds and monitoring via Security Cameras to continue. 

## 2020-01-07 NOTE — ED Notes (Signed)
Obtained VS on pt.

## 2020-01-07 NOTE — ED Notes (Signed)
Patient continues to sleep, lunch brought to her room =, patient made aware lunch was on her chair in the room.

## 2020-01-07 NOTE — ED Notes (Signed)
Pt given meal tray.

## 2020-01-07 NOTE — BH Assessment (Addendum)
Patient can come down after 8pm  Call to give report: [phone]  Patient is to be admitted to [facility] by Dr. Domingo Cocking.  Attending Physician will be. Dr. Domingo Cocking.   Patient has been assigned to room 324, by [place] Nurse Demetria, RN.   Intake Paper Work has been signed and placed on patient chart.  ER staff is aware of the admission: 1. Edd Arbour, ER Secretary  2. [place], ER MD  3. Tomasa Hosteller, Patient's Nurse  4. Loma Sousa, Patient Access.   Per pt's POA request, psych made contact with Britt Boozer Alvira Philips [phone]) and provided an update on pt's acceptance to [facility] BMU.

## 2020-01-07 NOTE — BH Assessment (Signed)
Patient is under review with [facility]

## 2020-01-07 NOTE — Tx Team (Signed)
Initial Treatment Plan 01/07/2020 10:55 PM HARUYE LAINEZ FUX:[id number]    PATIENT STRESSORS: Health problems   PATIENT STRENGTHS: Supportive family/friends   PATIENT IDENTIFIED PROBLEMS:     psychosis                 DISCHARGE CRITERIA:  Ability to meet basic life and health needs Adequate post-discharge living arrangements Improved stabilization in mood, thinking, and/or behavior Medical problems require only outpatient monitoring Motivation to continue treatment in a less acute level of care Need for constant or close observation no longer present Reduction of life-threatening or endangering symptoms to within safe limits Safe-care adequate arrangements made Verbal commitment to aftercare and medication compliance  PRELIMINARY DISCHARGE PLAN: Outpatient therapy Return to previous living arrangement  PATIENT/FAMILY INVOLVEMENT: This treatment plan has been presented to and reviewed with the patient, DRUCELLA KARBOWSKI, and/or family member.  The patient and family have been given the opportunity to ask questions and make suggestions.  Libby Maw, RN 01/07/2020, 10:55 PM
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
## 2020-01-07 NOTE — ED Provider Notes (Signed)
Emergency Medicine Observation Re-evaluation Note  Becky Hubbard is a 49 y.o. female, seen on rounds today.  Pt initially presented to the ED for complaints of Psychiatric Evaluation Currently, the patient is sleeping in bed, denies complaints when woken.  Physical Exam  BP 106/69 (BP Location: Right Arm)   Pulse 88   Temp 98.5 F (36.9 C) (Oral)   Resp 18   Ht 5\' 5"  (1.651 m)   Wt 83.5 kg   SpO2 94%   BMI 30.63 kg/m  Physical Exam Constitutional: Resting comfortably. Eyes: Conjunctivae are normal. Head: Atraumatic. Nose: No congestion/rhinnorhea. Mouth/Throat: Mucous membranes are moist. Neck: Normal ROM Cardiovascular: No cyanosis noted. Respiratory: Normal respiratory effort. Gastrointestinal: Non-distended. Genitourinary: deferred Musculoskeletal: No lower extremity tenderness nor edema. Neurologic:  Normal speech and language. No gross focal neurologic deficits are appreciated. Skin:  Skin is warm, dry and intact. No rash noted.    ED Course / MDM  EKG:    I have reviewed the labs performed to date as well as medications administered while in observation.  Recent changes in the last 24 hours include none.  Plan  Current plan is for further psychiatric evaluation and disposition. Patient is under full IVC at this time.   Blake Divine, MD 01/07/20 [phone]

## 2020-01-07 NOTE — ED Notes (Signed)
Pt sleeping in room, no distress noted at this time.

## 2020-01-07 NOTE — ED Notes (Signed)
Patient is IVC  and is currently under review with  [facility] pending today and [facility] discharges

## 2020-01-07 NOTE — ED Notes (Signed)
Patient up out of bed to nursing station requesting something to eat. All scheduled meds obtained and when writer walks into room, ppatient laughing and talking to someone in the room, when asked whats making her laugh she answered they are tickling me, then patien begins to talk to herself some more.

## 2020-01-07 NOTE — ED Notes (Signed)
VS not taken, Patient asleep.
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
## 2020-01-07 NOTE — ED Notes (Addendum)
Patient continues to sleep. Moans to name, states she will get up later.

## 2020-01-07 NOTE — ED Notes (Signed)
Assumed care of patient, patient sleeping comfortably. Will re-assess upon awakening. Safety mainatained will continue to monitor.

## 2020-01-07 NOTE — BH Assessment (Signed)
Pt is currently under review with [facility] BMU pending today and tomorrow discharges.

## 2020-01-07 NOTE — Plan of Care (Signed)
  Problem: Education: Goal: Knowledge of [facility] Education information/materials will improve Outcome: Not Progressing Goal: Emotional status will improve Outcome: Not Progressing Goal: Mental status will improve Outcome: Not Progressing Goal: Verbalization of understanding the information provided will improve Outcome: Not Progressing   Problem: Activity: Goal: Interest or engagement in activities will improve Outcome: Not Progressing Goal: Sleeping patterns will improve Outcome: Not Progressing   Problem: Coping: Goal: Ability to verbalize frustrations and anger appropriately will improve Outcome: Not Progressing Goal: Ability to demonstrate self-control will improve Outcome: Not Progressing   Problem: Health Behavior/Discharge Planning: Goal: Identification of resources available to assist in meeting health care needs will improve Outcome: Not Progressing Goal: Compliance with treatment plan for underlying cause of condition will improve Outcome: Not Progressing   Problem: Physical Regulation: Goal: Ability to maintain clinical measurements within normal limits will improve Outcome: Not Progressing   Problem: Safety: Goal: Periods of time without injury will increase Outcome: Not Progressing   Problem: Activity: Goal: Will verbalize the importance of balancing activity with adequate rest periods Outcome: Not Progressing   Problem: Education: Goal: Will be free of psychotic symptoms Outcome: Not Progressing Goal: Knowledge of the prescribed therapeutic regimen will improve Outcome: Not Progressing   Problem: Coping: Goal: Coping ability will improve Outcome: Not Progressing Goal: Will verbalize feelings Outcome: Not Progressing   Problem: Health Behavior/Discharge Planning: Goal: Compliance with prescribed medication regimen will improve Outcome: Not Progressing   Problem: Nutritional: Goal: Ability to achieve adequate nutritional intake will  improve Outcome: Not Progressing   Problem: Role Relationship: Goal: Ability to communicate needs accurately will improve Outcome: Not Progressing Goal: Ability to interact with others will improve Outcome: Not Progressing   Problem: Safety: Goal: Ability to redirect hostility and anger into socially appropriate behaviors will improve Outcome: Not Progressing Goal: Ability to remain free from injury will improve Outcome: Not Progressing   Problem: Self-Care: Goal: Ability to participate in self-care as condition permits will improve Outcome: Not Progressing   Problem: Self-Concept: Goal: Will verbalize positive feelings about self Outcome: Not Progressing

## 2020-01-07 NOTE — BH Assessment (Signed)
Patient unable to be admitted to [facility] due to staffing issues  Patient to be referred to other facilities by TTS.

## 2020-01-07 NOTE — ED Notes (Signed)
Patient continues to sleep, arousable to name.

## 2020-01-08 DIAGNOSIS — F333 Major depressive disorder, recurrent, severe with psychotic symptoms: Secondary | ICD-10-CM

## 2020-01-08 DIAGNOSIS — G5793 Unspecified mononeuropathy of bilateral lower limbs: Secondary | ICD-10-CM | POA: Insufficient documentation

## 2020-01-08 LAB — GLUCOSE, CAPILLARY: Glucose-Capillary: 130 mg/dL — ABNORMAL HIGH (ref 70–99)

## 2020-01-08 NOTE — Progress Notes (Signed)
This Probation officer was notified that patient wanted her blood sugar checked and she felt like she needed a cookie. This writer went to check patient's CBG, and it was 130.
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
## 2020-01-08 NOTE — Progress Notes (Signed)
Recreation Therapy Notes  Date: 01/08/2020  Time: 9:30 am   Location: Craft room     Behavioral response: N/A   Intervention Topic: Self-care   Discussion/Intervention: Patient did not attend group.   Clinical Observations/Feedback:  Patient did not attend group.   Sinda Leedom LRT/CTRS        Kyli Sorter 01/08/2020 11:54 AM
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
## 2020-01-08 NOTE — Tx Team (Signed)
Interdisciplinary Treatment and Diagnostic Plan Update  01/08/2020 Time of Session: 9:00AM Becky Hubbard MRN: [id number]  Principal Diagnosis: <principal problem not specified>  Secondary Diagnoses: Active Problems:   Major depressive disorder, recurrent episode, severe, with psychosis ([place])   Current Medications:  Current Facility-Administered Medications  Medication Dose Route Frequency Provider Last Rate Last Admin  . acetaminophen (TYLENOL) tablet 650 mg  650 mg Oral Q6H PRN Patrecia Pour, NP   650 mg at 01/08/20 0921  . alum & mag hydroxide-simeth (MAALOX/MYLANTA) 200-200-20 MG/5ML suspension 30 mL  30 mL Oral Q4H PRN Patrecia Pour, NP      . aspirin EC tablet 81 mg  81 mg Oral Daily Patrecia Pour, NP   81 mg at 01/08/20 0920  . diazepam (VALIUM) tablet 5 mg  5 mg Oral TID PRN Patrecia Pour, NP   5 mg at 01/07/20 2356  . DULoxetine (CYMBALTA) DR capsule 30 mg  30 mg Oral Daily Patrecia Pour, NP   30 mg at 01/08/20 0920  . haloperidol (HALDOL) tablet 2 mg  2 mg Oral BID Patrecia Pour, NP   2 mg at 01/08/20 0920  . lisdexamfetamine (VYVANSE) capsule 40 mg  40 mg Oral Daily Patrecia Pour, NP   40 mg at 01/08/20 0920  . metFORMIN (GLUCOPHAGE) tablet 500 mg  500 mg Oral Q breakfast Patrecia Pour, NP   500 mg at 01/08/20 0920   PTA Medications: Medications Prior to Admission  Medication Sig Dispense Refill Last Dose  . aspirin 81 MG tablet Take 81 mg by mouth daily.     . busPIRone (BUSPAR) 15 MG tablet Take 15 mg by mouth 2 (two) times daily.      . cyclobenzaprine (FLEXERIL) 10 MG tablet Take 5-10 mg by mouth at bedtime as needed for muscle spasms.     . diazepam (VALIUM) 10 MG tablet Take 10 mg by mouth 3 (three) times daily as needed for anxiety.   1   . DULoxetine (CYMBALTA) 60 MG capsule Take 60 mg by mouth 2 (two) times daily.   4   . EPINEPHrine 0.3 mg/0.3 mL IJ SOAJ injection Inject 0.3 mg into the muscle as needed for anaphylaxis. 1 each 0   . etonogestrel  (NEXPLANON) 68 MG IMPL implant 1 each (68 mg total) by Subdermal route once for 1 dose. 1 each 0   . fluticasone (FLONASE) 50 MCG/ACT nasal spray Place 2 sprays into both nostrils daily.      . haloperidol (HALDOL) 2 MG tablet Take 2 mg by mouth at bedtime.      . metFORMIN (GLUCOPHAGE) 500 MG tablet Take 500 mg by mouth daily.      . pantoprazole (PROTONIX) 40 MG tablet TAKE ONE TABLET EVERY DAY (Patient taking differently: Take 40 mg by mouth daily. ) 30 tablet 3   . prazosin (MINIPRESS) 2 MG capsule Take 2 mg by mouth at bedtime.     Marland Kitchen PROAIR HFA 108 (90 Base) MCG/ACT inhaler Inhale 2 puffs into the lungs every 6 (six) hours as needed for wheezing or shortness of breath.      Marland Kitchen VYVANSE 40 MG capsule Take 40 mg by mouth every morning.     . ziprasidone (GEODON) 40 MG capsule Take 40 mg by mouth daily with supper.     . zolpidem (AMBIEN) 10 MG tablet Take 10 mg by mouth at bedtime.       Patient Stressors: Health problems  Patient Strengths: Supportive family/friends  Treatment Modalities: Medication Management, Group therapy, Case management,  1 to 1 session with clinician, Psychoeducation, Recreational therapy.   Physician Treatment Plan for Primary Diagnosis: <principal problem not specified> Long Term Goal(s):     Short Term Goals:    Medication Management: Evaluate patient's response, side effects, and tolerance of medication regimen.  Therapeutic Interventions: 1 to 1 sessions, Unit Group sessions and Medication administration.  Evaluation of Outcomes: Not Met  Physician Treatment Plan for Secondary Diagnosis: Active Problems:   Major depressive disorder, recurrent episode, severe, with psychosis ([place])  Long Term Goal(s):     Short Term Goals:       Medication Management: Evaluate patient's response, side effects, and tolerance of medication regimen.  Therapeutic Interventions: 1 to 1 sessions, Unit Group sessions and Medication administration.  Evaluation of  Outcomes: Not Met   RN Treatment Plan for Primary Diagnosis: <principal problem not specified> Long Term Goal(s): Knowledge of disease and therapeutic regimen to maintain health will improve  Short Term Goals: Ability to demonstrate self-control, Ability to participate in decision making will improve, Ability to verbalize feelings will improve, Ability to identify and develop effective coping behaviors will improve and Compliance with prescribed medications will improve  Medication Management: RN will administer medications as ordered by provider, will assess and evaluate patient's response and provide education to patient for prescribed medication. RN will report any adverse and/or side effects to prescribing provider.  Therapeutic Interventions: 1 on 1 counseling sessions, Psychoeducation, Medication administration, Evaluate responses to treatment, Monitor vital signs and CBGs as ordered, Perform/monitor CIWA, COWS, AIMS and Fall Risk screenings as ordered, Perform wound care treatments as ordered.  Evaluation of Outcomes: Not Met   LCSW Treatment Plan for Primary Diagnosis: <principal problem not specified> Long Term Goal(s): Safe transition to appropriate next level of care at discharge, Engage patient in therapeutic group addressing interpersonal concerns.  Short Term Goals: Engage patient in aftercare planning with referrals and resources, Increase social support, Increase ability to appropriately verbalize feelings, Increase emotional regulation, Facilitate acceptance of mental health diagnosis and concerns, Identify triggers associated with mental health/substance abuse issues and Increase skills for wellness and recovery  Therapeutic Interventions: Assess for all discharge needs, 1 to 1 time with Social worker, Explore available resources and support systems, Assess for adequacy in community support network, Educate family and significant other(s) on suicide prevention, Complete  Psychosocial Assessment, Interpersonal group therapy.  Evaluation of Outcomes: Not Met   Progress in Treatment: Attending groups: No. Participating in groups: No. Taking medication as prescribed: Yes. Toleration medication: Yes. Family/Significant other contact made: No, will contact:  when given permission.  Patient understands diagnosis: No. Discussing patient identified problems/goals with staff: No. Medical problems stabilized or resolved: Yes. Denies suicidal/homicidal ideation: Yes. Issues/concerns per patient self-inventory: No. Other: None.  New problem(s) identified: No, Describe:  none.  New Short Term/Long Term Goal(s): elimination of symptoms of psychosis, medication management for mood stabilization; elimination of SI thoughts; development of comprehensive mental wellness plan.  Patient Goals: "I want to go home."   Discharge Plan or Barriers: CSW will assist pt with development of aftercare plan.  Reason for Continuation of Hospitalization: Delusions  Medication stabilization  Estimated Length of Stay: 1-7 days  Attendees: Patient: Becky Hubbard 01/08/2020 10:12 AM  Physician: Selina Cooley, MD 01/08/2020 10:12 AM  Nursing: Lyda Kalata, RN 01/08/2020 10:12 AM  RN Care Manager: 01/08/2020 10:12 AM  Social Worker: Assunta Curtis, MSW, LCSW 01/08/2020 10:12 AM  Recreational Therapist: Devin Going, LRT 01/08/2020 10:12 AM  Other: Paulla Dolly, Latanya Presser, MSW  01/08/2020 10:12 AM  Other: Chalmers Guest. Guerry Bruin, MSW, [place], [place] 01/08/2020 10:12 AM  Other: 01/08/2020 10:12 AM    Scribe for Treatment Team: Shirl Harris, LCSW 01/08/2020 10:12 AM

## 2020-01-08 NOTE — [facility] Group Notes (Signed)
LCSW Group Therapy Note   01/08/2020 2:18 PM  Type of Therapy and Topic:  Group Therapy:  Overcoming Obstacles   Participation Level:  Did Not Attend   Description of Group:    In this group patients will be encouraged to explore what they see as obstacles to their own wellness and recovery. They will be guided to discuss their thoughts, feelings, and behaviors related to these obstacles. The group will process together ways to cope with barriers, with attention given to specific choices patients can make. Each patient will be challenged to identify changes they are motivated to make in order to overcome their obstacles. This group will be process-oriented, with patients participating in exploration of their own experiences as well as giving and receiving support and challenge from other group members.   Therapeutic Goals: 1. Patient will identify personal and current obstacles as they relate to admission. 2. Patient will identify barriers that currently interfere with their wellness or overcoming obstacles.  3. Patient will identify feelings, thought process and behaviors related to these barriers. 4. Patient will identify two changes they are willing to make to overcome these obstacles:      Summary of Patient Progress   X    Therapeutic Modalities:   Cognitive Behavioral Therapy Solution Focused Therapy Motivational Interviewing Relapse Prevention Therapy  Assunta Curtis, MSW, LCSW 01/08/2020 2:18 PM

## 2020-01-08 NOTE — Plan of Care (Signed)
D- Patient alert and oriented to person. Patient presented in a pleasant mood on assessment stating that she slept ok last night and had some complaints of back pain that radiates to her bilateral legs. Patient did request PRN medication from this writer. Patient denied anxiety, however, she endorsed depression stating "I miss home". Patient also denied SI, HI, AVH at this time. Patient had no stated goals for today, however, during treatment team, she stated that she was the [facility] and that she wanted to go home.  A- Scheduled medications administered to patient, per MD orders. Support and encouragement provided.  Routine safety checks conducted every 15 minutes.  Patient informed to notify staff with problems or concerns.  R- No adverse drug reactions noted. Patient contracts for safety at this time. Patient compliant with medications and treatment plan. Patient receptive, calm, and cooperative. Patient remains safe at this time.  Problem: Education: Goal: Knowledge of [place] [facility] Education information/materials will improve Outcome: Not Progressing Goal: Emotional status will improve Outcome: Not Progressing Goal: Mental status will improve Outcome: Not Progressing Goal: Verbalization of understanding the information provided will improve Outcome: Not Progressing   Problem: Activity: Goal: Interest or engagement in activities will improve Outcome: Not Progressing Goal: Sleeping patterns will improve Outcome: Not Progressing   Problem: Coping: Goal: Ability to verbalize frustrations and anger appropriately will improve Outcome: Not Progressing Goal: Ability to demonstrate self-control will improve Outcome: Not Progressing   Problem: Health Behavior/Discharge Planning: Goal: Identification of resources available to assist in meeting health care needs will improve Outcome: Not Progressing Goal: Compliance with treatment plan for underlying cause of condition will  improve Outcome: Not Progressing   Problem: Physical Regulation: Goal: Ability to maintain clinical measurements within normal limits will improve Outcome: Not Progressing   Problem: Safety: Goal: Periods of time without injury will increase Outcome: Not Progressing   Problem: Activity: Goal: Will verbalize the importance of balancing activity with adequate rest periods Outcome: Not Progressing   Problem: Education: Goal: Will be free of psychotic symptoms Outcome: Not Progressing Goal: Knowledge of the prescribed therapeutic regimen will improve Outcome: Not Progressing   Problem: Coping: Goal: Coping ability will improve Outcome: Not Progressing Goal: Will verbalize feelings Outcome: Not Progressing   Problem: Health Behavior/Discharge Planning: Goal: Compliance with prescribed medication regimen will improve Outcome: Not Progressing   Problem: Nutritional: Goal: Ability to achieve adequate nutritional intake will improve Outcome: Not Progressing   Problem: Role Relationship: Goal: Ability to communicate needs accurately will improve Outcome: Not Progressing Goal: Ability to interact with others will improve Outcome: Not Progressing   Problem: Safety: Goal: Ability to redirect hostility and anger into socially appropriate behaviors will improve Outcome: Not Progressing Goal: Ability to remain free from injury will improve Outcome: Not Progressing   Problem: Self-Care: Goal: Ability to participate in self-care as condition permits will improve Outcome: Not Progressing   Problem: Self-Concept: Goal: Will verbalize positive feelings about self Outcome: Not Progressing

## 2020-01-08 NOTE — H&P (Signed)
Psychiatric Admission Assessment Adult  Patient Identification: Becky Hubbard MRN:  [id number] Date of Evaluation:  01/08/2020 Chief Complaint:  Major depressive disorder, recurrent episode, severe, with psychosis ([place]) [F33.3] Principal Diagnosis: Major depressive disorder, recurrent episode, severe, with psychosis ([place]) Diagnosis:  Principal Problem:   Major depressive disorder, recurrent episode, severe, with psychosis ([place]) Active Problems:   Fibromyalgia syndrome   Auditory hallucinations   PTSD (post-traumatic stress disorder)   ADD (attention deficit disorder)   Borderline diabetes  History of Present Illness:  Becky Hubbard a 49 y.o.femalepatient admitted with psychosis. She has a history of MDD with psychosis, ADD, head injury from [place] in 2008, and PTSD. Prior to admission she stopped performing ADLs, and behaving bizarrely. Overnight, patient was admitted and urinated on the floor in front of nurses station prior to arrival. She was only oriented to self at that time, and appeared confused. Patient assessed today during treatment team, and again one-on-one at bedside. She states that she is the [facility]. When asked how she is feelings she says "not good." She says it is stressful being dead. She says she has died many times. Upon reassurance that she is alive in the hospital, she says, "Well I am about to die again. They are poisoning me." She refuses to answer any further questions at this time. As provider leaves the room she asks for a cigarette. When told we could only provide a nicotine patch or nicotine gum she declines both saying, "nevermind."   Associated Signs/Symptoms: Depression Symptoms:  depressed mood, anhedonia, psychomotor retardation, feelings of worthlessness/guilt, hopelessness, Duration of Depression Symptoms: No data recorded (Hypo) Manic Symptoms:  Impulsivity, Anxiety Symptoms:  Excessive Worry, Psychotic Symptoms:  Delusions, Paranoia, Duration  of Psychotic Symptoms: No data recorded PTSD Symptoms: history of PTSD listed in chart Total Time spent with patient: 45 minutes  Past Psychiatric History: Previously seen by [facility] unit by Dr. Kasandra Knudsen, prescribed Cymbalta 30 mg daily, Valium 10 mg TID, Vyvanse 40 mg daily. No prior inpatient admissions. No previous suicide attempts.   Is the patient at risk to self? Yes.    Has the patient been a risk to self in the past 6 months? No.  Has the patient been a risk to self within the distant past? No.  Is the patient a risk to others? No.  Has the patient been a risk to others in the past 6 months? No.  Has the patient been a risk to others within the distant past? No.   Prior Inpatient Therapy:   Prior Outpatient Therapy:    Alcohol Screening: 1. How often do you have a drink containing alcohol?: Never 2. How many drinks containing alcohol do you have on a typical day when you are drinking?: 1 or 2 3. How often do you have six or more drinks on one occasion?: Never AUDIT-C Score: 0 4. How often during the last year have you found that you were not able to stop drinking once you had started?: Never 5. How often during the last year have you failed to do what was normally expected from you because of drinking?: Never 6. How often during the last year have you needed a first drink in the morning to get yourself going after a heavy drinking session?: Never 7. How often during the last year have you had a feeling of guilt of remorse after drinking?: Never 8. How often during the last year have you been unable to remember what happened the  night before because you had been drinking?: Never 9. Have you or someone else been injured as a result of your drinking?: No 10. Has a relative or friend or a doctor or another health worker been concerned about your drinking or suggested you cut down?: No Alcohol Use Disorder Identification Test Final Score (AUDIT): 0 Alcohol Brief  Interventions/Follow-up: Brief Advice Substance Abuse History in the last 12 months:  No. Consequences of Substance Abuse: Negative Previous Psychotropic Medications: Yes  Psychological Evaluations: Yes  Past Medical History:  Past Medical History:  Diagnosis Date  . ADD (attention deficit disorder)   . Anxiety   . Arthritis   . Auditory hallucinations   . Carpal tunnel syndrome on both sides   . Cervical spine fracture ([place]) 2008   Closed with screw C2-3 [facility] Dr. Delilah Shan  . Degenerative joint disease of spine   . Depression   . Diabetes mellitus without complication ([place])   . Family history of adverse reaction to anesthesia    mom- slow to awaken  . Fibromyalgia   . Foot fracture, left   . Gastroparesis   . Gastroparesis   . GERD (gastroesophageal reflux disease)   . Hearing loss   . Herniated cervical disc   . History of prescription drug abuse   . Neuromuscular disorder (HCC)    fibromyalgia/chronic fatique syndrome  . Neuropathy of foot   . OCD (obsessive compulsive disorder)   . 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 Plaque psoriasis   . PTSD (post-traumatic stress disorder)   . Stroke ([place])   . Ulcer     Past Surgical History:  Procedure Laterality Date  . CARPAL TUNNEL RELEASE Left 07/12/2015   Procedure: OPEN CARPAL TUNNEL RELEASE OF LEFT HAND;  Surgeon: Leanor Kail, MD;  Location: [facility];  Service: Orthopedics;  Laterality: Left;  . cervical radiofrequency neurotomy    . FRACTURE SURGERY    . MOUTH SURGERY     upper and lower dentures  . NASAL SEPTUM SURGERY    . occipital nerve blocks    . SPINE SURGERY     cervical   Family History:  Family History  Problem Relation Age of Onset  . Cancer Mother 72       breast cancer  . Migraines Mother   . Hypertension Mother   . Arthritis Mother   . Sleep apnea Mother   . Anxiety  disorder Mother   . Depression Mother   . Diabetes Father   . Cancer Father 59       of spine , pancreas and liver  . Anxiety disorder Father   . Depression Father   . Diabetes Brother   . Hyperlipidemia Brother   . Sleep apnea Brother   . Cancer Maternal Grandfather        leukemia, prostate  . Cancer Paternal Grandfather        bone  . Cancer Maternal Aunt        breast  . Mental illness Maternal Grandmother        Dementia  . Stroke Paternal Grandmother   . Diabetes Brother   . Cancer Paternal Aunt        breast   Family Psychiatric  History: Mother with depression and axiety Tobacco Screening: Have you used any form of tobacco in the last 30 days? (Cigarettes, Smokeless  Tobacco, Cigars, and/or Pipes): No Social History:  Social History   Substance and Sexual Activity  Alcohol Use No     Social History   Substance and Sexual Activity  Drug Use No    Additional Social History: Marital status: Divorced Divorced, when?: "I don't know" Does patient have children?: No                         Allergies:   Allergies  Allergen Reactions  . Doxycycline   . Lyrica [Pregabalin] Swelling    Ankles swell and turn red  . Neurontin [Gabapentin]   . Chantix [Varenicline] Rash  . Prednisone Other (See Comments)  . Other Other (See Comments)    Cats - cough/sneezing  . Penicillin G Hives  . Penicillins Hives    Has patient had a PCN reaction causing immediate rash, facial/tongue/throat swelling, SOB or lightheadedness with hypotension: No Has patient had a PCN reaction causing severe rash involving mucus membranes or skin necrosis: No Has patient had a PCN reaction that required hospitalization No Has patient had a PCN reaction occurring within the last 10 years: No If all of the above answers are "NO", then may proceed with Cephalosporin use.   . Sulfa Antibiotics Hives, Rash and Other (See Comments)   Lab Results:  Results for orders placed or performed  during the hospital encounter of 01/07/20 (from the past 48 hour(s))  Glucose, capillary     Status: Abnormal   Collection Time: 01/08/20 10:27 AM  Result Value Ref Range   Glucose-Capillary 130 (H) 70 - 99 mg/dL    Comment: Glucose reference range applies only to samples taken after fasting for at least 8 hours.    Blood Alcohol level:  Lab Results  Component Value Date   ETH <10 97/03/6376    Metabolic Disorder Labs:  Lab Results  Component Value Date   HGBA1C 5.7 (H) 02/12/2016   MPG 117 02/12/2016   No results found for: PROLACTIN Lab Results  Component Value Date   CHOL 136 11/01/2019   TRIG 113 11/01/2019   HDL 35 (L) 11/01/2019   CHOLHDL 3.9 11/01/2019   VLDL 52 (H) 07/31/2014   LDLCALC 80 11/01/2019   LDLCALC 79 07/04/2015    Current Medications: Current Facility-Administered Medications  Medication Dose Route Frequency Provider Last Rate Last Admin  . acetaminophen (TYLENOL) tablet 650 mg  650 mg Oral Q6H PRN Patrecia Pour, NP   650 mg at 01/08/20 0921  . alum & mag hydroxide-simeth (MAALOX/MYLANTA) 200-200-20 MG/5ML suspension 30 mL  30 mL Oral Q4H PRN Patrecia Pour, NP      . aspirin EC tablet 81 mg  81 mg Oral Daily Patrecia Pour, NP   81 mg at 01/08/20 0920  . diazepam (VALIUM) tablet 5 mg  5 mg Oral TID PRN Patrecia Pour, NP   5 mg at 01/07/20 2356  . DULoxetine (CYMBALTA) DR capsule 30 mg  30 mg Oral Daily Patrecia Pour, NP   30 mg at 01/08/20 0920  . haloperidol (HALDOL) tablet 2 mg  2 mg Oral BID Patrecia Pour, NP   2 mg at 01/08/20 0920  . lisdexamfetamine (VYVANSE) capsule 40 mg  40 mg Oral Daily Patrecia Pour, NP   40 mg at 01/08/20 0920  . metFORMIN (GLUCOPHAGE) tablet 500 mg  500 mg Oral Q breakfast Patrecia Pour, NP   500 mg at 01/08/20 0920   PTA  Medications: Medications Prior to Admission  Medication Sig Dispense Refill Last Dose  . aspirin 81 MG tablet Take 81 mg by mouth daily.     . busPIRone (BUSPAR) 15 MG tablet Take 15 mg  by mouth 2 (two) times daily.      . cyclobenzaprine (FLEXERIL) 10 MG tablet Take 5-10 mg by mouth at bedtime as needed for muscle spasms.     . diazepam (VALIUM) 10 MG tablet Take 10 mg by mouth 3 (three) times daily as needed for anxiety.   1   . DULoxetine (CYMBALTA) 60 MG capsule Take 60 mg by mouth 2 (two) times daily.   4   . EPINEPHrine 0.3 mg/0.3 mL IJ SOAJ injection Inject 0.3 mg into the muscle as needed for anaphylaxis. 1 each 0   . etonogestrel (NEXPLANON) 68 MG IMPL implant 1 each (68 mg total) by Subdermal route once for 1 dose. 1 each 0   . fluticasone (FLONASE) 50 MCG/ACT nasal spray Place 2 sprays into both nostrils daily.      . haloperidol (HALDOL) 2 MG tablet Take 2 mg by mouth at bedtime.      . metFORMIN (GLUCOPHAGE) 500 MG tablet Take 500 mg by mouth daily.      . pantoprazole (PROTONIX) 40 MG tablet TAKE ONE TABLET EVERY DAY (Patient taking differently: Take 40 mg by mouth daily. ) 30 tablet 3   . prazosin (MINIPRESS) 2 MG capsule Take 2 mg by mouth at bedtime.     Marland Kitchen PROAIR HFA 108 (90 Base) MCG/ACT inhaler Inhale 2 puffs into the lungs every 6 (six) hours as needed for wheezing or shortness of breath.      Marland Kitchen VYVANSE 40 MG capsule Take 40 mg by mouth every morning.     . ziprasidone (GEODON) 40 MG capsule Take 40 mg by mouth daily with supper.     . zolpidem (AMBIEN) 10 MG tablet Take 10 mg by mouth at bedtime.       Musculoskeletal: Strength & Muscle Tone: within normal limits Gait & Station: normal Patient leans: N/A  Psychiatric Specialty Exam: Physical Exam Vitals and nursing note reviewed.  Constitutional:      Appearance: Normal appearance.  HENT:     Head: Normocephalic and atraumatic.     Right Ear: External ear normal.     Left Ear: External ear normal.     Nose: Nose normal.     Mouth/Throat:     Mouth: Mucous membranes are moist.     Pharynx: Oropharynx is clear.  Eyes:     Conjunctiva/sclera: Conjunctivae normal.     Pupils: Pupils are equal,  round, and reactive to light.  Cardiovascular:     Rate and Rhythm: Normal rate.     Pulses: Normal pulses.  Pulmonary:     Effort: Pulmonary effort is normal.     Breath sounds: Normal breath sounds.  Abdominal:     General: Abdomen is flat.     Palpations: Abdomen is soft.  Musculoskeletal:        General: No swelling. Normal range of motion.     Cervical back: Normal range of motion and neck supple.  Skin:    General: Skin is warm and dry.  Neurological:     General: No focal deficit present.     Mental Status: She is alert. She is disoriented.  Psychiatric:        Attention and Perception: She perceives auditory and visual hallucinations.  Mood and Affect: Mood is anxious. Affect is flat.        Speech: Speech is delayed.        Behavior: Behavior is slowed.        Thought Content: Thought content is paranoid and delusional. Thought content includes suicidal ideation.        Cognition and Memory: Cognition is impaired. Memory is impaired.        Judgment: Judgment is impulsive.     Review of Systems  Constitutional: Negative for activity change and appetite change.  HENT: Negative for rhinorrhea and sore throat.   Eyes: Negative for photophobia and visual disturbance.  Respiratory: Negative for cough and shortness of breath.   Cardiovascular: Negative for chest pain and palpitations.  Gastrointestinal: Negative for constipation, diarrhea, nausea and vomiting.  Endocrine: Negative for polyphagia.  Genitourinary: Negative for difficulty urinating and dysuria.  Musculoskeletal: Negative for arthralgias and back pain.  Skin: Negative for rash and wound.  Allergic/Immunologic: Positive for environmental allergies. Negative for immunocompromised state.  Neurological: Negative for dizziness and headaches.  Hematological: Negative for adenopathy. Does not bruise/bleed easily.  Psychiatric/Behavioral: Positive for decreased concentration, hallucinations and suicidal ideas.  The patient is nervous/anxious.     Blood pressure 111/67, pulse (!) 109, temperature 98 F (36.7 C), resp. rate 12, height 5\' 5"  (1.651 m), weight 79.8 kg, SpO2 100 %.Body mass index is 29.29 kg/m.  General Appearance: Disheveled  Eye Contact:  Poor  Speech:  Normal Rate  Volume:  Decreased  Mood:  Depressed  Affect:  Blunt  Thought Process:  Disorganized  Orientation:  Other:  to person  Thought Content:  Illogical, Paranoid Ideation and Rumination  Suicidal Thoughts:  No  Homicidal Thoughts:  No  Memory:  Immediate;   Poor Recent;   Poor Remote;   Poor  Judgement:  Impaired  Insight:  Lacking  Psychomotor Activity:  Decreased  Concentration:  Concentration: Poor and Attention Span: Poor  Recall:  Poor  Fund of Knowledge:  Poor  Language:  Poor  Akathisia:  Negative  Handed:  Right  AIMS (if indicated):     Assets:  Desire for Improvement Housing Resilience Social Support  ADL's:  Impaired  Cognition:  Impaired,  Moderate  Sleep:  Number of Hours: 5       Treatment Plan Summary: Daily contact with patient to assess and evaluate symptoms and progress in treatment and Medication management PLAN OF CARE: Continue involuntary inpatient admission. Daily contact with patient to assess and evaluate symptoms and progress in treatment, Medication management and Plan Major depressive disorder, recurrent, severe with psychosis: - Continue Cymbalta 30 mg daily - Haldol 2 mg BID for psychosis  Anxiety: -Continue reduced dose of Valium 5 mg TID (previous 10 TID prior to admission)  Attention issues: -Continue Vyvanse 40 mg daily  Observation Level/Precautions:  15 minute checks  Laboratory:  HbAIC lipid panel  Psychotherapy:    Medications:    Consultations:    Discharge Concerns:    Estimated LOS:  Other:     Physician Treatment Plan for Primary Diagnosis: Major depressive disorder, recurrent episode, severe, with psychosis ([place]) Long Term Goal(s): Improvement  in symptoms so as ready for discharge  Short Term Goals: Ability to identify changes in lifestyle to reduce recurrence of condition will improve, Ability to verbalize feelings will improve, Ability to disclose and discuss suicidal ideas, Ability to demonstrate self-control will improve, Ability to identify and develop effective coping behaviors will improve and Compliance with prescribed  medications will improve  Physician Treatment Plan for Secondary Diagnosis: Principal Problem:   Major depressive disorder, recurrent episode, severe, with psychosis ([place]) Active Problems:   Fibromyalgia syndrome   Auditory hallucinations   PTSD (post-traumatic stress disorder)   ADD (attention deficit disorder)   Borderline diabetes  Long Term Goal(s): Improvement in symptoms so as ready for discharge  Short Term Goals: Ability to identify changes in lifestyle to reduce recurrence of condition will improve, Ability to verbalize feelings will improve, Ability to disclose and discuss suicidal ideas, Ability to demonstrate self-control will improve, Ability to identify and develop effective coping behaviors will improve and Compliance with prescribed medications will improve  I certify that inpatient services furnished can reasonably be expected to improve the patient's condition.    Salley Scarlet, MD 11/29/202112:29 PM

## 2020-01-08 NOTE — [facility] Suicide Risk Assessment (Signed)
[facility] Admission Suicide Risk Assessment   Nursing information obtained from:  Patient Demographic factors:  Caucasian Current Mental Status:  NA Loss Factors:  Decline in physical health Historical Factors:  Impulsivity Risk Reduction Factors:  Positive social support  Total Time spent with patient: 45 minutes Principal Problem: Major depressive disorder, recurrent episode, severe, with psychosis ([place]) Diagnosis:  Principal Problem:   Major depressive disorder, recurrent episode, severe, with psychosis ([place]) Active Problems:   Fibromyalgia syndrome   Auditory hallucinations   PTSD (post-traumatic stress disorder)   ADD (attention deficit disorder)   Borderline diabetes  Subjective Data:Becky Hubbard is a 49 y.o. female patient admitted with psychosis. She has a history of MDD with psychosis, ADD, head injury from [place] in 2008, and PTSD. Prior to admission she stopped performing ADLs, and behaving bizarrely. Overnight, patient was admitted and urinated on the floor in front of nurses station prior to arrival. She was only oriented to self at that time, and appeared confused. Patient assessed today during treatment team, and again one-on-one at bedside. She states that she is the [facility]. When asked how she is feelings she says "not good." She says it is stressful being dead. She says she has died many times. Upon reassurance that she is alive in the hospital, she says, "Well I am about to die again. They are poisoning me." She refuses to answer any further questions at this time. As provider leaves the room she asks for a cigarette. When told we could only provide a nicotine patch or nicotine gum she declines both saying, "nevermind."   Continued Clinical Symptoms:  Alcohol Use Disorder Identification Test Final Score (AUDIT): 0 The "Alcohol Use Disorders Identification Test", Guidelines for Use in Primary Care, Second Edition.  World Pharmacologist [facility]). Score between 0-7:  no or low  risk or alcohol related problems. Score between 8-15:  moderate risk of alcohol related problems. Score between 16-19:  high risk of alcohol related problems. Score 20 or above:  warrants further diagnostic evaluation for alcohol dependence and treatment.   CLINICAL FACTORS:   Severe Anxiety and/or Agitation Depression:   Anhedonia Hopelessness Severe Currently Psychotic Unstable or Poor Therapeutic Relationship Previous Psychiatric Diagnoses and Treatments Medical Diagnoses and Treatments/Surgeries   Musculoskeletal: Strength & Muscle Tone: within normal limits Gait & Station: normal Patient leans: N/A  Psychiatric Specialty Exam: Physical Exam  Review of Systems  Blood pressure 111/67, pulse (!) 109, temperature 98 F (36.7 C), resp. rate 12, height 5\' 5"  (1.651 m), weight 79.8 kg, SpO2 100 %.Body mass index is 29.29 kg/m.  General Appearance: Disheveled  Eye Contact:  Poor  Speech:  Normal Rate  Volume:  Decreased  Mood:  Depressed  Affect:  Blunt  Thought Process:  Disorganized  Orientation:  Other:  to person  Thought Content:  Illogical, Paranoid Ideation and Rumination  Suicidal Thoughts:  No  Homicidal Thoughts:  No  Memory:  Immediate;   Poor Recent;   Poor Remote;   Poor  Judgement:  Impaired  Insight:  Lacking  Psychomotor Activity:  Decreased  Concentration:  Concentration: Poor and Attention Span: Poor  Recall:  Poor  Fund of Knowledge:  Poor  Language:  Poor  Akathisia:  Negative  Handed:  Right  AIMS (if indicated):     Assets:  Desire for Improvement Housing Resilience Social Support  ADL's:  Impaired  Cognition:  Impaired,  Moderate  Sleep:  Number of Hours: 5      COGNITIVE FEATURES  THAT CONTRIBUTE TO RISK:  Loss of executive function    SUICIDE RISK:   Moderate:  Frequent suicidal ideation with limited intensity, and duration, some specificity in terms of plans, no associated intent, good self-control, limited  dysphoria/symptomatology, some risk factors present, and identifiable protective factors, including available and accessible social support.  PLAN OF CARE: Continue involuntary inpatient admission. Daily contact with patient to assess and evaluate symptoms and progress in treatment, Medication management and Plan  Major depressive disorder, recurrent, severe with psychosis:  - Continue Cymbalta 30 mg daily - Haldol 2 mg BID for psychosis  Anxiety: -Continue reduced dose of Valium 5 mg TID (previous 10 TID prior to admission)  Attention issues: -Continue Vyvanse 40 mg daily  I certify that inpatient services furnished can reasonably be expected to improve the patient's condition.   Salley Scarlet, MD 01/08/2020, 12:29 PM

## 2020-01-09 DIAGNOSIS — F333 Major depressive disorder, recurrent, severe with psychotic symptoms: Secondary | ICD-10-CM | POA: Diagnosis not present

## 2020-01-09 LAB — URINALYSIS, ROUTINE W REFLEX MICROSCOPIC
Bilirubin Urine: NEGATIVE
Glucose, UA: NEGATIVE mg/dL
Ketones, ur: NEGATIVE mg/dL
Leukocytes,Ua: NEGATIVE
Nitrite: NEGATIVE
Protein, ur: NEGATIVE mg/dL
Specific Gravity, Urine: 1.001 — ABNORMAL LOW (ref 1.005–1.030)
pH: 7 (ref 5.0–8.0)

## 2020-01-09 MED ORDER — DIAZEPAM 5 MG PO TABS
5.0000 mg | ORAL_TABLET | Freq: Two times a day (BID) | ORAL | Status: DC | PRN
  Administered 2020-01-09 – 2020-01-10 (×2): 5 mg via ORAL
  Filled 2020-01-09 (×2): qty 1

## 2020-01-09 NOTE — Plan of Care (Signed)
  Problem: Education: Goal: Knowledge of [facility] Education information/materials will improve Outcome: Not Progressing Goal: Emotional status will improve Outcome: Not Progressing Goal: Mental status will improve Outcome: Not Progressing Goal: Verbalization of understanding the information provided will improve Outcome: Not Progressing   Problem: Activity: Goal: Interest or engagement in activities will improve Outcome: Not Progressing Goal: Sleeping patterns will improve Outcome: Not Progressing   Problem: Coping: Goal: Ability to verbalize frustrations and anger appropriately will improve Outcome: Not Progressing Goal: Ability to demonstrate self-control will improve Outcome: Not Progressing   Problem: Health Behavior/Discharge Planning: Goal: Identification of resources available to assist in meeting health care needs will improve Outcome: Not Progressing Goal: Compliance with treatment plan for underlying cause of condition will improve Outcome: Not Progressing   Problem: Physical Regulation: Goal: Ability to maintain clinical measurements within normal limits will improve Outcome: Not Progressing   Problem: Safety: Goal: Periods of time without injury will increase Outcome: Not Progressing   Problem: Activity: Goal: Will verbalize the importance of balancing activity with adequate rest periods Outcome: Not Progressing   Problem: Education: Goal: Will be free of psychotic symptoms Outcome: Not Progressing Goal: Knowledge of the prescribed therapeutic regimen will improve Outcome: Not Progressing   Problem: Coping: Goal: Coping ability will improve Outcome: Not Progressing Goal: Will verbalize feelings Outcome: Not Progressing   Problem: Health Behavior/Discharge Planning: Goal: Compliance with prescribed medication regimen will improve Outcome: Not Progressing   Problem: Nutritional: Goal: Ability to achieve adequate nutritional intake will  improve Outcome: Not Progressing   Problem: Role Relationship: Goal: Ability to communicate needs accurately will improve Outcome: Not Progressing Goal: Ability to interact with others will improve Outcome: Not Progressing   Problem: Safety: Goal: Ability to redirect hostility and anger into socially appropriate behaviors will improve Outcome: Not Progressing Goal: Ability to remain free from injury will improve Outcome: Not Progressing   Problem: Self-Care: Goal: Ability to participate in self-care as condition permits will improve Outcome: Not Progressing   Problem: Self-Concept: Goal: Will verbalize positive feelings about self Outcome: Not Progressing

## 2020-01-09 NOTE — Plan of Care (Signed)
Patient endorses anxiety and depression   Problem: Education: Goal: Emotional status will improve Outcome: Not Progressing Goal: Mental status will improve Outcome: Not Progressing   

## 2020-01-09 NOTE — Progress Notes (Signed)
D- Patient alert and oriented. Affect/mood. Pt denies SI, HI, AVH, and pain. Pt has been mainly withdrawn.Pt has been withdrawn and sad. PRN was given this afternoon and it was effective.   A- Scheduled medications administered to patient, per MD orders. Support and encouragement provided.  Routine safety checks conducted every 15 minutes.  Patient informed to notify staff with problems or concerns.  R- No adverse drug reactions noted. Patient contracts for safety at this time. Patient compliant with medications and treatment plan. Patient receptive, calm, and cooperative. Patient interacts well with others on the unit.  Patient remains safe at this time.  Collier Bullock RN

## 2020-01-09 NOTE — Plan of Care (Signed)
Pt rates depression and anxiety both at 10/10. Pt denies SI, HI and AVH. Pt was educated on care plan and verbalizes understanding. Pt was encouraged to attend groups. Collier Bullock RN Problem: Education: Goal: Knowledge of [place] [facility] Education information/materials will improve Outcome: Progressing Goal: Emotional status will improve Outcome: Progressing Goal: Mental status will improve Outcome: Progressing Goal: Verbalization of understanding the information provided will improve Outcome: Progressing   Problem: Activity: Goal: Interest or engagement in activities will improve Outcome: Progressing Goal: Sleeping patterns will improve Outcome: Progressing   Problem: Coping: Goal: Ability to verbalize frustrations and anger appropriately will improve Outcome: Progressing Goal: Ability to demonstrate self-control will improve Outcome: Progressing   Problem: Health Behavior/Discharge Planning: Goal: Identification of resources available to assist in meeting health care needs will improve Outcome: Progressing Goal: Compliance with treatment plan for underlying cause of condition will improve Outcome: Progressing   Problem: Physical Regulation: Goal: Ability to maintain clinical measurements within normal limits will improve Outcome: Progressing   Problem: Safety: Goal: Periods of time without injury will increase Outcome: Progressing   Problem: Activity: Goal: Will verbalize the importance of balancing activity with adequate rest periods Outcome: Progressing   Problem: Education: Goal: Will be free of psychotic symptoms Outcome: Progressing Goal: Knowledge of the prescribed therapeutic regimen will improve Outcome: Progressing   Problem: Coping: Goal: Coping ability will improve Outcome: Progressing Goal: Will verbalize feelings Outcome: Progressing   Problem: Health Behavior/Discharge Planning: Goal: Compliance with prescribed medication regimen will  improve Outcome: Progressing   Problem: Nutritional: Goal: Ability to achieve adequate nutritional intake will improve Outcome: Progressing   Problem: Role Relationship: Goal: Ability to communicate needs accurately will improve Outcome: Progressing Goal: Ability to interact with others will improve Outcome: Progressing   Problem: Safety: Goal: Ability to redirect hostility and anger into socially appropriate behaviors will improve Outcome: Progressing Goal: Ability to remain free from injury will improve Outcome: Progressing   Problem: Self-Care: Goal: Ability to participate in self-care as condition permits will improve Outcome: Progressing   Problem: Self-Concept: Goal: Will verbalize positive feelings about self Outcome: Progressing

## 2020-01-09 NOTE — Progress Notes (Signed)
[facility] MD Progress Note  01/09/2020 10:52 AM Becky Hubbard  MRN:  [id number]   Subjective:  Becky Hubbard a 49 y.o.femalepatient admitted with psychosis.She has a history of MDD with psychosis, ADD, head injury from [place] in 2008, and PTSD. Prior to admission she stopped performing ADLs, and behaving bizarrely. Yesterday, and overnight patient was hyper religious stating she was the [employer] and [employer] and [employer]. She again urinated on the floor near the nursing station. She has been medication compliant during admission.   Patient assessed one-on-one at bedside today. She is oriented x three during time of conversation. She notes that she has been feeling very depressed since her Uncle passed away recently. She is grieving the loss of her family member, and notes that things have not been the same since moving in with her mother. She notes she was so depressed she was having trouble taking showers and caring about her cleanliness. She notes that she is still feeling depressed and anxious, but denies suicidal ideations and homicidal ideations. She requests to have her sweat pants from her locker washed so she can put on clean clothes today. Patient has received Valium once daily PRN since admission, will continue to decrease dose as this appears to have contributed to delirium upon admission.   Spoke with patient's mother, Becky Hubbard, mother, [phone] today. She notes that patient has not been doing well since her brother (patient's uncle) passed away recently. [name] notes that her Valium was filled on Nov 18th, and is already empty. She is unsure if patient took an excess amount, or threw them away. Patient is very secretive about her medications, and manages her own. Discussed current medication regimen and plan of care. She has no questions or concerns aside from upcoming court date on Friday. She states her lawyer is currently trying to get the date moved for her.   Principal Problem: Major  depressive disorder, recurrent episode, severe, with psychosis ([place]) Diagnosis: Principal Problem:   Major depressive disorder, recurrent episode, severe, with psychosis ([place]) Active Problems:   Fibromyalgia syndrome   Auditory hallucinations   PTSD (post-traumatic stress disorder)   ADD (attention deficit disorder)   Borderline diabetes  Total Time spent with patient: 30 minutes  Past Psychiatric History: Previously seen by [facility] unit by Dr. Kasandra Knudsen, prescribed Cymbalta 30 mg daily, Valium 10 mg TID, Vyvanse 40 mg daily. No prior inpatient admissions. No previous suicide attempts.   Past Medical History:  Past Medical History:  Diagnosis Date  . ADD (attention deficit disorder)   . Anxiety   . Arthritis   . Auditory hallucinations   . Carpal tunnel syndrome on both sides   . Cervical spine fracture ([place]) 2008   Closed with screw C2-3 [facility] Dr. Delilah Shan  . Degenerative joint disease of spine   . Depression   . Diabetes mellitus without complication ([place])   . Family history of adverse reaction to anesthesia    mom- slow to awaken  . Fibromyalgia   . Foot fracture, left   . Gastroparesis   . Gastroparesis   . GERD (gastroesophageal reflux disease)   . Hearing loss   . Herniated cervical disc   . History of prescription drug abuse   . Neuromuscular disorder (HCC)    fibromyalgia/chronic fatique syndrome  . Neuropathy of foot   . OCD (obsessive compulsive disorder)   . 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 Plaque psoriasis   . PTSD (post-traumatic stress disorder)   . Stroke ([place])   . Ulcer     Past Surgical History:  Procedure Laterality Date  . CARPAL TUNNEL RELEASE Left 07/12/2015   Procedure: OPEN CARPAL TUNNEL RELEASE OF LEFT HAND;  Surgeon: Leanor Kail, MD;  Location: [place];  Service: Orthopedics;  Laterality: Left;  .  cervical radiofrequency neurotomy    . FRACTURE SURGERY    . MOUTH SURGERY     upper and lower dentures  . NASAL SEPTUM SURGERY    . occipital nerve blocks    . SPINE SURGERY     cervical   Family History:  Family History  Problem Relation Age of Onset  . Cancer Mother 61       breast cancer  . Migraines Mother   . Hypertension Mother   . Arthritis Mother   . Sleep apnea Mother   . Anxiety disorder Mother   . Depression Mother   . Diabetes Father   . Cancer Father 71       of spine , pancreas and liver  . Anxiety disorder Father   . Depression Father   . Diabetes Brother   . Hyperlipidemia Brother   . Sleep apnea Brother   . Cancer Maternal Grandfather        leukemia, prostate  . Cancer Paternal Grandfather        bone  . Cancer Maternal Aunt        breast  . Mental illness Maternal Grandmother        Dementia  . Stroke Paternal Grandmother   . Diabetes Brother   . Cancer Paternal Aunt        breast   Family Psychiatric  History: Mother with depression and anxiety Social History:  Social History   Substance and Sexual Activity  Alcohol Use No     Social History   Substance and Sexual Activity  Drug Use No    Social History   Socioeconomic History  . Marital status: Divorced    Spouse name: Not on file  . Number of children: Not on file  . Years of education: Not on file  . Highest education level: Not on file  Occupational History  . Not on file  Tobacco Use  . Smoking status: Current Every Day Smoker    Packs/day: 1.00    Years: 20.00    Pack years: 20.00    Types: Cigarettes    Start date: 03/01/2011  . Smokeless tobacco: Never Used  Vaping Use  . Vaping Use: Never used  Substance and Sexual Activity  . Alcohol use: No  . Drug use: No  . Sexual activity: Yes    Birth control/protection: None  Other Topics Concern  . Not on file  Social History Narrative   Depression- history of suicide attempt and hospitalization about 1998 after  divorce   H/O hospitalization at [facility] for narcotics detox/withdrawal approx. 2000   Social Determinants of Health   Financial Resource Strain:   . Difficulty of Paying Living Expenses: Not on file  Food Insecurity:   . Worried About Charity fundraiser in the Last Year: Not on file  . Ran Out of Food in the Last Year: Not on file  Transportation Needs:   . Lack of Transportation (Medical): Not on file  . Lack of Transportation (Non-Medical): Not on file  Physical Activity:   . Days of Exercise per Week: Not on file  . Minutes of Exercise per Session: Not on file  Stress:   . Feeling of Stress : Not on file  Social Connections:   . Frequency of Communication with Friends and Family: Not on file  . Frequency of Social Gatherings with Friends and Family: Not on file  . Attends Religious Services: Not on file  . Active Member of Clubs or Organizations: Not on file  . Attends Archivist Meetings: Not on file  . Marital Status: Not on file   Additional Social History:                         Sleep: Fair  Appetite:  Fair  Current Medications: Current Facility-Administered Medications  Medication Dose Route Frequency Provider Last Rate Last Admin  . acetaminophen (TYLENOL) tablet 650 mg  650 mg Oral Q6H PRN Patrecia Pour, NP   650 mg at 01/08/20 2114  . alum & mag hydroxide-simeth (MAALOX/MYLANTA) 200-200-20 MG/5ML suspension 30 mL  30 mL Oral Q4H PRN Patrecia Pour, NP      . aspirin EC tablet 81 mg  81 mg Oral Daily Patrecia Pour, NP   81 mg at 01/09/20 0803  . diazepam (VALIUM) tablet 5 mg  5 mg Oral Q12H PRN Salley Scarlet, MD      . DULoxetine (CYMBALTA) DR capsule 30 mg  30 mg Oral Daily Patrecia Pour, NP   30 mg at 01/09/20 1696  . haloperidol (HALDOL) tablet 2 mg  2 mg Oral BID Patrecia Pour, NP   2 mg at 01/09/20 0802  . lisdexamfetamine (VYVANSE) capsule 40 mg  40 mg Oral Daily Patrecia Pour, NP   40 mg at 01/09/20 0909  .  metFORMIN (GLUCOPHAGE) tablet 500 mg  500 mg Oral Q breakfast Patrecia Pour, NP   500 mg at 01/09/20 7893    Lab Results:  Results for orders placed or performed during the hospital encounter of 01/07/20 (from the past 48 hour(s))  Glucose, capillary     Status: Abnormal   Collection Time: 01/08/20 10:27 AM  Result Value Ref Range   Glucose-Capillary 130 (H) 70 - 99 mg/dL    Comment: Glucose reference range applies only to samples taken after fasting for at least 8 hours.    Blood Alcohol level:  Lab Results  Component Value Date   ETH <10 81/02/7508    Metabolic Disorder Labs: Lab Results  Component Value Date   HGBA1C 5.7 (H) 02/12/2016   MPG 117 02/12/2016   No results found for: PROLACTIN Lab Results  Component Value Date   CHOL 136 11/01/2019   TRIG 113 11/01/2019   HDL 35 (L) 11/01/2019   CHOLHDL 3.9 11/01/2019   VLDL 52 (H) 07/31/2014   LDLCALC 80 11/01/2019   LDLCALC 79 07/04/2015    Physical Findings: AIMS: Facial and Oral Movements Muscles of Facial Expression: None, normal Lips and Perioral Area: None, normal Jaw: None, normal Tongue: None, normal,Extremity Movements Upper (arms, wrists, hands, fingers): None, normal Lower (legs, knees, ankles, toes): None, normal, Trunk Movements Neck, shoulders, hips: None, normal, Overall Severity Severity of abnormal movements (highest score from questions above): None, normal Incapacitation due to abnormal movements: None, normal Patient's awareness of abnormal movements (rate only patient's report): No Awareness, Dental Status Current problems with teeth and/or dentures?: No Does patient usually wear dentures?: No  CIWA:    COWS:     Musculoskeletal: Strength & Muscle Tone: within normal limits Gait & Station: normal Patient leans: N/A  Psychiatric Specialty Exam: Physical Exam Vitals and nursing note reviewed.  Constitutional:      Appearance: Normal appearance.  HENT:     Head: Normocephalic and  atraumatic.     Right Ear: External ear normal.     Left Ear: External ear normal.     Nose: Nose normal.     Mouth/Throat:     Mouth: Mucous membranes are moist.     Pharynx: Oropharynx is clear.  Eyes:     Extraocular Movements: Extraocular movements intact.     Conjunctiva/sclera: Conjunctivae normal.     Pupils: Pupils are equal, round, and reactive to light.  Cardiovascular:     Rate and Rhythm: Normal rate and regular rhythm.     Pulses: Normal pulses.     Heart sounds: Normal heart sounds.  Pulmonary:     Effort: Pulmonary effort is normal.     Breath sounds: Normal breath sounds.  Abdominal:     General: Abdomen is flat.     Palpations: Abdomen is soft.  Musculoskeletal:        General: No swelling. Normal range of motion.     Cervical back: Normal range of motion.  Skin:    General: Skin is warm and dry.  Neurological:     General: No focal deficit present.     Mental Status: She is alert and oriented to person, place, and time.  Psychiatric:        Attention and Perception: She is inattentive.        Mood and Affect: Mood is depressed. Affect is not tearful.        Speech: Speech is delayed.        Behavior: Behavior is slowed.        Thought Content: Thought content does not include suicidal ideation.        Cognition and Memory: Memory normal. Cognition is impaired.        Judgment: Judgment is impulsive.     Review of Systems  Constitutional: Positive for fatigue. Negative for appetite change.  HENT: Negative for nosebleeds and sore throat.   Eyes: Negative for photophobia and visual disturbance.  Respiratory: Negative for cough and shortness of breath.   Cardiovascular: Negative for chest pain and palpitations.  Gastrointestinal: Negative for constipation, diarrhea, nausea and vomiting.  Endocrine: Negative for cold intolerance and heat intolerance.  Genitourinary: Negative for difficulty urinating and dyspareunia.  Musculoskeletal: Negative for  arthralgias and gait problem.  Skin: Negative for rash and wound.  Allergic/Immunologic: Positive for environmental allergies. Negative for food allergies.  Neurological: Negative for dizziness and headaches.  Hematological: Negative for adenopathy. Does not bruise/bleed easily.  Psychiatric/Behavioral: Positive for confusion, dysphoric mood and sleep disturbance. Negative for suicidal ideas. The patient is nervous/anxious.     Blood pressure (!) 133/91, pulse (!) 121, temperature (!) 97.5 F (36.4 C), temperature source Oral, resp. rate 16, height 5\' 5"  (1.651 m), weight 79.8 kg, SpO2 98 %.Body mass index is 29.29 kg/m.  General Appearance: Disheveled  Eye Contact:  Good  Speech:  Clear and Coherent  Volume:  Normal  Mood:  Depressed and Dysphoric  Affect:  Congruent and Tearful  Thought Process:  Coherent  Orientation:  Full (Time, Place, and Person)  Thought Content:  Logical  Suicidal Thoughts:  No  Homicidal Thoughts:  No  Memory:  Immediate;   Fair Recent;   Poor Remote;   Poor  Judgement:  Impaired  Insight:  Present  Psychomotor Activity:  Decreased  Concentration:  Concentration: Fair and Attention Span: Fair  Recall:  [employer] of Knowledge:  Poor  Language:  Fair  Akathisia:  Negative  Handed:  Right  AIMS (if indicated):     Assets:  Desire for Improvement Financial Resources/Insurance Housing Resilience Social Support  ADL's:  Impaired  Cognition:  Impaired,  Mild  Sleep:  Number of Hours: 8     Treatment Plan Summary: Daily contact with patient to assess and evaluate symptoms and progress in treatment and Medication management  Major depressive disorder, recurrent, severe with psychosis: -ContinueCymbalta 30 mg daily - Haldol 2 mg BID for psychosis  Anxiety: -Continue to reduce dose of Valium 5 mg BID (previous 10 TID prior to admission)  Attention issues: -ContinueVyvanse 40 mg daily Salley Scarlet, MD 01/09/2020, 10:52 AM

## 2020-01-09 NOTE — Progress Notes (Signed)
Recreation Therapy Notes   Date: 01/09/2020  Time: 9:30 am   Location: Craft room     Behavioral response: N/A   Intervention Topic: Stress Management   Discussion/Intervention: Patient did not attend group.   Clinical Observations/Feedback:  Patient did not attend group.   Kaelei Wheeler LRT/CTRS        Lu Paradise 01/09/2020 1:00 PM

## 2020-01-09 NOTE — Progress Notes (Signed)
Patient is alert and oriented.  She is quiet and soft spoken with childlike affect. She has been hyper religious today at times telling staff and other patients that she is the Writer and he is speaking through her. She is pleasant and cooperative. She was med compliant and tolerated her meds without incident.  She denied SI  HI  AVH depression and anxiety at this encounter.  She does endorse having pain in her legs that she rated at a 5 on 0/10 scale. She is safe on the unit with 15 minute safety checks and encouraged  To contact staff with any concerns.      Cleo Butler-Nicholson, LPN

## 2020-01-10 LAB — LIPID PANEL
Cholesterol: 188 mg/dL (ref 0–200)
HDL: 31 mg/dL — ABNORMAL LOW (ref >40)
LDL Cholesterol: 125 mg/dL — ABNORMAL HIGH (ref 0–99)
Total CHOL/HDL Ratio: 6.1 RATIO
Triglycerides: 162 mg/dL — ABNORMAL HIGH (ref <150)
VLDL: 32 mg/dL (ref 0–40)

## 2020-01-10 LAB — HEMOGLOBIN A1C
Hgb A1c MFr Bld: 5.7 % — ABNORMAL HIGH (ref 4.8–5.6)
Mean Plasma Glucose: 116.89 mg/dL

## 2020-01-10 MED ORDER — DULOXETINE HCL 30 MG PO CPEP
60.0000 mg | ORAL_CAPSULE | Freq: Every day | ORAL | Status: DC
  Administered 2020-01-11 – 2020-01-19 (×9): 60 mg via ORAL
  Filled 2020-01-10 (×9): qty 2

## 2020-01-10 MED ORDER — ENSURE ENLIVE PO LIQD
237.0000 mL | Freq: Two times a day (BID) | ORAL | Status: DC
  Administered 2020-01-10 – 2020-01-18 (×14): 237 mL via ORAL

## 2020-01-10 NOTE — Progress Notes (Deleted)
Recreation Therapy Notes   Date: 01/10/2020  Time: 9:30 am   Location: Craft room     Behavioral response: N/A   Intervention Topic: Coping-Skills   Discussion/Intervention: Patient did not attend group.   Clinical Observations/Feedback:  Patient did not attend group.   Simon Aaberg LRT/CTRS        Braden Cimo 01/10/2020 1:32 PM

## 2020-01-10 NOTE — BHH Counselor (Signed)
CSW attempted to call the patient's mother.  CSW left HIPAA compliant voicemail.  Assunta Curtis, MSW, LCSW 01/10/2020 4:20 PM

## 2020-01-10 NOTE — Progress Notes (Signed)
Recreation Therapy Notes  INPATIENT RECREATION THERAPY ASSESSMENT  Patient Details Name: Becky Hubbard MRN: [id number] DOB: 1970-10-16 Today's Date: 01/10/2020       Information Obtained From: Patient  Able to Participate in Assessment/Interview: Yes  Patient Presentation: Responsive  Reason for Admission (Per Patient): Active Symptoms  Patient Stressors:    Coping Skills:   Music, Prayer  Leisure Interests (2+):  Music - Listen  Frequency of Recreation/Participation:    Awareness of Community Resources:     Community Resources:     Current Use:    If no, Barriers?:    Expressed Interest in [employer] Information:    [place] of Residence:  Insurance underwriter  Patient Main Form of Transportation: Musician  Patient Strengths:  Faith  Patient Identified Areas of Improvement:  Have faith  Patient Goal for Hospitalization:  To go home  Current SI (including self-harm):  No  Current HI:  No  Current AVH: No  Staff Intervention Plan: Group Attendance, Collaborate with Interdisciplinary Treatment Team  Consent to Intern Participation: N/A  Keri Veale 01/10/2020, 1:33 PM

## 2020-01-10 NOTE — Progress Notes (Signed)
Recreation Therapy Notes  INPATIENT RECREATION TR PLAN  Patient Details Name: Becky Hubbard MRN: [id number] DOB: 06/04/1970 Today's Date: 01/10/2020  Rec Therapy Plan Is patient appropriate for Therapeutic Recreation?: Yes Treatment times per week: at least 3 Estimated Length of Stay: 5-7 days TR Treatment/Interventions: Group participation (Comment)  Discharge Criteria    Discharge Summary     [name] 01/10/2020, 1:34 PM

## 2020-01-10 NOTE — Plan of Care (Signed)
Patient presents hyper-religious   Problem: Education: Goal: Emotional status will improve Outcome: Not Progressing Goal: Mental status will improve Outcome: Not Progressing

## 2020-01-10 NOTE — Progress Notes (Signed)
Patient calm and cooperative during assessment. Patient endorses anxiety and depression, denies SI/HI/AVH. Patient observed interacting appropriately with staff and peers on the unit. Patient didn't have any medications scheduled this evening and didn't request anything PRN. Patient given education, support, and encouragement to be active in her treatment plan. Patient being monitored Q 15 minutes for safety per unit protocol. Pt remains safe on the unit.

## 2020-01-10 NOTE — Plan of Care (Signed)
Pt rates depression 9/10 and anxiety 5/10. Pt denies SI, HI and AVH. Pt was educated on care plan and verbalizes understanding. Pt was encouraged to attend groups. Collier Bullock RN Problem: Education: Goal: Knowledge of [place] [facility] Education information/materials will improve Outcome: Progressing Goal: Emotional status will improve Outcome: Progressing Goal: Mental status will improve Outcome: Progressing Goal: Verbalization of understanding the information provided will improve Outcome: Progressing   Problem: Activity: Goal: Interest or engagement in activities will improve Outcome: Progressing Goal: Sleeping patterns will improve Outcome: Progressing   Problem: Coping: Goal: Ability to verbalize frustrations and anger appropriately will improve Outcome: Progressing Goal: Ability to demonstrate self-control will improve Outcome: Progressing   Problem: Health Behavior/Discharge Planning: Goal: Identification of resources available to assist in meeting health care needs will improve Outcome: Progressing Goal: Compliance with treatment plan for underlying cause of condition will improve Outcome: Progressing   Problem: Physical Regulation: Goal: Ability to maintain clinical measurements within normal limits will improve Outcome: Progressing   Problem: Safety: Goal: Periods of time without injury will increase Outcome: Progressing   Problem: Activity: Goal: Will verbalize the importance of balancing activity with adequate rest periods Outcome: Progressing   Problem: Education: Goal: Will be free of psychotic symptoms Outcome: Progressing Goal: Knowledge of the prescribed therapeutic regimen will improve Outcome: Progressing   Problem: Coping: Goal: Coping ability will improve Outcome: Progressing Goal: Will verbalize feelings Outcome: Progressing   Problem: Health Behavior/Discharge Planning: Goal: Compliance with prescribed medication regimen will  improve Outcome: Progressing   Problem: Nutritional: Goal: Ability to achieve adequate nutritional intake will improve Outcome: Progressing   Problem: Role Relationship: Goal: Ability to communicate needs accurately will improve Outcome: Progressing Goal: Ability to interact with others will improve Outcome: Progressing   Problem: Safety: Goal: Ability to redirect hostility and anger into socially appropriate behaviors will improve Outcome: Progressing Goal: Ability to remain free from injury will improve Outcome: Progressing   Problem: Self-Care: Goal: Ability to participate in self-care as condition permits will improve Outcome: Progressing   Problem: Self-Concept: Goal: Will verbalize positive feelings about self Outcome: Progressing

## 2020-01-10 NOTE — BHH Group Notes (Signed)
LCSW Group Therapy Note  01/10/2020 2:17 PM  Type of Therapy/Topic:  Group Therapy:  Emotion Regulation  Participation Level:  Did Not Attend   Description of Group:   The purpose of this group is to assist patients in learning to regulate negative emotions and experience positive emotions. Patients will be guided to discuss ways in which they have been vulnerable to their negative emotions. These vulnerabilities will be juxtaposed with experiences of positive emotions or situations, and patients will be challenged to use positive emotions to combat negative ones. Special emphasis will be placed on coping with negative emotions in conflict situations, and patients will process healthy conflict resolution skills.  Therapeutic Goals: 1. Patient will identify two positive emotions or experiences to reflect on in order to balance out negative emotions 2. Patient will label two or more emotions that they find the most difficult to experience 3. Patient will demonstrate positive conflict resolution skills through discussion and/or role plays  Summary of Patient Progress: X  Therapeutic Modalities:   Cognitive Behavioral Therapy Feelings Identification Dialectical Behavioral Therapy  Becky Hubbard R. Guerry Bruin, MSW, LCSW, [place] 01/10/2020 2:17 PM
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
## 2020-01-10 NOTE — Progress Notes (Signed)
Recreation Therapy Notes   Date: 01/10/2020  Time: 9:30 am   Location: Craft room     Behavioral response: N/A   Intervention Topic: Coping-Skills   Discussion/Intervention: Patient did not attend group.   Clinical Observations/Feedback:  Patient did not attend group.   Ubaldo Daywalt LRT/CTRS        Shamari Trostel 01/10/2020 1:07 PM

## 2020-01-10 NOTE — Progress Notes (Signed)
D- Patient alert and oriented. Affect/mood is cooperative and childlike. Pt denies SI, HI, AVH, and pain. Pt did not attend groups. Pt  told her mother that we made her wear a coat to bed.   A- Scheduled medications administered to patient, per MD orders. Support and encouragement provided.  Routine safety checks conducted every 15 minutes.  Patient informed to notify staff with problems or concerns.  R- No adverse drug reactions noted. Patient contracts for safety at this time. Patient compliant with medications and treatment plan. Patient receptive, calm, and cooperative. Patient interacts well with others on the unit.  Patient remains safe at this time.  Collier Bullock RN

## 2020-01-11 LAB — URINE CULTURE: Culture: >=100000 — AB

## 2020-01-11 MED ORDER — DIAZEPAM 5 MG PO TABS: 5.0000 mg | ORAL_TABLET | Freq: Every day | ORAL | Status: DC | PRN

## 2020-01-11 MED ORDER — FOSFOMYCIN TROMETHAMINE 3 G PO PACK
3.0000 g | PACK | Freq: Once | ORAL | Status: AC
  Administered 2020-01-11: 3 g via ORAL
  Filled 2020-01-11: qty 3

## 2020-01-11 NOTE — [facility] Group Notes (Signed)
[facility] Group Notes:  (Nursing/MHT/Case Management/Adjunct)  Date:  01/11/2020  Time:  9:54 PM  Type of Therapy:  Group Therapy  Participation Level:  Minimal  Participation Quality:  Left out of group before it begin.  Nehemiah Settle 01/11/2020, 9:54 PM

## 2020-01-11 NOTE — BHH Group Notes (Signed)
LCSW Group Therapy Note  01/11/2020 2:52 PM  Type of Therapy/Topic:  Group Therapy:  Balance in Life  Participation Level:  Did Not Attend  Description of Group:    This group will address the concept of balance and how it feels and looks when one is unbalanced. Patients will be encouraged to process areas in their lives that are out of balance and identify reasons for remaining unbalanced. Facilitators will guide patients in utilizing problem-solving interventions to address and correct the stressor making their life unbalanced. Understanding and applying boundaries will be explored and addressed for obtaining and maintaining a balanced life. Patients will be encouraged to explore ways to assertively make their unbalanced needs known to significant others in their lives, using other group members and facilitator for support and feedback.  Therapeutic Goals: 1. Patient will identify two or more emotions or situations they have that consume much of in their lives. 2. Patient will identify signs/triggers that life has become out of balance:  3. Patient will identify two ways to set boundaries in order to achieve balance in their lives:  4. Patient will demonstrate ability to communicate their needs through discussion and/or role plays  Summary of Patient Progress: X  Therapeutic Modalities:   Cognitive Behavioral Therapy Solution-Focused Therapy Assertiveness Training  Hedy Camara R. Guerry Bruin, MSW, [place], [place] 01/11/2020 2:52 PM

## 2020-01-11 NOTE — Progress Notes (Signed)
Patient calm and cooperative during assessment. Patient endorses anxiety and depression, denies SI/HI/AVH. Patient observed interacting appropriately with staff and peers on the unit. Patient didn't have any medications scheduled this evening and didn't request anything PRN. Patient given education, support, and encouragement to be active in her treatment plan. Patient being monitored Q 15 minutes for safety per unit protocol. Pt remains safe on the unit

## 2020-01-11 NOTE — Progress Notes (Signed)
[name] MD Progress Note  01/11/2020 12:16 PM [name]  MRN:  [id number]   Subjective:  Becky Hubbard a 49 y.o.femalepatient admitted with psychosis.She has a history of MDD with psychosis, ADD, head injury from [facility] in 2008, and PTSD. Prior to admission she stopped performing ADLs, and behaving bizarrely. No acute events overnight. She has been medication compliant during admission.   Patient assessed one-on-one in today. It appears she was disoriented this morning and clogged her toilet with excess toilet tissue today and yesterday. On our conversation later in the morning, she is oriented x three She notes she continues to have severe depression today, but feels her anxiety is somewhat better. She endorses passive suicidal thoughts today, but is able to contract for safety. She denies homicidal ideations, visual hallucinations, and auditory hallucinations. She has not experienced further episodes of incontinence, but has yet to shower or wash her clothes.   Contacted patient's lawyer, Ladona Rosten, [phone]. Have faxed a letter to his office for upcoming court date for Becky Hubbard. Also sent a copy via secure email per request.   Principal Problem: Major depressive disorder, recurrent episode, severe, with psychosis ([place]) Diagnosis: Principal Problem:   Major depressive disorder, recurrent episode, severe, with psychosis ([place]) Active Problems:   Fibromyalgia syndrome   Auditory hallucinations   PTSD (post-traumatic stress disorder)   ADD (attention deficit disorder)   Borderline diabetes  Total Time spent with patient: 30 minutes  Past Psychiatric History: Previously seen by [facility] unit by Dr. Kasandra Knudsen, prescribed Cymbalta 30 mg daily, Valium 10 mg TID, Vyvanse 40 mg daily. No prior inpatient admissions. No previous suicide attempts.   Past Medical History:  Past Medical History:  Diagnosis Date  . ADD (attention deficit disorder)   . Anxiety   . Arthritis   . Auditory  hallucinations   . Carpal tunnel syndrome on both sides   . Cervical spine fracture ([place]) 2008   Closed with screw C2-3 [facility] Dr. Delilah Shan  . Degenerative joint disease of spine   . Depression   . Diabetes mellitus without complication ([place])   . Family history of adverse reaction to anesthesia    mom- slow to awaken  . Fibromyalgia   . Foot fracture, left   . Gastroparesis   . Gastroparesis   . GERD (gastroesophageal reflux disease)   . Hearing loss   . Herniated cervical disc   . History of prescription drug abuse   . Neuromuscular disorder (HCC)    fibromyalgia/chronic fatique syndrome  . Neuropathy of foot   . OCD (obsessive compulsive disorder)   . 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 Plaque psoriasis   . PTSD (post-traumatic stress disorder)   . Stroke ([place])   . Ulcer     Past Surgical History:  Procedure Laterality Date  . CARPAL TUNNEL RELEASE Left 07/12/2015   Procedure: OPEN CARPAL TUNNEL RELEASE OF LEFT HAND;  Surgeon: Leanor Kail, MD;  Location: [place];  Service: Orthopedics;  Laterality: Left;  . cervical radiofrequency neurotomy    . FRACTURE SURGERY    . MOUTH SURGERY     upper and lower dentures  . NASAL SEPTUM SURGERY    . occipital nerve blocks    . SPINE SURGERY     cervical   Family History:  Family History  Problem Relation Age of  Onset  . Cancer Mother 31       breast cancer  . Migraines Mother   . Hypertension Mother   . Arthritis Mother   . Sleep apnea Mother   . Anxiety disorder Mother   . Depression Mother   . Diabetes Father   . Cancer Father 69       of spine , pancreas and liver  . Anxiety disorder Father   . Depression Father   . Diabetes Brother   . Hyperlipidemia Brother   . Sleep apnea Brother   . Cancer Maternal Grandfather        leukemia, prostate  . Cancer Paternal Grandfather        bone  .  Cancer Maternal Aunt        breast  . Mental illness Maternal Grandmother        Dementia  . Stroke Paternal Grandmother   . Diabetes Brother   . Cancer Paternal Aunt        breast   Family Psychiatric  History: Mother with depression and anxiety Social History:  Social History   Substance and Sexual Activity  Alcohol Use No     Social History   Substance and Sexual Activity  Drug Use No    Social History   Socioeconomic History  . Marital status: Divorced    Spouse name: Not on file  . Number of children: Not on file  . Years of education: Not on file  . Highest education level: Not on file  Occupational History  . Not on file  Tobacco Use  . Smoking status: Current Every Day Smoker    Packs/day: 1.00    Years: 20.00    Pack years: 20.00    Types: Cigarettes    Start date: 03/01/2011  . Smokeless tobacco: Never Used  Vaping Use  . Vaping Use: Never used  Substance and Sexual Activity  . Alcohol use: No  . Drug use: No  . Sexual activity: Yes    Birth control/protection: None  Other Topics Concern  . Not on file  Social History Narrative   Depression- history of suicide attempt and hospitalization about 1998 after divorce   H/O hospitalization at [facility] for narcotics detox/withdrawal approx. 2000   Social Determinants of Health   Financial Resource Strain:   . Difficulty of Paying Living Expenses: Not on file  Food Insecurity:   . Worried About Charity fundraiser in the Last Year: Not on file  . Ran Out of Food in the Last Year: Not on file  Transportation Needs:   . Lack of Transportation (Medical): Not on file  . Lack of Transportation (Non-Medical): Not on file  Physical Activity:   . Days of Exercise per Week: Not on file  . Minutes of Exercise per Session: Not on file  Stress:   . Feeling of Stress : Not on file  Social Connections:   . Frequency of Communication with Friends and Family: Not on file  . Frequency of Social  Gatherings with Friends and Family: Not on file  . Attends Religious Services: Not on file  . Active Member of Clubs or Organizations: Not on file  . Attends Archivist Meetings: Not on file  . Marital Status: Not on file   Additional Social History:                         Sleep: Fair  Appetite:  Fair  Current Medications: Current Facility-Administered Medications  Medication Dose Route Frequency Provider Last Rate Last Admin  . acetaminophen (TYLENOL) tablet 650 mg  650 mg Oral Q6H PRN Patrecia Pour, NP   650 mg at 01/11/20 0757  . alum & mag hydroxide-simeth (MAALOX/MYLANTA) 200-200-20 MG/5ML suspension 30 mL  30 mL Oral Q4H PRN Patrecia Pour, NP      . aspirin EC tablet 81 mg  81 mg Oral Daily Patrecia Pour, NP   81 mg at 01/11/20 0757  . diazepam (VALIUM) tablet 5 mg  5 mg Oral Daily PRN Salley Scarlet, MD      . DULoxetine (CYMBALTA) DR capsule 60 mg  60 mg Oral Daily Salley Scarlet, MD   60 mg at 01/11/20 0757  . feeding supplement (ENSURE ENLIVE / ENSURE PLUS) liquid 237 mL  237 mL Oral BID BM Salley Scarlet, MD   237 mL at 01/10/20 1837  . haloperidol (HALDOL) tablet 2 mg  2 mg Oral BID Patrecia Pour, NP   2 mg at 01/11/20 0757  . lisdexamfetamine (VYVANSE) capsule 40 mg  40 mg Oral Daily Patrecia Pour, NP   40 mg at 01/11/20 0757  . metFORMIN (GLUCOPHAGE) tablet 500 mg  500 mg Oral Q breakfast Patrecia Pour, NP   500 mg at 01/11/20 4010    Lab Results:  Results for orders placed or performed during the hospital encounter of 01/07/20 (from the past 48 hour(s))  Lipid panel     Status: Abnormal   Collection Time: 01/10/20  6:56 AM  Result Value Ref Range   Cholesterol 188 0 - 200 mg/dL   Triglycerides 162 (H) <150 mg/dL   HDL 31 (L) >40 mg/dL   Total CHOL/HDL Ratio 6.1 RATIO   VLDL 32 0 - 40 mg/dL   LDL Cholesterol 125 (H) 0 - 99 mg/dL    Comment:        Total Cholesterol/HDL:CHD Risk Coronary Heart Disease Risk Table                      Men   Women  1/2 Average Risk   3.4   3.3  Average Risk       5.0   4.4  2 X Average Risk   9.6   7.1  3 X Average Risk  23.4   11.0        Use the calculated Patient Ratio above and the CHD Risk Table to determine the patient's CHD Risk.        ATP III CLASSIFICATION (LDL):  <100     mg/dL   Optimal  100-129  mg/dL   Near or Above                    Optimal  130-159  mg/dL   Borderline  160-189  mg/dL   High  >190     mg/dL   Very High Performed at [facility], [address] [place], [place] [address]   Hemoglobin A1c     Status: Abnormal   Collection Time: 01/10/20  6:56 AM  Result Value Ref Range   Hgb A1c MFr Bld 5.7 (H) 4.8 - 5.6 %    Comment: (NOTE) Pre diabetes:          5.7%-6.4%  Diabetes:              >6.4%  Glycemic control for   <7.0%  adults with diabetes    Mean Plasma Glucose 116.89 mg/dL    Comment: Performed at [facility] [address], [place], [place] [address]    Blood Alcohol level:  Lab Results  Component Value Date   ETH <10 99/37/1696    Metabolic Disorder Labs: Lab Results  Component Value Date   HGBA1C 5.7 (H) 01/10/2020   MPG 116.89 01/10/2020   MPG 117 02/12/2016   No results found for: PROLACTIN Lab Results  Component Value Date   CHOL 188 01/10/2020   TRIG 162 (H) 01/10/2020   HDL 31 (L) 01/10/2020   CHOLHDL 6.1 01/10/2020   VLDL 32 01/10/2020   LDLCALC 125 (H) 01/10/2020   LDLCALC 80 11/01/2019    Physical Findings: AIMS: Facial and Oral Movements Muscles of Facial Expression: None, normal Lips and Perioral Area: None, normal Jaw: None, normal Tongue: None, normal,Extremity Movements Upper (arms, wrists, hands, fingers): None, normal Lower (legs, knees, ankles, toes): None, normal, Trunk Movements Neck, shoulders, hips: None, normal, Overall Severity Severity of abnormal movements (highest score from questions above): None, normal Incapacitation due to abnormal movements: None,  normal Patient's awareness of abnormal movements (rate only patient's report): No Awareness, Dental Status Current problems with teeth and/or dentures?: No Does patient usually wear dentures?: No  CIWA:    COWS:     Musculoskeletal: Strength & Muscle Tone: within normal limits Gait & Station: normal Patient leans: N/A  Psychiatric Specialty Exam: Physical Exam Vitals and nursing note reviewed.  Constitutional:      Appearance: Normal appearance.  HENT:     Head: Normocephalic and atraumatic.     Right Ear: External ear normal.     Left Ear: External ear normal.     Nose: Nose normal.     Mouth/Throat:     Mouth: Mucous membranes are moist.     Pharynx: Oropharynx is clear.  Eyes:     Extraocular Movements: Extraocular movements intact.     Conjunctiva/sclera: Conjunctivae normal.     Pupils: Pupils are equal, round, and reactive to light.  Cardiovascular:     Rate and Rhythm: Normal rate and regular rhythm.     Pulses: Normal pulses.     Heart sounds: Normal heart sounds.  Pulmonary:     Effort: Pulmonary effort is normal.     Breath sounds: Normal breath sounds.  Abdominal:     General: Abdomen is flat.     Palpations: Abdomen is soft.  Musculoskeletal:        General: No swelling. Normal range of motion.     Cervical back: Normal range of motion.  Skin:    General: Skin is warm and dry.  Neurological:     General: No focal deficit present.     Mental Status: She is alert and oriented to person, place, and time.  Psychiatric:        Attention and Perception: She is inattentive.        Mood and Affect: Mood is depressed. Affect is not tearful.        Speech: Speech is delayed.        Behavior: Behavior is slowed.        Thought Content: Thought content does not include suicidal ideation.        Cognition and Memory: Memory normal. Cognition is impaired.        Judgment: Judgment is impulsive.     Review of Systems  Constitutional: Positive for fatigue.  Negative for appetite  change.  HENT: Negative for nosebleeds and sore throat.   Eyes: Negative for photophobia and visual disturbance.  Respiratory: Negative for cough and shortness of breath.   Cardiovascular: Negative for chest pain and palpitations.  Gastrointestinal: Negative for constipation, diarrhea, nausea and vomiting.  Endocrine: Negative for cold intolerance and heat intolerance.  Genitourinary: Negative for difficulty urinating and dyspareunia.  Musculoskeletal: Negative for arthralgias and gait problem.  Skin: Negative for rash and wound.  Allergic/Immunologic: Positive for environmental allergies. Negative for food allergies.  Neurological: Negative for dizziness and headaches.  Hematological: Negative for adenopathy. Does not bruise/bleed easily.  Psychiatric/Behavioral: Positive for confusion, dysphoric mood and sleep disturbance. Negative for suicidal ideas. The patient is nervous/anxious.     Blood pressure 104/66, pulse 93, temperature 97.9 F (36.6 C), temperature source Oral, resp. rate 16, height 5\' 5"  (1.651 m), weight 79.8 kg, SpO2 97 %.Body mass index is 29.29 kg/m.  General Appearance: Disheveled  Eye Contact:  Good  Speech:  Clear and Coherent  Volume:  Normal  Mood:  Depressed and Dysphoric  Affect:  Congruent and Tearful  Thought Process:  Coherent  Orientation:  Full (Time, Place, and Person)  Thought Content:  Logical  Suicidal Thoughts:  Yes.  without intent/plan  Homicidal Thoughts:  No  Memory:  Immediate;   Fair Recent;   Poor Remote;   Poor  Judgement:  Impaired  Insight:  Present  Psychomotor Activity:  Decreased  Concentration:  Concentration: Fair and Attention Span: Fair  Recall:  [employer] of Knowledge:  Poor  Language:  Fair  Akathisia:  Negative  Handed:  Right  AIMS (if indicated):     Assets:  Desire for Improvement Financial Resources/Insurance Housing Resilience Social Support  ADL's:  Impaired  Cognition:  Impaired,   Mild  Sleep:  Number of Hours: 7     Treatment Plan Summary: Daily contact with patient to assess and evaluate symptoms and progress in treatment and Medication management  Major depressive disorder, recurrent, severe with psychosis: - Continue Cymbalta 60 mg daily - Haldol 2 mg BID for psychosis  Delirium: -Continue reduced dose of Valium 5 mg daily (previous 10 TID prior to admission) -Urinalysis with rare bacteria, urine culture with klebsiella pneumonia, incontinence on admission. Will treat with one time dose of fosfomycin.   Attention issues: -ContinueVyvanse 40 mg daily  Prediabetes - Continue metformin 500 mg with breakfast  Salley Scarlet, MD 01/11/2020, 12:16 PM

## 2020-01-11 NOTE — Plan of Care (Signed)
D- Patient alert and oriented to person and situation. Patient presents in a pleasant mood on assessment stating that she slept "so-so" last night and had some complaints of back pain. Patient rated her pain a "5/10", in which she requested PRN medication from this Probation officer. Patient denies SI, HI, AVH, at this time. Patient also denies any signs/symptoms of depression and anxiety, reporting that overall, she is feeling "pretty good". Patient had no stated goals for today.  A- Scheduled medications administered to patient, per MD orders. Support and encouragement provided.  Routine safety checks conducted every 15 minutes.  Patient informed to notify staff with problems or concerns.  R- No adverse drug reactions noted. Patient contracts for safety at this time. Patient compliant with medications. Patient receptive, calm, and cooperative. Patient interacts well with others on the unit.  Patient remains safe at this time.  Problem: Education: Goal: Knowledge of [facility] Education information/materials will improve Outcome: Progressing Goal: Emotional status will improve Outcome: Progressing Goal: Mental status will improve Outcome: Progressing Goal: Verbalization of understanding the information provided will improve Outcome: Progressing   Problem: Activity: Goal: Interest or engagement in activities will improve Outcome: Progressing Goal: Sleeping patterns will improve Outcome: Progressing   Problem: Coping: Goal: Ability to verbalize frustrations and anger appropriately will improve Outcome: Progressing Goal: Ability to demonstrate self-control will improve Outcome: Progressing   Problem: Health Behavior/Discharge Planning: Goal: Identification of resources available to assist in meeting health care needs will improve Outcome: Progressing Goal: Compliance with treatment plan for underlying cause of condition will improve Outcome: Progressing   Problem: Physical  Regulation: Goal: Ability to maintain clinical measurements within normal limits will improve Outcome: Progressing   Problem: Safety: Goal: Periods of time without injury will increase Outcome: Progressing   Problem: Activity: Goal: Will verbalize the importance of balancing activity with adequate rest periods Outcome: Progressing   Problem: Education: Goal: Will be free of psychotic symptoms Outcome: Progressing Goal: Knowledge of the prescribed therapeutic regimen will improve Outcome: Progressing   Problem: Coping: Goal: Coping ability will improve Outcome: Progressing Goal: Will verbalize feelings Outcome: Progressing   Problem: Health Behavior/Discharge Planning: Goal: Compliance with prescribed medication regimen will improve Outcome: Progressing   Problem: Nutritional: Goal: Ability to achieve adequate nutritional intake will improve Outcome: Progressing   Problem: Role Relationship: Goal: Ability to communicate needs accurately will improve Outcome: Progressing Goal: Ability to interact with others will improve Outcome: Progressing   Problem: Safety: Goal: Ability to redirect hostility and anger into socially appropriate behaviors will improve Outcome: Progressing Goal: Ability to remain free from injury will improve Outcome: Progressing   Problem: Self-Care: Goal: Ability to participate in self-care as condition permits will improve Outcome: Progressing   Problem: Self-Concept: Goal: Will verbalize positive feelings about self Outcome: Progressing

## 2020-01-11 NOTE — Plan of Care (Signed)
Patient denies SI/HI/AVH  Problem: Education: Goal: Emotional status will improve Outcome: Progressing Goal: Mental status will improve Outcome: Progressing

## 2020-01-11 NOTE — Progress Notes (Signed)
Recreation Therapy Notes  Date: 01/11/2020  Time: 9:30 am   Location: Craft room     Behavioral response: N/A   Intervention Topic: Social skills  Discussion/Intervention: Patient did not attend group.   Clinical Observations/Feedback:  Patient did not attend group.   Ginnette Gates LRT/CTRS         Becky Hubbard 01/11/2020 11:41 AM

## 2020-01-12 DIAGNOSIS — F333 Major depressive disorder, recurrent, severe with psychotic symptoms: Secondary | ICD-10-CM | POA: Diagnosis not present

## 2020-01-12 MED ORDER — HYDROXYZINE HCL 25 MG PO TABS
25.0000 mg | ORAL_TABLET | Freq: Three times a day (TID) | ORAL | Status: DC | PRN
  Administered 2020-01-16 – 2020-01-19 (×7): 25 mg via ORAL
  Filled 2020-01-12 (×8): qty 1

## 2020-01-12 NOTE — [facility] Counselor (Signed)
CSW attempted to meet with patient to dicsuss ACTT teams.  Patient asked for CSW to come back as she was sleeping.  Assunta Curtis, MSW, LCSW 01/12/2020 9:57 AM

## 2020-01-12 NOTE — Progress Notes (Signed)
D- Patient alert and oriented. Affect/mood is pleasant, somewhat worried,calm and cooperative. Pt denies SI, HI, AVH, and pain.   A- Scheduled medications administered to patient, per MD orders. Support and encouragement provided.  Routine safety checks conducted every 15 minutes.  Patient informed to notify staff with problems or concerns.  R- No adverse drug reactions noted. Patient contracts for safety at this time. Patient compliant with medications and treatment plan. Patient receptive, calm, and cooperative. Patient interacts well with others on the unit.  Patient remains safe at this time.  Collier Bullock RN
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
## 2020-01-12 NOTE — BHH Counselor (Signed)
CSW spoke with pt's mother, [name], mother, [phone].  Mother reports that she desires for the patient to go to a group home or ALF.  She reports understanding that pt can decline this.  CSW updated that pt is declining any aftercare recommendation besides Dr. Collie Siad at [facility].    CSW reported that she would ask for the weekend CSW to discuss with the patient Alf's and group homes to ascertain the patient's thoughts and this CSW will follow up in the next week.  Mother requested a follow up phone call.  Assunta Curtis, MSW, LCSW 01/12/2020 4:06 PM

## 2020-01-12 NOTE — Progress Notes (Signed)
Recreation Therapy Notes  Date: 01/12/2020  Time: 9:30 am   Location: Craft room     Behavioral response: N/A   Intervention Topic: Happiness   Discussion/Intervention: Patient did not attend group.   Clinical Observations/Feedback:  Patient did not attend group.   Jishnu Jenniges LRT/CTRS           Malin Sambrano 01/12/2020 11:16 AM

## 2020-01-12 NOTE — BHH Counselor (Signed)
CSW discussed an ACTT team with the patient.  CSW provided psycho-education on the service.  Patient declined stating that she wanted to continue with her current provider.  Assunta Curtis, MSW, LCSW 01/12/2020 1:24 PM

## 2020-01-12 NOTE — BHH Counselor (Signed)
CSW contacted [facility] in an effort to schedule the patient's aftercare appointment. Staff at [facility] note that patient has an appointment scheduled for 01/15/2020 at 3:40PM, next available appointment is 02/14/2019.  CSW stated concern that pt may d/c on that day and miss appointment.   Pt reports that she is okay with taking a later appointment time.    CSW attempted to contact the patient's mother to make her aware of the scheduled appointment, possible appointment and pending discharge.  CSW had to leave HIPAA compliant voicemail.   Assunta Curtis, MSW, LCSW 01/12/2020 1:34 PM

## 2020-01-12 NOTE — Plan of Care (Signed)
Pt rates depression 9/10 and anxiety 3/10. Pt denies SI, HI and AVG. Pt was educated on care plan and verbalizes understanding. Collier Bullock Rn Problem: Education: Goal: Knowledge of [facility] Education information/materials will improve Outcome: Progressing Goal: Emotional status will improve Outcome: Progressing Goal: Mental status will improve Outcome: Progressing Goal: Verbalization of understanding the information provided will improve Outcome: Progressing   Problem: Activity: Goal: Interest or engagement in activities will improve Outcome: Progressing Goal: Sleeping patterns will improve Outcome: Progressing   Problem: Coping: Goal: Ability to verbalize frustrations and anger appropriately will improve Outcome: Progressing Goal: Ability to demonstrate self-control will improve Outcome: Progressing   Problem: Health Behavior/Discharge Planning: Goal: Identification of resources available to assist in meeting health care needs will improve Outcome: Progressing Goal: Compliance with treatment plan for underlying cause of condition will improve Outcome: Progressing   Problem: Physical Regulation: Goal: Ability to maintain clinical measurements within normal limits will improve Outcome: Progressing   Problem: Safety: Goal: Periods of time without injury will increase Outcome: Progressing   Problem: Activity: Goal: Will verbalize the importance of balancing activity with adequate rest periods Outcome: Progressing   Problem: Education: Goal: Will be free of psychotic symptoms Outcome: Progressing Goal: Knowledge of the prescribed therapeutic regimen will improve Outcome: Progressing   Problem: Coping: Goal: Coping ability will improve Outcome: Progressing Goal: Will verbalize feelings Outcome: Progressing   Problem: Health Behavior/Discharge Planning: Goal: Compliance with prescribed medication regimen will improve Outcome: Progressing   Problem:  Nutritional: Goal: Ability to achieve adequate nutritional intake will improve Outcome: Progressing   Problem: Role Relationship: Goal: Ability to communicate needs accurately will improve Outcome: Progressing Goal: Ability to interact with others will improve Outcome: Progressing   Problem: Safety: Goal: Ability to redirect hostility and anger into socially appropriate behaviors will improve Outcome: Progressing Goal: Ability to remain free from injury will improve Outcome: Progressing   Problem: Self-Care: Goal: Ability to participate in self-care as condition permits will improve Outcome: Progressing   Problem: Self-Concept: Goal: Will verbalize positive feelings about self Outcome: Progressing

## 2020-01-13 DIAGNOSIS — F333 Major depressive disorder, recurrent, severe with psychotic symptoms: Secondary | ICD-10-CM | POA: Diagnosis not present

## 2020-01-13 NOTE — Progress Notes (Signed)
Pt is alert and oriented to person, place, time and situation. Pt is calm, cooperative, denies suicidal and homicidal ideation, denies hallucinations. Pt is childlike, tearful at times, reports her mood is "sad" rates her feelings of depression and anxiety both a 3/10 on a 0-10 scale, 10 being worst. When asked why pt is tearful, pt states, "Because I miss my mom." Pt reports she did not sleep well, reports she kept waking in the night. Pt denies pain. Pt is up for breakfast and medication complaint. After breakfast pt rests quietly in bed. Pt has a history of incontinence, none noted thus far. Will continue to monitor pt per Q15 minute face checks and monitor for safety and progress.

## 2020-01-13 NOTE — BHH Group Notes (Signed)
[facility] Group Notes: (Clinical Social Work)   01/13/2020      Type of Therapy:  Group Therapy   Participation Level:  Did Not Attend - was invited individually by Nurse/MHT and chose not to attend.   Raina Mina, [place] 01/13/2020  4:58 PM

## 2020-01-13 NOTE — Progress Notes (Signed)
Patient wandering around the unit. Mildly confused and child-like. Denies SI, Hi and AVH

## 2020-01-13 NOTE — Progress Notes (Signed)
[facility] MD Progress Note  01/13/2020 4:51 PM Becky Hubbard  MRN:  [id number]   Subjective and interval history-patient denies any complaints, the nurse reported that patient is confused at times guarded and withdrawn and do not talk much   Becky Hubbard a 49 y.o.femalepatient admitted with psychosis.She has a history of MDD with psychosis, ADD, head injury from [place] in 2008, and PTSD. Prior to admission she stopped performing ADLs, and behaving bizarrely. No acute events overnight. She has been medication compliant during admission.   Patient was sitting in her bed, oriented x2, patient denies any complaints, makes limited eye contact, withdrawn and guarded, gives short answers, denies any hallucinations, denies depression, denies suicidal or homicidal thoughts intent or plan. Patient reportedly cooperative with care taking medications tolerating well, slow well last night.     Principal Problem: Major depressive disorder, recurrent episode, severe, with psychosis ([place]) Diagnosis: Principal Problem:   Major depressive disorder, recurrent episode, severe, with psychosis ([place]) Active Problems:   Fibromyalgia syndrome   Auditory hallucinations   PTSD (post-traumatic stress disorder)   ADD (attention deficit disorder)   Borderline diabetes  Total Time spent with patient: 30 minutes  Past Psychiatric History: Previously seen by [facility] unit by Dr. Kasandra Knudsen, prescribed Cymbalta 30 mg daily, Valium 10 mg TID, Vyvanse 40 mg daily. No prior inpatient admissions. No previous suicide attempts.   Past Medical History:  Past Medical History:  Diagnosis Date  . ADD (attention deficit disorder)   . Anxiety   . Arthritis   . Auditory hallucinations   . Carpal tunnel syndrome on both sides   . Cervical spine fracture ([place]) 2008   Closed with screw C2-3 [facility] Dr. Delilah Shan  . Degenerative joint disease of spine   . Depression   . Diabetes mellitus without complication ([place])   . Family  history of adverse reaction to anesthesia    mom- slow to awaken  . Fibromyalgia   . Foot fracture, left   . Gastroparesis   . Gastroparesis   . GERD (gastroesophageal reflux disease)   . Hearing loss   . Herniated cervical disc   . History of prescription drug abuse   . Neuromuscular disorder (HCC)    fibromyalgia/chronic fatique syndrome  . Neuropathy of foot   . OCD (obsessive compulsive disorder)   . 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 Plaque psoriasis   . PTSD (post-traumatic stress disorder)   . Stroke ([place])   . Ulcer     Past Surgical History:  Procedure Laterality Date  . CARPAL TUNNEL RELEASE Left 07/12/2015   Procedure: OPEN CARPAL TUNNEL RELEASE OF LEFT HAND;  Surgeon: Leanor Kail, MD;  Location: [place];  Service: Orthopedics;  Laterality: Left;  . cervical radiofrequency neurotomy    . FRACTURE SURGERY    . MOUTH SURGERY     upper and lower dentures  . NASAL SEPTUM SURGERY    . occipital nerve blocks    . SPINE SURGERY     cervical   Family History:  Family History  Problem Relation Age of Onset  . Cancer Mother 65       breast cancer  . Migraines Mother   . Hypertension Mother   . Arthritis Mother   . Sleep apnea Mother   . Anxiety disorder Mother   . Depression Mother   .  Diabetes Father   . Cancer Father 28       of spine , pancreas and liver  . Anxiety disorder Father   . Depression Father   . Diabetes Brother   . Hyperlipidemia Brother   . Sleep apnea Brother   . Cancer Maternal Grandfather        leukemia, prostate  . Cancer Paternal Grandfather        bone  . Cancer Maternal Aunt        breast  . Mental illness Maternal Grandmother        Dementia  . Stroke Paternal Grandmother   . Diabetes Brother   . Cancer Paternal Aunt        breast   Family Psychiatric  History: Mother with depression and  anxiety Social History:  Social History   Substance and Sexual Activity  Alcohol Use No     Social History   Substance and Sexual Activity  Drug Use No    Social History   Socioeconomic History  . Marital status: Divorced    Spouse name: Not on file  . Number of children: Not on file  . Years of education: Not on file  . Highest education level: Not on file  Occupational History  . Not on file  Tobacco Use  . Smoking status: Current Every Day Smoker    Packs/day: 1.00    Years: 20.00    Pack years: 20.00    Types: Cigarettes    Start date: 03/01/2011  . Smokeless tobacco: Never Used  Vaping Use  . Vaping Use: Never used  Substance and Sexual Activity  . Alcohol use: No  . Drug use: No  . Sexual activity: Yes    Birth control/protection: None  Other Topics Concern  . Not on file  Social History Narrative   Depression- history of suicide attempt and hospitalization about 1998 after divorce   H/O hospitalization at [facility] for narcotics detox/withdrawal approx. 2000   Social Determinants of Health   Financial Resource Strain:   . Difficulty of Paying Living Expenses: Not on file  Food Insecurity:   . Worried About Charity fundraiser in the Last Year: Not on file  . Ran Out of Food in the Last Year: Not on file  Transportation Needs:   . Lack of Transportation (Medical): Not on file  . Lack of Transportation (Non-Medical): Not on file  Physical Activity:   . Days of Exercise per Week: Not on file  . Minutes of Exercise per Session: Not on file  Stress:   . Feeling of Stress : Not on file  Social Connections:   . Frequency of Communication with Friends and Family: Not on file  . Frequency of Social Gatherings with Friends and Family: Not on file  . Attends Religious Services: Not on file  . Active Member of Clubs or Organizations: Not on file  . Attends Archivist Meetings: Not on file  . Marital Status: Not on file   Additional  Social History:                         Sleep: Fair  Appetite:  Fair  Current Medications: Current Facility-Administered Medications  Medication Dose Route Frequency Provider Last Rate Last Admin  . acetaminophen (TYLENOL) tablet 650 mg  650 mg Oral Q6H PRN Patrecia Pour, NP   650 mg at 01/11/20 0757  . alum & mag hydroxide-simeth (MAALOX/MYLANTA)  200-200-20 MG/5ML suspension 30 mL  30 mL Oral Q4H PRN Patrecia Pour, NP      . aspirin EC tablet 81 mg  81 mg Oral Daily Patrecia Pour, NP   81 mg at 01/13/20 8938  . DULoxetine (CYMBALTA) DR capsule 60 mg  60 mg Oral Daily Salley Scarlet, MD   60 mg at 01/13/20 0837  . feeding supplement (ENSURE ENLIVE / ENSURE PLUS) liquid 237 mL  237 mL Oral BID BM Salley Scarlet, MD   237 mL at 01/12/20 1717  . haloperidol (HALDOL) tablet 2 mg  2 mg Oral BID Patrecia Pour, NP   2 mg at 01/13/20 1017  . hydrOXYzine (ATARAX/VISTARIL) tablet 25 mg  25 mg Oral TID PRN Salley Scarlet, MD      . lisdexamfetamine (VYVANSE) capsule 40 mg  40 mg Oral Daily Patrecia Pour, NP   40 mg at 01/13/20 5102  . metFORMIN (GLUCOPHAGE) tablet 500 mg  500 mg Oral Q breakfast Patrecia Pour, NP   500 mg at 01/13/20 5852    Lab Results:  No results found for this or any previous visit (from the past 48 hour(s)).  Blood Alcohol level:  Lab Results  Component Value Date   ETH <10 77/82/4235    Metabolic Disorder Labs: Lab Results  Component Value Date   HGBA1C 5.7 (H) 01/10/2020   MPG 116.89 01/10/2020   MPG 117 02/12/2016   No results found for: PROLACTIN Lab Results  Component Value Date   CHOL 188 01/10/2020   TRIG 162 (H) 01/10/2020   HDL 31 (L) 01/10/2020   CHOLHDL 6.1 01/10/2020   VLDL 32 01/10/2020   LDLCALC 125 (H) 01/10/2020   LDLCALC 80 11/01/2019    Physical Findings: AIMS: Facial and Oral Movements Muscles of Facial Expression: None, normal Lips and Perioral Area: None, normal Jaw: None, normal Tongue: None,  normal,Extremity Movements Upper (arms, wrists, hands, fingers): None, normal Lower (legs, knees, ankles, toes): None, normal, Trunk Movements Neck, shoulders, hips: None, normal, Overall Severity Severity of abnormal movements (highest score from questions above): None, normal Incapacitation due to abnormal movements: None, normal Patient's awareness of abnormal movements (rate only patient's report): No Awareness, Dental Status Current problems with teeth and/or dentures?: No Does patient usually wear dentures?: No  CIWA:    COWS:     Musculoskeletal: Strength & Muscle Tone: within normal limits Gait & Station: normal Patient leans: N/A  Psychiatric Specialty Exam: Physical Exam Vitals and nursing note reviewed.  Constitutional:      Appearance: Normal appearance.  HENT:     Head: Normocephalic and atraumatic.     Right Ear: External ear normal.     Left Ear: External ear normal.     Nose: Nose normal.     Mouth/Throat:     Mouth: Mucous membranes are moist.     Pharynx: Oropharynx is clear.  Eyes:     Extraocular Movements: Extraocular movements intact.     Conjunctiva/sclera: Conjunctivae normal.  Cardiovascular:     Rate and Rhythm: Normal rate and regular rhythm.     Pulses: Normal pulses.  Pulmonary:     Effort: Pulmonary effort is normal.     Breath sounds: Normal breath sounds.  Abdominal:     General: Abdomen is flat.  Musculoskeletal:        General: No swelling. Normal range of motion.     Cervical back: Normal range of motion.  Skin:  General: Skin is dry.  Neurological:     General: No focal deficit present.     Mental Status: She is alert and oriented to person, place, and time.  Psychiatric:        Attention and Perception: She is inattentive.        Mood and Affect: Mood is depressed. Affect is not tearful.        Speech: Speech normal.        Behavior: Behavior is slowed.        Thought Content: Thought content does not include suicidal  ideation.        Cognition and Memory: Cognition and memory normal.        Judgment: Judgment is impulsive.     Review of Systems  Constitutional: Positive for fatigue. Negative for appetite change.  HENT: Negative for nosebleeds and sore throat.   Eyes: Negative for photophobia and visual disturbance.  Respiratory: Negative for cough and shortness of breath.   Cardiovascular: Negative for chest pain and palpitations.  Gastrointestinal: Negative for constipation, diarrhea, nausea and vomiting.  Endocrine: Negative for cold intolerance and heat intolerance.  Genitourinary: Negative for difficulty urinating and dyspareunia.  Musculoskeletal: Negative for arthralgias and gait problem.  Skin: Negative for rash and wound.  Allergic/Immunologic: Positive for environmental allergies. Negative for food allergies.  Neurological: Negative for dizziness and headaches.  Hematological: Negative for adenopathy. Does not bruise/bleed easily.  Psychiatric/Behavioral: Positive for confusion, dysphoric mood and sleep disturbance. Negative for suicidal ideas. The patient is nervous/anxious.     Blood pressure 108/81, pulse 95, temperature 98.4 F (36.9 C), temperature source Oral, resp. rate 18, height 5\' 5"  (1.651 m), weight 79.8 kg, SpO2 98 %.Body mass index is 29.29 kg/m.  General Appearance: Disheveled  Eye Contact: Limited  Speech:  Clear and Coherent, short answers  Volume:  Normal  Mood:  Depressed and Dysphoric  Affect: Restricted  Thought Process:  Coherent  Orientation:  Ox2, alert  Thought Content: [place]  Suicidal Thoughts:  No  Homicidal Thoughts:  No  Memory:  Immediate;   Fair Recent;   Poor Remote;   Poor  Judgement:  Impaired  Insight:  Present  Psychomotor Activity:  Decreased  Concentration:  Concentration: Fair and Attention Span: Fair  Recall:  [employer] of Knowledge:  Poor  Language:  Fair  Akathisia:  Negative  Handed:  Right  AIMS (if indicated):     Assets:   Desire for Improvement Financial Resources/Insurance Housing Resilience Social Support  ADL's:  Impaired  Cognition:  Impaired,  Mild  Sleep:  Number of Hours: 8.25     Treatment Plan Summary: Daily contact with patient to assess and evaluate symptoms and progress in treatment and Medication management Depressive and psychotic features Delirium: Resolving- -Valium taper completed, discontinue today 01/12/20 -Klebsiella pneumoniae UTI noted on admission. Treated with one time dose of fosfomycin on 01/11/20.   Major depressive disorder, recurrent, severe with psychosis: - Continue Cymbalta 60 mg daily - Haldol 2 mg BID for psychosis  Attention issues: -ContinueVyvanse 40 mg daily  Prediabetes - Continue metformin 500 mg with breakfast  Lenward Chancellor, MD 01/13/2020, 4:51 PMPatient ID: Becky Hubbard, female   DOB: 08-05-1970, 49 y.o.   MRN: [id number]

## 2020-01-13 NOTE — Plan of Care (Signed)
  Problem: Education: Goal: Knowledge of  General Education information/materials will improve Outcome: Not Progressing Goal: Emotional status will improve Outcome: Not Progressing Goal: Mental status will improve Outcome: Not Progressing Goal: Verbalization of understanding the information provided will improve Outcome: Not Progressing   Problem: Activity: Goal: Interest or engagement in activities will improve Outcome: Not Progressing Goal: Sleeping patterns will improve Outcome: Not Progressing   Problem: Coping: Goal: Ability to verbalize frustrations and anger appropriately will improve Outcome: Not Progressing Goal: Ability to demonstrate self-control will improve Outcome: Not Progressing   Problem: Health Behavior/Discharge Planning: Goal: Identification of resources available to assist in meeting health care needs will improve Outcome: Not Progressing Goal: Compliance with treatment plan for underlying cause of condition will improve Outcome: Not Progressing   Problem: Physical Regulation: Goal: Ability to maintain clinical measurements within normal limits will improve Outcome: Not Progressing   Problem: Safety: Goal: Periods of time without injury will increase Outcome: Not Progressing   Problem: Activity: Goal: Will verbalize the importance of balancing activity with adequate rest periods Outcome: Not Progressing   Problem: Education: Goal: Will be free of psychotic symptoms Outcome: Not Progressing Goal: Knowledge of the prescribed therapeutic regimen will improve Outcome: Not Progressing   Problem: Coping: Goal: Coping ability will improve Outcome: Not Progressing Goal: Will verbalize feelings Outcome: Not Progressing   Problem: Health Behavior/Discharge Planning: Goal: Compliance with prescribed medication regimen will improve Outcome: Not Progressing   Problem: Nutritional: Goal: Ability to achieve adequate nutritional intake will  improve Outcome: Not Progressing   Problem: Role Relationship: Goal: Ability to communicate needs accurately will improve Outcome: Not Progressing Goal: Ability to interact with others will improve Outcome: Not Progressing   Problem: Safety: Goal: Ability to redirect hostility and anger into socially appropriate behaviors will improve Outcome: Not Progressing Goal: Ability to remain free from injury will improve Outcome: Not Progressing   Problem: Self-Care: Goal: Ability to participate in self-care as condition permits will improve Outcome: Not Progressing   Problem: Self-Concept: Goal: Will verbalize positive feelings about self Outcome: Not Progressing

## 2020-01-13 NOTE — Tx Team (Signed)
Interdisciplinary Treatment and Diagnostic Plan Update  01/13/2020 Time of Session: 10:00 AM  Becky Hubbard MRN: [id number]  Principal Diagnosis: Major depressive disorder, recurrent episode, severe, with psychosis ([place])  Secondary Diagnoses: Principal Problem:   Major depressive disorder, recurrent episode, severe, with psychosis ([place]) Active Problems:   Fibromyalgia syndrome   Auditory hallucinations   PTSD (post-traumatic stress disorder)   ADD (attention deficit disorder)   Borderline diabetes   Current Medications:  Current Facility-Administered Medications  Medication Dose Route Frequency Provider Last Rate Last Admin  . acetaminophen (TYLENOL) tablet 650 mg  650 mg Oral Q6H PRN Patrecia Pour, NP   650 mg at 01/11/20 0757  . alum & mag hydroxide-simeth (MAALOX/MYLANTA) 200-200-20 MG/5ML suspension 30 mL  30 mL Oral Q4H PRN Patrecia Pour, NP      . aspirin EC tablet 81 mg  81 mg Oral Daily Patrecia Pour, NP   81 mg at 01/13/20 8563  . DULoxetine (CYMBALTA) DR capsule 60 mg  60 mg Oral Daily Salley Scarlet, MD   60 mg at 01/13/20 0837  . feeding supplement (ENSURE ENLIVE / ENSURE PLUS) liquid 237 mL  237 mL Oral BID BM Salley Scarlet, MD   237 mL at 01/12/20 1717  . haloperidol (HALDOL) tablet 2 mg  2 mg Oral BID Patrecia Pour, NP   2 mg at 01/13/20 1497  . hydrOXYzine (ATARAX/VISTARIL) tablet 25 mg  25 mg Oral TID PRN Salley Scarlet, MD      . lisdexamfetamine (VYVANSE) capsule 40 mg  40 mg Oral Daily Patrecia Pour, NP   40 mg at 01/13/20 0263  . metFORMIN (GLUCOPHAGE) tablet 500 mg  500 mg Oral Q breakfast Patrecia Pour, NP   500 mg at 01/13/20 7858   PTA Medications: Medications Prior to Admission  Medication Sig Dispense Refill Last Dose  . aspirin 81 MG tablet Take 81 mg by mouth daily.     . busPIRone (BUSPAR) 15 MG tablet Take 15 mg by mouth 2 (two) times daily.      . cyclobenzaprine (FLEXERIL) 10 MG tablet Take 5-10 mg by mouth at bedtime as needed for  muscle spasms.     . diazepam (VALIUM) 10 MG tablet Take 10 mg by mouth 3 (three) times daily as needed for anxiety.   1   . DULoxetine (CYMBALTA) 60 MG capsule Take 60 mg by mouth 2 (two) times daily.   4   . EPINEPHrine 0.3 mg/0.3 mL IJ SOAJ injection Inject 0.3 mg into the muscle as needed for anaphylaxis. 1 each 0   . etonogestrel (NEXPLANON) 68 MG IMPL implant 1 each (68 mg total) by Subdermal route once for 1 dose. 1 each 0   . fluticasone (FLONASE) 50 MCG/ACT nasal spray Place 2 sprays into both nostrils daily.      . haloperidol (HALDOL) 2 MG tablet Take 2 mg by mouth at bedtime.      . metFORMIN (GLUCOPHAGE) 500 MG tablet Take 500 mg by mouth daily.      . pantoprazole (PROTONIX) 40 MG tablet TAKE ONE TABLET EVERY DAY (Patient taking differently: Take 40 mg by mouth daily. ) 30 tablet 3   . prazosin (MINIPRESS) 2 MG capsule Take 2 mg by mouth at bedtime.     Marland Kitchen PROAIR HFA 108 (90 Base) MCG/ACT inhaler Inhale 2 puffs into the lungs every 6 (six) hours as needed for wheezing or shortness of breath.      Marland Kitchen  VYVANSE 40 MG capsule Take 40 mg by mouth every morning.     . ziprasidone (GEODON) 40 MG capsule Take 40 mg by mouth daily with supper.     . zolpidem (AMBIEN) 10 MG tablet Take 10 mg by mouth at bedtime.       Patient Stressors: Health problems  Patient Strengths: Supportive family/friends  Treatment Modalities: Medication Management, Group therapy, Case management,  1 to 1 session with clinician, Psychoeducation, Recreational therapy.   Physician Treatment Plan for Primary Diagnosis: Major depressive disorder, recurrent episode, severe, with psychosis ([place]) Long Term Goal(s): Improvement in symptoms so as ready for discharge Improvement in symptoms so as ready for discharge   Short Term Goals: Ability to identify changes in lifestyle to reduce recurrence of condition will improve Ability to verbalize feelings will improve Ability to disclose and discuss suicidal ideas Ability  to demonstrate self-control will improve Ability to identify and develop effective coping behaviors will improve Compliance with prescribed medications will improve Ability to identify changes in lifestyle to reduce recurrence of condition will improve Ability to verbalize feelings will improve Ability to disclose and discuss suicidal ideas Ability to demonstrate self-control will improve Ability to identify and develop effective coping behaviors will improve Compliance with prescribed medications will improve  Medication Management: Evaluate patient's response, side effects, and tolerance of medication regimen.  Therapeutic Interventions: 1 to 1 sessions, Unit Group sessions and Medication administration.  Evaluation of Outcomes: Progressing  Physician Treatment Plan for Secondary Diagnosis: Principal Problem:   Major depressive disorder, recurrent episode, severe, with psychosis ([place]) Active Problems:   Fibromyalgia syndrome   Auditory hallucinations   PTSD (post-traumatic stress disorder)   ADD (attention deficit disorder)   Borderline diabetes  Long Term Goal(s): Improvement in symptoms so as ready for discharge Improvement in symptoms so as ready for discharge   Short Term Goals: Ability to identify changes in lifestyle to reduce recurrence of condition will improve Ability to verbalize feelings will improve Ability to disclose and discuss suicidal ideas Ability to demonstrate self-control will improve Ability to identify and develop effective coping behaviors will improve Compliance with prescribed medications will improve Ability to identify changes in lifestyle to reduce recurrence of condition will improve Ability to verbalize feelings will improve Ability to disclose and discuss suicidal ideas Ability to demonstrate self-control will improve Ability to identify and develop effective coping behaviors will improve Compliance with prescribed medications will improve      Medication Management: Evaluate patient's response, side effects, and tolerance of medication regimen.  Therapeutic Interventions: 1 to 1 sessions, Unit Group sessions and Medication administration.  Evaluation of Outcomes: Progressing   RN Treatment Plan for Primary Diagnosis: Major depressive disorder, recurrent episode, severe, with psychosis ([place]) Long Term Goal(s): Knowledge of disease and therapeutic regimen to maintain health will improve  Short Term Goals: Ability to demonstrate self-control, Ability to participate in decision making will improve, Ability to verbalize feelings will improve, Ability to identify and develop effective coping behaviors will improve and Compliance with prescribed medications will improve  Medication Management: RN will administer medications as ordered by provider, will assess and evaluate patient's response and provide education to patient for prescribed medication. RN will report any adverse and/or side effects to prescribing provider.  Therapeutic Interventions: 1 on 1 counseling sessions, Psychoeducation, Medication administration, Evaluate responses to treatment, Monitor vital signs and CBGs as ordered, Perform/monitor CIWA, COWS, AIMS and Fall Risk screenings as ordered, Perform wound care treatments as ordered.  Evaluation of Outcomes: Progressing  LCSW Treatment Plan for Primary Diagnosis: Major depressive disorder, recurrent episode, severe, with psychosis ([place]) Long Term Goal(s): Safe transition to appropriate next level of care at discharge, Engage patient in therapeutic group addressing interpersonal concerns.  Short Term Goals: Engage patient in aftercare planning with referrals and resources, Increase social support, Increase ability to appropriately verbalize feelings, Increase emotional regulation, Facilitate acceptance of mental health diagnosis and concerns, Identify triggers associated with mental health/substance abuse issues and  Increase skills for wellness and recovery  Therapeutic Interventions: Assess for all discharge needs, 1 to 1 time with Social worker, Explore available resources and support systems, Assess for adequacy in community support network, Educate family and significant other(s) on suicide prevention, Complete Psychosocial Assessment, Interpersonal group therapy.  Evaluation of Outcomes: Progressing   Progress in Treatment: Attending groups: No. Participating in groups: No. Taking medication as prescribed: Yes. Toleration medication: Yes. Family/Significant other contact made: Yes, individual(s) contacted:  [name], Mother  Patient understands diagnosis: No. Discussing patient identified problems/goals with staff: No. Medical problems stabilized or resolved: Yes. Denies suicidal/homicidal ideation: Yes. Issues/concerns per patient self-inventory: No. Other: None   New problem(s) identified: No, Describe:  None   New Short Term/Long Term Goal(s): Elimination of symptoms of psychosis, medication management for mood stabilization; development of comprehensive mental wellness plan. Update 01/13/20: No changes at this time   Patient Goals:  "I want to go home."  Update 01/13/20: No changes at this time   Discharge Plan or Barriers: CSW will assist pt with development of aftercare plan. Update 01/13/20: No changes at this time   Reason for Continuation of Hospitalization: Delusions  Medication stabilization  Estimated Length of Stay: 1-7 days   Attendees: Patient: 01/13/2020 5:23 PM  Physician: Dr. Alonna Minium, MD 01/13/2020 5:23 PM  Nursing:  01/13/2020 5:23 PM  RN Care Manager: 01/13/2020 5:23 PM  Social Worker: Raina Mina, [place] 01/13/2020 5:23 PM  Recreational Therapist:  01/13/2020 5:23 PM  Other:  01/13/2020 5:23 PM  Other:  01/13/2020 5:23 PM  Other: 01/13/2020 5:23 PM    Scribe for Treatment Team: Raina Mina, [place] 01/13/2020 5:23 PM

## 2020-01-14 DIAGNOSIS — F333 Major depressive disorder, recurrent, severe with psychotic symptoms: Secondary | ICD-10-CM | POA: Diagnosis not present

## 2020-01-14 NOTE — Progress Notes (Signed)
Pt is alert and oriented to person, place, time and situation. Pt denies suicidal and homicidal ideation, denies hallucinations, denies feelings of depression and anxiety. Pt is calm, cooperative, smiles on contact, is medication compliant, visible in the dayroom, appetite is good, reports she slept well. No distress noted and none reported. Pt voices no complaints. Will continue to monitor pt per Q15 minute face checks and monitor for safety and progress.

## 2020-01-14 NOTE — Progress Notes (Signed)
[facility] MD Progress Note  01/14/2020 12:08 PM [name]  MRN:  [id number]   Subjective and interval history- I'm doing fine"  Becky Hubbard a 49 y.o.femalepatient admitted with psychosis.She has a history of MDD with psychosis, ADD, head injury from [place] in 2008, and PTSD. Prior to admission she stopped performing ADLs, and behaving bizarrely. No acute events overnight. She has been medication compliant during admission.   Patient seen today for follow-up , chart reviewed discussed with nursing staff , patient reportedly confused and wanders at times but mostly isolating in her room . patient was sitting in her bed, oriented x2, patient denies any complaints, makes limited eye contact, withdrawn and guarded, gives short answers, denies any hallucinations, denies depression, denies suicidal or homicidal thoughts intent or plan. Patient  cooperative with care taking medications tolerating well, slow well last night.     Principal Problem: Major depressive disorder, recurrent episode, severe, with psychosis ([place]) Diagnosis: Principal Problem:   Major depressive disorder, recurrent episode, severe, with psychosis ([place]) Active Problems:   Fibromyalgia syndrome   Auditory hallucinations   PTSD (post-traumatic stress disorder)   ADD (attention deficit disorder)   Borderline diabetes  Total Time spent with patient: 30 minutes  Past Psychiatric History: Previously seen by [facility] unit by Dr. Kasandra Knudsen, prescribed Cymbalta 30 mg daily, Valium 10 mg TID, Vyvanse 40 mg daily. No prior inpatient admissions. No previous suicide attempts.   Past Medical History:  Past Medical History:  Diagnosis Date  . ADD (attention deficit disorder)   . Anxiety   . Arthritis   . Auditory hallucinations   . Carpal tunnel syndrome on both sides   . Cervical spine fracture ([place]) 2008   Closed with screw C2-3 [facility] Dr. Delilah Shan  . Degenerative joint disease of spine   . Depression   . Diabetes mellitus  without complication ([place])   . Family history of adverse reaction to anesthesia    mom- slow to awaken  . Fibromyalgia   . Foot fracture, left   . Gastroparesis   . Gastroparesis   . GERD (gastroesophageal reflux disease)   . Hearing loss   . Herniated cervical disc   . History of prescription drug abuse   . Neuromuscular disorder (H[facility])    fibromyalgia/chronic fatique syndrome  . Neuropathy of foot   . OCD (obsessive compulsive disorder)   . 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 Plaque psoriasis   . PTSD (post-traumatic stress disorder)   . Stroke ([place])   . Ulcer     Past Surgical History:  Procedure Laterality Date  . CARPAL TUNNEL RELEASE Left 07/12/2015   Procedure: OPEN CARPAL TUNNEL RELEASE OF LEFT HAND;  Surgeon: Leanor Kail, MD;  Location: [place];  Service: Orthopedics;  Laterality: Left;  . cervical radiofrequency neurotomy    . FRACTURE SURGERY    . MOUTH SURGERY     upper and lower dentures  . NASAL SEPTUM SURGERY    . occipital nerve blocks    . SPINE SURGERY     cervical   Family History:  Family History  Problem Relation Age of Onset  . Cancer Mother 51       breast cancer  . Migraines Mother   . Hypertension Mother   . Arthritis Mother   . Sleep apnea Mother   . Anxiety  disorder Mother   . Depression Mother   . Diabetes Father   . Cancer Father 61       of spine , pancreas and liver  . Anxiety disorder Father   . Depression Father   . Diabetes Brother   . Hyperlipidemia Brother   . Sleep apnea Brother   . Cancer Maternal Grandfather        leukemia, prostate  . Cancer Paternal Grandfather        bone  . Cancer Maternal Aunt        breast  . Mental illness Maternal Grandmother        Dementia  . Stroke Paternal Grandmother   . Diabetes Brother   . Cancer Paternal Aunt        breast   Family Psychiatric  History:  Mother with depression and anxiety Social History:  Social History   Substance and Sexual Activity  Alcohol Use No     Social History   Substance and Sexual Activity  Drug Use No    Social History   Socioeconomic History  . Marital status: Divorced    Spouse name: Not on file  . Number of children: Not on file  . Years of education: Not on file  . Highest education level: Not on file  Occupational History  . Not on file  Tobacco Use  . Smoking status: Current Every Day Smoker    Packs/day: 1.00    Years: 20.00    Pack years: 20.00    Types: Cigarettes    Start date: 03/01/2011  . Smokeless tobacco: Never Used  Vaping Use  . Vaping Use: Never used  Substance and Sexual Activity  . Alcohol use: No  . Drug use: No  . Sexual activity: Yes    Birth control/protection: None  Other Topics Concern  . Not on file  Social History Narrative   Depression- history of suicide attempt and hospitalization about 1998 after divorce   H/O hospitalization at [facility] for narcotics detox/withdrawal approx. 2000   Social Determinants of Health   Financial Resource Strain:   . Difficulty of Paying Living Expenses: Not on file  Food Insecurity:   . Worried About Charity fundraiser in the Last Year: Not on file  . Ran Out of Food in the Last Year: Not on file  Transportation Needs:   . Lack of Transportation (Medical): Not on file  . Lack of Transportation (Non-Medical): Not on file  Physical Activity:   . Days of Exercise per Week: Not on file  . Minutes of Exercise per Session: Not on file  Stress:   . Feeling of Stress : Not on file  Social Connections:   . Frequency of Communication with Friends and Family: Not on file  . Frequency of Social Gatherings with Friends and Family: Not on file  . Attends Religious Services: Not on file  . Active Member of Clubs or Organizations: Not on file  . Attends Archivist Meetings: Not on file  . Marital Status: Not  on file   Additional Social History:                         Sleep: Fair  Appetite:  Fair  Current Medications: Current Facility-Administered Medications  Medication Dose Route Frequency Provider Last Rate Last Admin  . acetaminophen (TYLENOL) tablet 650 mg  650 mg Oral Q6H PRN Patrecia Pour, NP   650 mg  at 01/11/20 0757  . alum & mag hydroxide-simeth (MAALOX/MYLANTA) 200-200-20 MG/5ML suspension 30 mL  30 mL Oral Q4H PRN Patrecia Pour, NP      . aspirin EC tablet 81 mg  81 mg Oral Daily Patrecia Pour, NP   81 mg at 01/14/20 2130  . DULoxetine (CYMBALTA) DR capsule 60 mg  60 mg Oral Daily Salley Scarlet, MD   60 mg at 01/14/20 8657  . feeding supplement (ENSURE ENLIVE / ENSURE PLUS) liquid 237 mL  237 mL Oral BID BM Salley Scarlet, MD   237 mL at 01/12/20 1717  . haloperidol (HALDOL) tablet 2 mg  2 mg Oral BID Patrecia Pour, NP   2 mg at 01/14/20 8469  . hydrOXYzine (ATARAX/VISTARIL) tablet 25 mg  25 mg Oral TID PRN Salley Scarlet, MD      . lisdexamfetamine (VYVANSE) capsule 40 mg  40 mg Oral Daily Patrecia Pour, NP   40 mg at 01/14/20 6295  . metFORMIN (GLUCOPHAGE) tablet 500 mg  500 mg Oral Q breakfast Patrecia Pour, NP   500 mg at 01/14/20 2841    Lab Results:  No results found for this or any previous visit (from the past 48 hour(s)).  Blood Alcohol level:  Lab Results  Component Value Date   ETH <10 32/44/0102    Metabolic Disorder Labs: Lab Results  Component Value Date   HGBA1C 5.7 (H) 01/10/2020   MPG 116.89 01/10/2020   MPG 117 02/12/2016   No results found for: PROLACTIN Lab Results  Component Value Date   CHOL 188 01/10/2020   TRIG 162 (H) 01/10/2020   HDL 31 (L) 01/10/2020   CHOLHDL 6.1 01/10/2020   VLDL 32 01/10/2020   LDLCALC 125 (H) 01/10/2020   LDLCALC 80 11/01/2019    Physical Findings: AIMS: Facial and Oral Movements Muscles of Facial Expression: None, normal Lips and Perioral Area: None, normal Jaw: None,  normal Tongue: None, normal,Extremity Movements Upper (arms, wrists, hands, fingers): None, normal Lower (legs, knees, ankles, toes): None, normal, Trunk Movements Neck, shoulders, hips: None, normal, Overall Severity Severity of abnormal movements (highest score from questions above): None, normal Incapacitation due to abnormal movements: None, normal Patient's awareness of abnormal movements (rate only patient's report): No Awareness, Dental Status Current problems with teeth and/or dentures?: No Does patient usually wear dentures?: No  CIWA:    COWS:     Musculoskeletal: Strength & Muscle Tone: within normal limits Gait & Station: normal Patient leans: N/A  Psychiatric Specialty Exam: Physical Exam Vitals and nursing note reviewed.  Constitutional:      Appearance: Normal appearance.  HENT:     Head: Normocephalic and atraumatic.     Right Ear: External ear normal.     Left Ear: External ear normal.     Nose: Nose normal.     Mouth/Throat:     Mouth: Mucous membranes are moist.     Pharynx: Oropharynx is clear.  Eyes:     Extraocular Movements: Extraocular movements intact.     Conjunctiva/sclera: Conjunctivae normal.  Cardiovascular:     Rate and Rhythm: Normal rate and regular rhythm.     Pulses: Normal pulses.  Pulmonary:     Effort: Pulmonary effort is normal.     Breath sounds: Normal breath sounds.  Abdominal:     General: Abdomen is flat.  Musculoskeletal:        General: No swelling. Normal range of motion.  Cervical back: Normal range of motion.  Skin:    General: Skin is dry.  Neurological:     General: No focal deficit present.     Mental Status: She is alert and oriented to person, place, and time.  Psychiatric:        Attention and Perception: She is inattentive.        Mood and Affect: Mood is depressed. Affect is not tearful.        Speech: Speech normal.        Behavior: Behavior is slowed.        Thought Content: Thought content does not  include suicidal ideation.        Cognition and Memory: Cognition and memory normal.        Judgment: Judgment is impulsive.     Review of Systems  Constitutional: Positive for fatigue. Negative for appetite change.  HENT: Negative for nosebleeds and sore throat.   Eyes: Negative for photophobia and visual disturbance.  Respiratory: Negative for cough and shortness of breath.   Cardiovascular: Negative for chest pain and palpitations.  Gastrointestinal: Negative for constipation, diarrhea, nausea and vomiting.  Endocrine: Negative for cold intolerance and heat intolerance.  Genitourinary: Negative for difficulty urinating and dyspareunia.  Musculoskeletal: Negative for arthralgias and gait problem.  Skin: Negative for rash and wound.  Allergic/Immunologic: Positive for environmental allergies. Negative for food allergies.  Neurological: Negative for dizziness and headaches.  Hematological: Negative for adenopathy. Does not bruise/bleed easily.  Psychiatric/Behavioral: Positive for confusion, dysphoric mood and sleep disturbance. Negative for suicidal ideas. The patient is nervous/anxious.     Blood pressure 109/61, pulse (!) 101, temperature 98.7 F (37.1 C), temperature source Oral, resp. rate 18, height 5\' 5"  (1.651 m), weight 79.8 kg, SpO2 97 %.Body mass index is 29.29 kg/m.  General Appearance: Disheveled  Eye Contact: Limited  Speech:  Clear and Coherent, short answers  Volume:  Normal  Mood:  Depressed and Dysphoric  Affect: Restricted  Thought Process:  Coherent  Orientation:  Ox2, alert  Thought Content: [place]  Suicidal Thoughts:  No  Homicidal Thoughts:  No  Memory:  Immediate;   Fair Recent;   Poor Remote;   Poor  Judgement:  Impaired  Insight:  Present  Psychomotor Activity:  Decreased  Concentration:  Concentration: Fair and Attention Span: Fair  Recall:  [employer] of Knowledge:  Poor  Language:  Fair  Akathisia:  Negative  Handed:  Right  AIMS (if  indicated):     Assets:  Desire for Improvement Financial Resources/Insurance Housing Resilience Social Support  ADL's:  Impaired  Cognition:  Impaired,  Mild  Sleep:  Number of Hours: 6.5     Treatment Plan Summary: Daily contact with patient to assess and evaluate symptoms and progress in treatment and Medication management Pt with depressive and psychotic features Delirium: Resolving- -Valium taper completed, discontinue today 01/12/20 -Klebsiella pneumoniae UTI noted on admission. Treated with one time dose of fosfomycin on 01/11/20.   Major depressive disorder, recurrent, severe with psychosis: - Continue Cymbalta 60 mg daily - Haldol 2 mg BID for psychosis  Attention issues: -ContinueVyvanse 40 mg daily  Prediabetes - Continue metformin 500 mg with breakfast  Lenward Chancellor, MD 01/14/2020, 12:08 PMPatient ID: Becky Hubbard, female   DOB: [birth year], 49 y.o.   MRN: [id number] Patient ID: URIEL HORKEY, female   DOB: 28-Dec-1970, 49 y.o.   MRN: [id number]

## 2020-01-14 NOTE — [facility] Group Notes (Signed)
[facility] Group Notes: (Clinical Social Work)   01/14/2020      Type of Therapy:  Group Therapy   Participation Level:  Did Not Attend - was invited individually by Nurse/MHT and chose not to attend.   Raina Mina, LCSWA 01/14/2020  4:12 PM

## 2020-01-14 NOTE — Progress Notes (Signed)
Patient has continued to be childlike and isolative to self. Denies SI HI and AVH

## 2020-01-14 NOTE — Plan of Care (Signed)
  Problem: Education: Goal: Knowledge of [facility] Education information/materials will improve Outcome: Progressing Goal: Emotional status will improve Outcome: Progressing Goal: Mental status will improve Outcome: Progressing Goal: Verbalization of understanding the information provided will improve Outcome: Progressing   Problem: Activity: Goal: Interest or engagement in activities will improve Outcome: Progressing Goal: Sleeping patterns will improve Outcome: Progressing   Problem: Coping: Goal: Ability to verbalize frustrations and anger appropriately will improve Outcome: Progressing Goal: Ability to demonstrate self-control will improve Outcome: Progressing   Problem: Health Behavior/Discharge Planning: Goal: Identification of resources available to assist in meeting health care needs will improve Outcome: Progressing Goal: Compliance with treatment plan for underlying cause of condition will improve Outcome: Progressing   Problem: Physical Regulation: Goal: Ability to maintain clinical measurements within normal limits will improve Outcome: Progressing   Problem: Safety: Goal: Periods of time without injury will increase Outcome: Progressing   Problem: Activity: Goal: Will verbalize the importance of balancing activity with adequate rest periods Outcome: Progressing   Problem: Education: Goal: Will be free of psychotic symptoms Outcome: Progressing Goal: Knowledge of the prescribed therapeutic regimen will improve Outcome: Progressing   Problem: Coping: Goal: Coping ability will improve Outcome: Progressing Goal: Will verbalize feelings Outcome: Progressing   Problem: Health Behavior/Discharge Planning: Goal: Compliance with prescribed medication regimen will improve Outcome: Progressing   Problem: Nutritional: Goal: Ability to achieve adequate nutritional intake will improve Outcome: Progressing   Problem: Role Relationship: Goal:  Ability to communicate needs accurately will improve Outcome: Progressing Goal: Ability to interact with others will improve Outcome: Progressing   Problem: Safety: Goal: Ability to redirect hostility and anger into socially appropriate behaviors will improve Outcome: Progressing Goal: Ability to remain free from injury will improve Outcome: Progressing   Problem: Self-Care: Goal: Ability to participate in self-care as condition permits will improve Outcome: Progressing   Problem: Self-Concept: Goal: Will verbalize positive feelings about self Outcome: Progressing   

## 2020-01-15 DIAGNOSIS — F333 Major depressive disorder, recurrent, severe with psychotic symptoms: Secondary | ICD-10-CM | POA: Diagnosis not present

## 2020-01-15 MED ORDER — HALOPERIDOL 5 MG PO TABS
5.0000 mg | ORAL_TABLET | Freq: Every day | ORAL | Status: DC
  Administered 2020-01-15 – 2020-01-17 (×3): 5 mg via ORAL
  Filled 2020-01-15 (×3): qty 1

## 2020-01-15 MED ORDER — HALOPERIDOL 1 MG PO TABS
2.0000 mg | ORAL_TABLET | Freq: Every day | ORAL | Status: DC
  Administered 2020-01-16 – 2020-01-18 (×3): 2 mg via ORAL
  Filled 2020-01-15 (×3): qty 2

## 2020-01-15 MED ORDER — DOCUSATE SODIUM 100 MG PO CAPS
100.0000 mg | ORAL_CAPSULE | Freq: Every day | ORAL | Status: DC | PRN
  Administered 2020-01-15: 100 mg via ORAL
  Filled 2020-01-15: qty 1

## 2020-01-15 NOTE — Progress Notes (Signed)
[facility] MD Progress Note  01/15/2020 1:29 PM Becky Hubbard  MRN:  [id number]   Subjective and interval history- I'm doing fine"  Becky Hubbard a 49 y.o.femalepatient admitted with psychosis.She has a history of MDD with psychosis, ADD, head injury from [place] in 2008, and PTSD. Prior to admission she stopped performing ADLs, and behaving bizarrely. No acute events overnight. She has been medication compliant during admission.   Patient seen today for follow-up , chart reviewed discussed with nursing staff , patient reportedly confused and wanders at times but mostly isolating in her room . Today, patient is oriented to self, and place. She continues to be child-like and guarded. She denies SI/HI/AH/VH, and has been medication compliant. However, she has yet to shower or change her clothing during admission. As soon as this topic is brought up she states, "I'm done talking now." Reportedly calling mother and telling her we have made her keep her jacket on.   Contacted her mother, Lerry Liner, [phone]. Updated her on plan of care, and current medication regimen. Deloris continues to be concerned about her lack of self-care. Typically, Celese is very concerned about her appearance, and never leaves the house without showering and doing her make-up. She notes her decline in self-care did not happen until about a month ago. Deloris states that Treniece has called several times with paranoia about her cloths, and people trying to cause an allergic reaction. She confirms no known allergy to Tide detergent.     Principal Problem: Major depressive disorder, recurrent episode, severe, with psychosis ([place]) Diagnosis: Principal Problem:   Major depressive disorder, recurrent episode, severe, with psychosis ([place]) Active Problems:   Fibromyalgia syndrome   Auditory hallucinations   PTSD (post-traumatic stress disorder)   ADD (attention deficit disorder)   Borderline diabetes  Total Time spent with patient:  30 minutes  Past Psychiatric History: Previously [facility] by [facility] by Dr. Kasandra Knudsen, prescribed Cymbalta 30 mg daily, Valium 10 mg TID, Vyvanse 40 mg daily. No prior inpatient admissions. No previous suicide attempts.   Past Medical History:  Past Medical History:  Diagnosis Date  . ADD (attention deficit disorder)   . Anxiety   . Arthritis   . Auditory hallucinations   . Carpal tunnel syndrome on both sides   . Cervical spine fracture ([place]) 2008   Closed with screw C2-3 [facility] Dr. Delilah Shan  . Degenerative joint disease of spine   . Depression   . Diabetes mellitus without complication ([place])   . Family history of adverse reaction to anesthesia    mom- slow to awaken  . Fibromyalgia   . Foot fracture, left   . Gastroparesis   . Gastroparesis   . GERD (gastroesophageal reflux disease)   . Hearing loss   . Herniated cervical disc   . History of prescription drug abuse   . Neuromuscular disorder (HCC)    fibromyalgia/chronic fatique syndrome  . Neuropathy of foot   . OCD (obsessive compulsive disorder)   . 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 Plaque psoriasis   . PTSD (post-traumatic stress disorder)   . Stroke ([place])   . Ulcer     Past Surgical History:  Procedure Laterality Date  . CARPAL TUNNEL RELEASE Left 07/12/2015   Procedure: OPEN CARPAL TUNNEL RELEASE OF LEFT HAND;  Surgeon: Leanor Kail, MD;  Location: [facility];  Service: Orthopedics;  Laterality: Left;  . cervical radiofrequency neurotomy    . FRACTURE SURGERY    . MOUTH SURGERY     upper and lower dentures  . NASAL SEPTUM SURGERY    . occipital nerve blocks    . SPINE SURGERY     cervical   Family History:  Family History  Problem Relation Age of Onset  . Cancer Mother 54       breast cancer  . Migraines Mother   . Hypertension Mother   . Arthritis Mother   . Sleep  apnea Mother   . Anxiety disorder Mother   . Depression Mother   . Diabetes Father   . Cancer Father 94       of spine , pancreas and liver  . Anxiety disorder Father   . Depression Father   . Diabetes Brother   . Hyperlipidemia Brother   . Sleep apnea Brother   . Cancer Maternal Grandfather        leukemia, prostate  . Cancer Paternal Grandfather        bone  . Cancer Maternal Aunt        breast  . Mental illness Maternal Grandmother        Dementia  . Stroke Paternal Grandmother   . Diabetes Brother   . Cancer Paternal Aunt        breast   Family Psychiatric  History: Mother with depression and anxiety Social History:  Social History   Substance and Sexual Activity  Alcohol Use No     Social History   Substance and Sexual Activity  Drug Use No    Social History   Socioeconomic History  . Marital status: Divorced    Spouse name: Not on file  . Number of children: Not on file  . Years of education: Not on file  . Highest education level: Not on file  Occupational History  . Not on file  Tobacco Use  . Smoking status: Current Every Day Smoker    Packs/day: 1.00    Years: 20.00    Pack years: 20.00    Types: Cigarettes    Start date: 03/01/2011  . Smokeless tobacco: Never Used  Vaping Use  . Vaping Use: Never used  Substance and Sexual Activity  . Alcohol use: No  . Drug use: No  . Sexual activity: Yes    Birth control/protection: None  Other Topics Concern  . Not on file  Social History Narrative   Depression- history of suicide attempt and hospitalization about 1998 after divorce   H/O hospitalization at [facility] for narcotics detox/withdrawal approx. 2000   Social Determinants of Health   Financial Resource Strain:   . Difficulty of Paying Living Expenses: Not on file  Food Insecurity:   . Worried About Charity fundraiser in the Last Year: Not on file  . Ran Out of Food in the Last Year: Not on file  Transportation Needs:   .  Lack of Transportation (Medical): Not on file  . Lack of Transportation (Non-Medical): Not on file  Physical Activity:   . Days of Exercise per Week: Not on file  . Minutes of Exercise per Session: Not on file  Stress:   . Feeling of Stress : Not on file  Social Connections:   . Frequency of Communication with Friends and Family: Not on file  . Frequency of Social Gatherings with Friends and Family: Not on file  . Attends  Religious Services: Not on file  . Active Member of Clubs or Organizations: Not on file  . Attends Archivist Meetings: Not on file  . Marital Status: Not on file   Additional Social History:                         Sleep: Fair  Appetite:  Fair  Current Medications: Current Facility-Administered Medications  Medication Dose Route Frequency Provider Last Rate Last Admin  . acetaminophen (TYLENOL) tablet 650 mg  650 mg Oral Q6H PRN Patrecia Pour, NP   650 mg at 01/11/20 0757  . alum & mag hydroxide-simeth (MAALOX/MYLANTA) 200-200-20 MG/5ML suspension 30 mL  30 mL Oral Q4H PRN Patrecia Pour, NP      . aspirin EC tablet 81 mg  81 mg Oral Daily Patrecia Pour, NP   81 mg at 01/15/20 0749  . docusate sodium (COLACE) capsule 100 mg  100 mg Oral Daily PRN Salley Scarlet, MD   100 mg at 01/15/20 1248  . DULoxetine (CYMBALTA) DR capsule 60 mg  60 mg Oral Daily Salley Scarlet, MD   60 mg at 01/15/20 0749  . feeding supplement (ENSURE ENLIVE / ENSURE PLUS) liquid 237 mL  237 mL Oral BID BM Salley Scarlet, MD   237 mL at 01/15/20 1024  . [START ON 01/16/2020] haloperidol (HALDOL) tablet 2 mg  2 mg Oral Daily Selina Cooley M, MD      . haloperidol (HALDOL) tablet 5 mg  5 mg Oral QAC supper Salley Scarlet, MD      . hydrOXYzine (ATARAX/VISTARIL) tablet 25 mg  25 mg Oral TID PRN Salley Scarlet, MD      . lisdexamfetamine (VYVANSE) capsule 40 mg  40 mg Oral Daily Patrecia Pour, NP   40 mg at 01/15/20 0748  . metFORMIN (GLUCOPHAGE) tablet 500 mg   500 mg Oral Q breakfast Patrecia Pour, NP   500 mg at 01/15/20 6644    Lab Results:  No results found for this or any previous visit (from the past 48 hour(s)).  Blood Alcohol level:  Lab Results  Component Value Date   ETH <10 03/47/4259    Metabolic Disorder Labs: Lab Results  Component Value Date   HGBA1C 5.7 (H) 01/10/2020   MPG 116.89 01/10/2020   MPG 117 02/12/2016   No results found for: PROLACTIN Lab Results  Component Value Date   CHOL 188 01/10/2020   TRIG 162 (H) 01/10/2020   HDL 31 (L) 01/10/2020   CHOLHDL 6.1 01/10/2020   VLDL 32 01/10/2020   LDLCALC 125 (H) 01/10/2020   LDLCALC 80 11/01/2019    Physical Findings: AIMS: Facial and Oral Movements Muscles of Facial Expression: None, normal Lips and Perioral Area: None, normal Jaw: None, normal Tongue: None, normal,Extremity Movements Upper (arms, wrists, hands, fingers): None, normal Lower (legs, knees, ankles, toes): None, normal, Trunk Movements Neck, shoulders, hips: None, normal, Overall Severity Severity of abnormal movements (highest score from questions above): None, normal Incapacitation due to abnormal movements: None, normal Patient's awareness of abnormal movements (rate only patient's report): No Awareness, Dental Status Current problems with teeth and/or dentures?: No Does patient usually wear dentures?: No  CIWA:    COWS:     Musculoskeletal: Strength & Muscle Tone: within normal limits Gait & Station: normal Patient leans: N/A  Psychiatric Specialty Exam: Physical Exam Vitals and nursing note reviewed.  Constitutional:  Appearance: Normal appearance.  HENT:     Head: Normocephalic and atraumatic.     Right Ear: External ear normal.     Left Ear: External ear normal.     Nose: Nose normal.     Mouth/Throat:     Mouth: Mucous membranes are moist.     Pharynx: Oropharynx is clear.  Eyes:     Extraocular Movements: Extraocular movements intact.     Conjunctiva/sclera:  Conjunctivae normal.  Cardiovascular:     Rate and Rhythm: Normal rate and regular rhythm.     Pulses: Normal pulses.  Pulmonary:     Effort: Pulmonary effort is normal.     Breath sounds: Normal breath sounds.  Abdominal:     General: Abdomen is flat.  Musculoskeletal:        General: No swelling. Normal range of motion.     Cervical back: Normal range of motion.  Skin:    General: Skin is dry.  Neurological:     General: No focal deficit present.     Mental Status: She is alert and oriented to person, place, and time.  Psychiatric:        Attention and Perception: She is inattentive.        Mood and Affect: Mood is depressed. Affect is not tearful.        Speech: Speech normal.        Behavior: Behavior is slowed.        Thought Content: Thought content does not include suicidal ideation.        Cognition and Memory: Cognition and memory normal.        Judgment: Judgment is impulsive.     Review of Systems  Constitutional: Positive for fatigue. Negative for appetite change.  HENT: Negative for nosebleeds and sore throat.   Eyes: Negative for photophobia and visual disturbance.  Respiratory: Negative for cough and shortness of breath.   Cardiovascular: Negative for chest pain and palpitations.  Gastrointestinal: Negative for constipation, diarrhea, nausea and vomiting.  Endocrine: Negative for cold intolerance and heat intolerance.  Genitourinary: Negative for difficulty urinating and dyspareunia.  Musculoskeletal: Negative for arthralgias and gait problem.  Skin: Negative for rash and wound.  Allergic/Immunologic: Positive for environmental allergies. Negative for food allergies.  Neurological: Negative for dizziness and headaches.  Hematological: Negative for adenopathy. Does not bruise/bleed easily.  Psychiatric/Behavioral: Positive for confusion, dysphoric mood and sleep disturbance. Negative for suicidal ideas. The patient is nervous/anxious.     Blood pressure  105/73, pulse 93, temperature 98.2 F (36.8 C), temperature source Oral, resp. rate 17, height 5\' 5"  (1.651 m), weight 79.8 kg, SpO2 97 %.Body mass index is 29.29 kg/m.  General Appearance: Disheveled  Eye Contact: Limited  Speech:  Clear and Coherent, short answers  Volume:  Normal  Mood:  Depressed and Dysphoric  Affect: Restricted  Thought Process:  Coherent  Orientation:  Ox2, alert  Thought Content: Concrete  Suicidal Thoughts:  No  Homicidal Thoughts:  No  Memory:  Immediate;   Fair Recent;   Poor Remote;   Poor  Judgement:  Impaired  Insight:  Present  Psychomotor Activity:  Decreased  Concentration:  Concentration: Fair and Attention Span: Fair  Recall:  [employer] of Knowledge:  Poor  Language:  Fair  Akathisia:  Negative  Handed:  Right  AIMS (if indicated):     Assets:  Desire for Improvement Financial Resources/Insurance Housing Resilience Social Support  ADL's:  Impaired  Cognition:  Impaired,  Mild  Sleep:  Number of Hours: 6.45     Treatment Plan Summary: Daily contact with patient to assess and evaluate symptoms and progress in treatment and Medication management Pt with depressive and psychotic features Delirium: Resolving- -Valium taper completed, discontinue today 01/12/20 -Klebsiella pneumoniae UTI noted on admission. Treated with one time dose of fosfomycin on 01/11/20.   Major depressive disorder, recurrent, severe with psychosis: - Continue Cymbalta 60 mg daily - Increase Haldol 2 mg in the morning, 5 mg in the evening  Attention issues: -ContinueVyvanse 40 mg daily  Prediabetes - Continue metformin 500 mg with breakfast  Salley Scarlet, MD 01/15/2020, 1:29 PMPatient ID: Becky Hubbard, female   DOB: 1971-01-13, 49 y.o.   MRN: [id number] Patient ID: Becky Hubbard, female   DOB: [birth year], 50 y.o.   MRN: [id number]

## 2020-01-15 NOTE — BHH Counselor (Signed)
CSW spoke with pt at length about ACTT teams, group homes and Assisted Living Facilities at the request of her mother.  Patient declined all services, stating that she wished to continue with current provider and it was "God's plan".   CSW encouraged pt to consider her options and discuss with mother as mother wants patient in more structured environment.   CSW discussed with the patient [employer] and shelter options.  Assunta Curtis, MSW, LCSW 01/15/2020 9:40 AM

## 2020-01-15 NOTE — Progress Notes (Signed)
Recreation Therapy Notes   Date: 01/15/2020  Time: 9:30 am   Location: Craft room     Behavioral response: N/A   Intervention Topic: Relaxation   Discussion/Intervention: Patient did not attend group.   Clinical Observations/Feedback:  Patient did not attend group.   Shandee Jergens LRT/CTRS        Becky Hubbard 01/15/2020 2:04 PM

## 2020-01-15 NOTE — Progress Notes (Signed)
Patient calm and cooperative during assessment. Patient endorses anxiety and depression, denies SI/HI/AVH. Patient observed interacting appropriately with staff and peers on the unit. Patient didn't have any medications scheduled this evening and didn't request anything PRN. Patient given education, support, and encouragement to be active in her treatment plan. Patient being monitored Q 15 minutes for safety per unit protocol. Pt remains safe on the unit

## 2020-01-15 NOTE — Plan of Care (Signed)
Patient presents at her baseline   Problem: Education: Goal: Emotional status will improve Outcome: Not Progressing Goal: Mental status will improve Outcome: Not Progressing

## 2020-01-15 NOTE — Plan of Care (Signed)
  Problem: Education: Goal: Knowledge of [facility] Education information/materials will improve Outcome: Not Progressing Goal: Emotional status will improve Outcome: Not Progressing Goal: Mental status will improve Outcome: Not Progressing Goal: Verbalization of understanding the information provided will improve Outcome: Not Progressing   Problem: Activity: Goal: Interest or engagement in activities will improve Outcome: Not Progressing Goal: Sleeping patterns will improve Outcome: Not Progressing   Problem: Coping: Goal: Ability to verbalize frustrations and anger appropriately will improve Outcome: Not Progressing Goal: Ability to demonstrate self-control will improve Outcome: Not Progressing   Problem: Health Behavior/Discharge Planning: Goal: Identification of resources available to assist in meeting health care needs will improve Outcome: Not Progressing Goal: Compliance with treatment plan for underlying cause of condition will improve Outcome: Not Progressing   Problem: Physical Regulation: Goal: Ability to maintain clinical measurements within normal limits will improve Outcome: Not Progressing   Problem: Safety: Goal: Periods of time without injury will increase Outcome: Not Progressing   Problem: Activity: Goal: Will verbalize the importance of balancing activity with adequate rest periods Outcome: Not Progressing   Problem: Education: Goal: Will be free of psychotic symptoms Outcome: Not Progressing Goal: Knowledge of the prescribed therapeutic regimen will improve Outcome: Not Progressing   Problem: Coping: Goal: Coping ability will improve Outcome: Not Progressing Goal: Will verbalize feelings Outcome: Not Progressing   Problem: Health Behavior/Discharge Planning: Goal: Compliance with prescribed medication regimen will improve Outcome: Not Progressing   Problem: Nutritional: Goal: Ability to achieve adequate nutritional intake will  improve Outcome: Not Progressing   Problem: Role Relationship: Goal: Ability to communicate needs accurately will improve Outcome: Not Progressing Goal: Ability to interact with others will improve Outcome: Not Progressing   Problem: Safety: Goal: Ability to redirect hostility and anger into socially appropriate behaviors will improve Outcome: Not Progressing Goal: Ability to remain free from injury will improve Outcome: Not Progressing   Problem: Self-Care: Goal: Ability to participate in self-care as condition permits will improve Outcome: Not Progressing   Problem: Self-Concept: Goal: Will verbalize positive feelings about self Outcome: Not Progressing

## 2020-01-15 NOTE — Progress Notes (Signed)
Remains child-like, cautious and timid. Denies SI, HI and AVH

## 2020-01-15 NOTE — B[facility] Group Notes (Signed)
LCSW Group Therapy Note   01/15/2020 2:25 PM  Type of Therapy and Topic:  Group Therapy:  Overcoming Obstacles   Participation Level:  Did Not Attend   Description of Group:    In this group patients will be encouraged to explore what they see as obstacles to their own wellness and recovery. They will be guided to discuss their thoughts, feelings, and behaviors related to these obstacles. The group will process together ways to cope with barriers, with attention given to specific choices patients can make. Each patient will be challenged to identify changes they are motivated to make in order to overcome their obstacles. This group will be process-oriented, with patients participating in exploration of their own experiences as well as giving and receiving support and challenge from other group members.   Therapeutic Goals: 1. Patient will identify personal and current obstacles as they relate to admission. 2. Patient will identify barriers that currently interfere with their wellness or overcoming obstacles.  3. Patient will identify feelings, thought process and behaviors related to these barriers. 4. Patient will identify two changes they are willing to make to overcome these obstacles:      Summary of Patient Progress X    Therapeutic Modalities:   Cognitive Behavioral Therapy Solution Focused Therapy Motivational Interviewing Relapse Prevention Therapy  Hedy Camara R. Guerry Bruin, MSW, LCSW, [place] 01/15/2020 2:25 PM

## 2020-01-15 NOTE — Plan of Care (Signed)
Patient is in & out of her room.Calm,cooperative and smiles on approach.Encouraged patient for shower.Patient states " I will take when I get home." Patient pleasantly refused and states " please don't tell me that again." Compliant with medications.Denies SI,HI and AVH.Appetite and energy level good.Attended groups.Support and encouragement given.

## 2020-01-15 NOTE — B[facility] Counselor (Signed)
CSW contacted the patient's mother, [name], [phone].  CSW updated mother that patient again declined ACTT team referrals.  CSW informed mother that patient is declining group home and ALF referrals at this time.   Mother reports that she may pursue guardianship of pt.  Mother reports that pt has court for a civil matter on 01/16/2020.  She reports that the following number is who CSW needs to contact to determine if court letter is necessary, [phone].  Assunta Curtis, MSW, LCSW 01/15/2020 1:28 PM

## 2020-01-16 DIAGNOSIS — F333 Major depressive disorder, recurrent, severe with psychotic symptoms: Secondary | ICD-10-CM | POA: Diagnosis not present

## 2020-01-16 NOTE — Progress Notes (Signed)
Pt has been pleasant, calm and cooperative with a worried affect. Pt was encouraged to attend groups and take a shower throughout the day but did not. Pt has received PRNs for pain.  Collier Bullock RN

## 2020-01-16 NOTE — Progress Notes (Signed)
[facility] MD Progress Note  01/16/2020 11:52 AM Becky Hubbard  MRN:  [id number]   Subjective and interval history- I'm doing fine"  Becky Hubbard a 49 y.o.femalepatient admitted with psychosis.She has a history of MDD with psychosis, ADD, head injury from [place] in 2008, and PTSD. Prior to admission she stopped performing ADLs, and behaving bizarrely. No acute events overnight. She has been medication compliant during admission.   Patient seen today for follow-up , chart reviewed discussed with nursing staff , patient reportedly confused and wanders at times but mostly isolating in her room . Today, patient is oriented to self, and place. She continues to be child-like and guarded. She denies SI/HI/AH/VH, and has been medication compliant. However, she has yet to shower or change her clothing during admission. She continues to be guarded and unable to provide rationale on why she has refused to shower or change cloths. Her mother brought her own cloths to the unit yesterday to see if this would assist.     Principal Problem: Major depressive disorder, recurrent episode, severe, with psychosis ([place]) Diagnosis: Principal Problem:   Major depressive disorder, recurrent episode, severe, with psychosis ([place]) Active Problems:   Fibromyalgia syndrome   Auditory hallucinations   PTSD (post-traumatic stress disorder)   ADD (attention deficit disorder)   Borderline diabetes  Total Time spent with patient: 30 minutes  Past Psychiatric History: Previously seen by [facility] unit by Dr. Kasandra Knudsen, prescribed Cymbalta 30 mg daily, Valium 10 mg TID, Vyvanse 40 mg daily. No prior inpatient admissions. No previous suicide attempts.   Past Medical History:  Past Medical History:  Diagnosis Date  . ADD (attention deficit disorder)   . Anxiety   . Arthritis   . Auditory hallucinations   . Carpal tunnel syndrome on both sides   . Cervical spine fracture ([place]) 2008   Closed with screw C2-3 [address].  Delilah Shan  . Degenerative joint disease of spine   . Depression   . Diabetes mellitus without complication ([place])   . Family history of adverse reaction to anesthesia    mom- slow to awaken  . Fibromyalgia   . Foot fracture, left   . Gastroparesis   . Gastroparesis   . GERD (gastroesophageal reflux disease)   . Hearing loss   . Herniated cervical disc   . History of prescription drug abuse   . Neuromuscular disorder (HCC)    fibromyalgia/chronic fatique syndrome  . Neuropathy of foot   . OCD (obsessive compulsive disorder)   . 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 Plaque psoriasis   . PTSD (post-traumatic stress disorder)   . Stroke ([place])   . Ulcer     Past Surgical History:  Procedure Laterality Date  . CARPAL TUNNEL RELEASE Left 07/12/2015   Procedure: OPEN CARPAL TUNNEL RELEASE OF LEFT HAND;  Surgeon: Leanor Kail, MD;  Location: [facility];  Service: Orthopedics;  Laterality: Left;  . cervical radiofrequency neurotomy    . FRACTURE SURGERY    . MOUTH SURGERY     upper and lower dentures  . NASAL SEPTUM SURGERY    . occipital nerve blocks    . SPINE SURGERY     cervical   Family History:  Family History  Problem Relation Age of Onset  . Cancer Mother 48       breast cancer  .  Migraines Mother   . Hypertension Mother   . Arthritis Mother   . Sleep apnea Mother   . Anxiety disorder Mother   . Depression Mother   . Diabetes Father   . Cancer Father 48       of spine , pancreas and liver  . Anxiety disorder Father   . Depression Father   . Diabetes Brother   . Hyperlipidemia Brother   . Sleep apnea Brother   . Cancer Maternal Grandfather        leukemia, prostate  . Cancer Paternal Grandfather        bone  . Cancer Maternal Aunt        breast  . Mental illness Maternal Grandmother        Dementia  . Stroke Paternal Grandmother   .  Diabetes Brother   . Cancer Paternal Aunt        breast   Family Psychiatric  History: Mother with depression and anxiety Social History:  Social History   Substance and Sexual Activity  Alcohol Use No     Social History   Substance and Sexual Activity  Drug Use No    Social History   Socioeconomic History  . Marital status: Divorced    Spouse name: Not on file  . Number of children: Not on file  . Years of education: Not on file  . Highest education level: Not on file  Occupational History  . Not on file  Tobacco Use  . Smoking status: Current Every Day Smoker    Packs/day: 1.00    Years: 20.00    Pack years: 20.00    Types: Cigarettes    Start date: 03/01/2011  . Smokeless tobacco: Never Used  Vaping Use  . Vaping Use: Never used  Substance and Sexual Activity  . Alcohol use: No  . Drug use: No  . Sexual activity: Yes    Birth control/protection: None  Other Topics Concern  . Not on file  Social History Narrative   Depression- history of suicide attempt and hospitalization about 1998 after divorce   H/O hospitalization at [facility] for narcotics detox/withdrawal approx. 2000   Social Determinants of Health   Financial Resource Strain:   . Difficulty of Paying Living Expenses: Not on file  Food Insecurity:   . Worried About Charity fundraiser in the Last Year: Not on file  . Ran Out of Food in the Last Year: Not on file  Transportation Needs:   . Lack of Transportation (Medical): Not on file  . Lack of Transportation (Non-Medical): Not on file  Physical Activity:   . Days of Exercise per Week: Not on file  . Minutes of Exercise per Session: Not on file  Stress:   . Feeling of Stress : Not on file  Social Connections:   . Frequency of Communication with Friends and Family: Not on file  . Frequency of Social Gatherings with Friends and Family: Not on file  . Attends Religious Services: Not on file  . Active Member of Clubs or Organizations:  Not on file  . Attends Archivist Meetings: Not on file  . Marital Status: Not on file   Additional Social History:                         Sleep: Fair  Appetite:  Fair  Current Medications: Current Facility-Administered Medications  Medication Dose Route Frequency Provider Last Rate Last  Admin  . acetaminophen (TYLENOL) tablet 650 mg  650 mg Oral Q6H PRN Patrecia Pour, NP   650 mg at 01/16/20 1131  . alum & mag hydroxide-simeth (MAALOX/MYLANTA) 200-200-20 MG/5ML suspension 30 mL  30 mL Oral Q4H PRN Patrecia Pour, NP      . aspirin EC tablet 81 mg  81 mg Oral Daily Patrecia Pour, NP   81 mg at 01/16/20 0749  . docusate sodium (COLACE) capsule 100 mg  100 mg Oral Daily PRN Salley Scarlet, MD   100 mg at 01/15/20 1248  . DULoxetine (CYMBALTA) DR capsule 60 mg  60 mg Oral Daily Salley Scarlet, MD   60 mg at 01/16/20 0749  . feeding supplement (ENSURE ENLIVE / ENSURE PLUS) liquid 237 mL  237 mL Oral BID BM Salley Scarlet, MD   237 mL at 01/16/20 1000  . haloperidol (HALDOL) tablet 2 mg  2 mg Oral Daily Salley Scarlet, MD   2 mg at 01/16/20 0750  . haloperidol (HALDOL) tablet 5 mg  5 mg Oral QAC supper Salley Scarlet, MD   5 mg at 01/15/20 1637  . hydrOXYzine (ATARAX/VISTARIL) tablet 25 mg  25 mg Oral TID PRN Salley Scarlet, MD      . lisdexamfetamine (VYVANSE) capsule 40 mg  40 mg Oral Daily Patrecia Pour, NP   40 mg at 01/16/20 0750  . metFORMIN (GLUCOPHAGE) tablet 500 mg  500 mg Oral Q breakfast Patrecia Pour, NP   500 mg at 01/16/20 4270    Lab Results:  No results found for this or any previous visit (from the past 48 hour(s)).  Blood Alcohol level:  Lab Results  Component Value Date   ETH <10 62/37/6283    Metabolic Disorder Labs: Lab Results  Component Value Date   HGBA1C 5.7 (H) 01/10/2020   MPG 116.89 01/10/2020   MPG 117 02/12/2016   No results found for: PROLACTIN Lab Results  Component Value Date   CHOL 188 01/10/2020    TRIG 162 (H) 01/10/2020   HDL 31 (L) 01/10/2020   CHOLHDL 6.1 01/10/2020   VLDL 32 01/10/2020   LDLCALC 125 (H) 01/10/2020   LDLCALC 80 11/01/2019    Physical Findings: AIMS: Facial and Oral Movements Muscles of Facial Expression: None, normal Lips and Perioral Area: None, normal Jaw: None, normal Tongue: None, normal,Extremity Movements Upper (arms, wrists, hands, fingers): None, normal Lower (legs, knees, ankles, toes): None, normal, Trunk Movements Neck, shoulders, hips: None, normal, Overall Severity Severity of abnormal movements (highest score from questions above): None, normal Incapacitation due to abnormal movements: None, normal Patient's awareness of abnormal movements (rate only patient's report): No Awareness, Dental Status Current problems with teeth and/or dentures?: No Does patient usually wear dentures?: No  CIWA:    COWS:     Musculoskeletal: Strength & Muscle Tone: within normal limits Gait & Station: normal Patient leans: N/A  Psychiatric Specialty Exam: Physical Exam Vitals and nursing note reviewed.  Constitutional:      Appearance: Normal appearance.  HENT:     Head: Normocephalic and atraumatic.     Right Ear: External ear normal.     Left Ear: External ear normal.     Nose: Nose normal.     Mouth/Throat:     Mouth: Mucous membranes are moist.     Pharynx: Oropharynx is clear.  Eyes:     Extraocular Movements: Extraocular movements intact.     Conjunctiva/sclera: Conjunctivae normal.  Cardiovascular:     Rate and Rhythm: Normal rate and regular rhythm.     Pulses: Normal pulses.  Pulmonary:     Effort: Pulmonary effort is normal.     Breath sounds: Normal breath sounds.  Abdominal:     General: Abdomen is flat.  Musculoskeletal:        General: No swelling. Normal range of motion.     Cervical back: Normal range of motion.  Skin:    General: Skin is dry.  Neurological:     General: No focal deficit present.     Mental Status: She is  alert and oriented to person, place, and time.  Psychiatric:        Attention and Perception: She is inattentive.        Mood and Affect: Mood is depressed. Affect is not tearful.        Speech: Speech normal.        Behavior: Behavior is slowed.        Thought Content: Thought content does not include suicidal ideation.        Cognition and Memory: Cognition and memory normal.        Judgment: Judgment is impulsive.     Review of Systems  Constitutional: Positive for fatigue. Negative for appetite change.  HENT: Negative for nosebleeds and sore throat.   Eyes: Negative for photophobia and visual disturbance.  Respiratory: Negative for cough and shortness of breath.   Cardiovascular: Negative for chest pain and palpitations.  Gastrointestinal: Negative for constipation, diarrhea, nausea and vomiting.  Endocrine: Negative for cold intolerance and heat intolerance.  Genitourinary: Negative for difficulty urinating and dyspareunia.  Musculoskeletal: Negative for arthralgias and gait problem.  Skin: Negative for rash and wound.  Allergic/Immunologic: Positive for environmental allergies. Negative for food allergies.  Neurological: Negative for dizziness and headaches.  Hematological: Negative for adenopathy. Does not bruise/bleed easily.  Psychiatric/Behavioral: Positive for confusion, dysphoric mood and sleep disturbance. Negative for suicidal ideas. The patient is nervous/anxious.     Blood pressure (!) 118/55, pulse 95, temperature 98 F (36.7 C), temperature source Oral, resp. rate 17, height 5\' 5"  (1.651 m), weight 79.8 kg, SpO2 98 %.Body mass index is 29.29 kg/m.  General Appearance: Disheveled  Eye Contact: Limited  Speech:  Clear and Coherent, short answers  Volume:  Normal  Mood:  Depressed and Dysphoric  Affect: Restricted  Thought Process:  Coherent  Orientation:  Ox2, alert  Thought Content: [place]  Suicidal Thoughts:  No  Homicidal Thoughts:  No  Memory:   Immediate;   Fair Recent;   Poor Remote;   Poor  Judgement:  Impaired  Insight:  Present  Psychomotor Activity:  Decreased  Concentration:  Concentration: Fair and Attention Span: Fair  Recall:  [employer] of Knowledge:  Poor  Language:  Fair  Akathisia:  Negative  Handed:  Right  AIMS (if indicated):     Assets:  Desire for Improvement Financial Resources/Insurance Housing Resilience Social Support  ADL's:  Impaired  Cognition:  Impaired,  Mild  Sleep:  Number of Hours: 8     Treatment Plan Summary: Daily contact with patient to assess and evaluate symptoms and progress in treatment and Medication management Pt with depressive and psychotic features Delirium: Resolving- -Valium taper completed, discontinue today 01/12/20 -Klebsiella pneumoniae UTI noted on admission. Treated with one time dose of fosfomycin on 01/11/20.   Major depressive disorder, recurrent, severe with psychosis: - Continue Cymbalta 60 mg daily - Continue Haldol 2  mg in the morning, 5 mg in the evening  Attention issues: -ContinueVyvanse 40 mg daily  Prediabetes - Continue metformin 500 mg with breakfast  Salley Scarlet, MD 01/16/2020, 11:52 AMPatient ID: Becky Hubbard, female   DOB: 07-10-1970, 49 y.o.   MRN: [id number] Patient ID: Becky Hubbard, female   DOB: [birth year], 49 y.o.   MRN: [id number]

## 2020-01-16 NOTE — Progress Notes (Signed)
Recreation Therapy Notes  Date: 01/16/2020  Time: 9:30 am   Location: Craft room     Behavioral response: N/A   Intervention Topic: Problem Solving    Discussion/Intervention: Patient did not attend group.   Clinical Observations/Feedback:  Patient did not attend group.   Lise Pincus LRT/CTRS        Becky Hubbard 01/16/2020 10:55 AM

## 2020-01-16 NOTE — Plan of Care (Signed)
Patient presenting at her baseline   Problem: Education: Goal: Emotional status will improve Outcome: Not Progressing Goal: Mental status will improve Outcome: Not Progressing

## 2020-01-16 NOTE — Plan of Care (Signed)
Pt rates anxiety 4/10. Pt denies depression, anxiety, SI, HI and AVH. Pt was educated on care plan and verbalizes understanding. Pt encouraged to attend groups and take a shower. Collier Bullock RN Problem: Education: Goal: Knowledge of [place] [facility] Education information/materials will improve Outcome: Progressing Goal: Emotional status will improve Outcome: Progressing Goal: Mental status will improve Outcome: Progressing Goal: Verbalization of understanding the information provided will improve Outcome: Progressing   Problem: Activity: Goal: Interest or engagement in activities will improve Outcome: Progressing Goal: Sleeping patterns will improve Outcome: Progressing   Problem: Coping: Goal: Ability to verbalize frustrations and anger appropriately will improve Outcome: Progressing Goal: Ability to demonstrate self-control will improve Outcome: Progressing   Problem: Health Behavior/Discharge Planning: Goal: Identification of resources available to assist in meeting health care needs will improve Outcome: Progressing Goal: Compliance with treatment plan for underlying cause of condition will improve Outcome: Progressing   Problem: Physical Regulation: Goal: Ability to maintain clinical measurements within normal limits will improve Outcome: Progressing   Problem: Safety: Goal: Periods of time without injury will increase Outcome: Progressing   Problem: Activity: Goal: Will verbalize the importance of balancing activity with adequate rest periods Outcome: Progressing   Problem: Education: Goal: Will be free of psychotic symptoms Outcome: Progressing Goal: Knowledge of the prescribed therapeutic regimen will improve Outcome: Progressing   Problem: Coping: Goal: Coping ability will improve Outcome: Progressing Goal: Will verbalize feelings Outcome: Progressing   Problem: Health Behavior/Discharge Planning: Goal: Compliance with prescribed medication regimen  will improve Outcome: Progressing   Problem: Nutritional: Goal: Ability to achieve adequate nutritional intake will improve Outcome: Progressing   Problem: Role Relationship: Goal: Ability to communicate needs accurately will improve Outcome: Progressing Goal: Ability to interact with others will improve Outcome: Progressing   Problem: Safety: Goal: Ability to redirect hostility and anger into socially appropriate behaviors will improve Outcome: Progressing Goal: Ability to remain free from injury will improve Outcome: Progressing   Problem: Self-Care: Goal: Ability to participate in self-care as condition permits will improve Outcome: Progressing   Problem: Self-Concept: Goal: Will verbalize positive feelings about self Outcome: Progressing

## 2020-01-16 NOTE — Progress Notes (Signed)
Patient presents with anxious affect and was observed having crying spells by this Probation officer. When patient was assessed she stated, "I don't have my nerve pills, that's why I am upset." Patient given PRN medication, see MAR. Patient endorses anxiety and depression, denies SI/HI/AVH. Patient given education, support, and encouragement to be active in her treatment plan. Patient being monitored Q 15 minutes for safety per unit protocol. Pt remains safe on the unit

## 2020-01-17 DIAGNOSIS — F333 Major depressive disorder, recurrent, severe with psychotic symptoms: Secondary | ICD-10-CM | POA: Diagnosis not present

## 2020-01-17 NOTE — Progress Notes (Signed)
[facility] MD Progress Note  01/17/2020 1:08 PM Becky Hubbard  MRN:  [id number]   Subjective and interval history- I'm doing fine"  CODI FOLKERTS a 49 y.o.femalepatient admitted with psychosis.She has a history of MDD with psychosis, ADD, head injury from [place] in 2008, and PTSD. Prior to admission she stopped performing ADLs, and behaving bizarrely. No acute events overnight. She has been medication compliant during admission.   Patient seen today for follow-up , chart reviewed discussed with nursing staff. Yesterday patient remained isolated to her room. She continues to refuse to shower or change her cloths despite multiple prompts from myself and nursing staff.  Today, patient is oriented to self, and place. She continues to be child-like and guarded. She denies SI/HI/AH/VH, and has been medication compliant. However, she continues to be guarded and unable to provide rationale on why she has refused to shower or change cloths.     Principal Problem: Major depressive disorder, recurrent episode, severe, with psychosis ([place]) Diagnosis: Principal Problem:   Major depressive disorder, recurrent episode, severe, with psychosis ([place]) Active Problems:   Fibromyalgia syndrome   Auditory hallucinations   PTSD (post-traumatic stress disorder)   ADD (attention deficit disorder)   Borderline diabetes  Total Time spent with patient: 30 minutes  Past Psychiatric History: Previously seen by [facility] unit by Dr. Kasandra Knudsen, prescribed Cymbalta 30 mg daily, Valium 10 mg TID, Vyvanse 40 mg daily. No prior inpatient admissions. No previous suicide attempts.   Past Medical History:  Past Medical History:  Diagnosis Date  . ADD (attention deficit disorder)   . Anxiety   . Arthritis   . Auditory hallucinations   . Carpal tunnel syndrome on both sides   . Cervical spine fracture ([place]) 2008   Closed with screw C2-3 [facility] Dr. Delilah Shan  . Degenerative joint disease of spine   . Depression   . Diabetes  mellitus without complication ([place])   . Family history of adverse reaction to anesthesia    mom- slow to awaken  . Fibromyalgia   . Foot fracture, left   . Gastroparesis   . Gastroparesis   . GERD (gastroesophageal reflux disease)   . Hearing loss   . Herniated cervical disc   . History of prescription drug abuse   . Neuromuscular disorder (HCC)    fibromyalgia/chronic fatique syndrome  . Neuropathy of foot   . OCD (obsessive compulsive disorder)   . 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 Plaque psoriasis   . PTSD (post-traumatic stress disorder)   . Stroke ([place])   . Ulcer     Past Surgical History:  Procedure Laterality Date  . CARPAL TUNNEL RELEASE Left 07/12/2015   Procedure: OPEN CARPAL TUNNEL RELEASE OF LEFT HAND;  Surgeon: Leanor Kail, MD;  Location: [place];  Service: Orthopedics;  Laterality: Left;  . cervical radiofrequency neurotomy    . FRACTURE SURGERY    . MOUTH SURGERY     upper and lower dentures  . NASAL SEPTUM SURGERY    . occipital nerve blocks    . SPINE SURGERY     cervical   Family History:  Family History  Problem Relation Age of Onset  . Cancer Mother 64       breast cancer  . Migraines Mother   . Hypertension Mother   . Arthritis Mother   .  Sleep apnea Mother   . Anxiety disorder Mother   . Depression Mother   . Diabetes Father   . Cancer Father 73       of spine , pancreas and liver  . Anxiety disorder Father   . Depression Father   . Diabetes Brother   . Hyperlipidemia Brother   . Sleep apnea Brother   . Cancer Maternal Grandfather        leukemia, prostate  . Cancer Paternal Grandfather        bone  . Cancer Maternal Aunt        breast  . Mental illness Maternal Grandmother        Dementia  . Stroke Paternal Grandmother   . Diabetes Brother   . Cancer Paternal Aunt        breast   Family Psychiatric   History: Mother with depression and anxiety Social History:  Social History   Substance and Sexual Activity  Alcohol Use No     Social History   Substance and Sexual Activity  Drug Use No    Social History   Socioeconomic History  . Marital status: Divorced    Spouse name: Not on file  . Number of children: Not on file  . Years of education: Not on file  . Highest education level: Not on file  Occupational History  . Not on file  Tobacco Use  . Smoking status: Current Every Day Smoker    Packs/day: 1.00    Years: 20.00    Pack years: 20.00    Types: Cigarettes    Start date: 03/01/2011  . Smokeless tobacco: Never Used  Vaping Use  . Vaping Use: Never used  Substance and Sexual Activity  . Alcohol use: No  . Drug use: No  . Sexual activity: Yes    Birth control/protection: None  Other Topics Concern  . Not on file  Social History Narrative   Depression- history of suicide attempt and hospitalization about 1998 after divorce   H/O hospitalization at [facility] for narcotics detox/withdrawal approx. 2000   Social Determinants of Health   Financial Resource Strain:   . Difficulty of Paying Living Expenses: Not on file  Food Insecurity:   . Worried About Charity fundraiser in the Last Year: Not on file  . Ran Out of Food in the Last Year: Not on file  Transportation Needs:   . Lack of Transportation (Medical): Not on file  . Lack of Transportation (Non-Medical): Not on file  Physical Activity:   . Days of Exercise per Week: Not on file  . Minutes of Exercise per Session: Not on file  Stress:   . Feeling of Stress : Not on file  Social Connections:   . Frequency of Communication with Friends and Family: Not on file  . Frequency of Social Gatherings with Friends and Family: Not on file  . Attends Religious Services: Not on file  . Active Member of Clubs or Organizations: Not on file  . Attends Archivist Meetings: Not on file  . Marital  Status: Not on file   Additional Social History:                         Sleep: Fair  Appetite:  Fair  Current Medications: Current Facility-Administered Medications  Medication Dose Route Frequency Provider Last Rate Last Admin  . acetaminophen (TYLENOL) tablet 650 mg  650 mg Oral Q6H PRN Lord,  Asa Saunas, NP   650 mg at 01/16/20 1815  . alum & mag hydroxide-simeth (MAALOX/MYLANTA) 200-200-20 MG/5ML suspension 30 mL  30 mL Oral Q4H PRN Patrecia Pour, NP      . aspirin EC tablet 81 mg  81 mg Oral Daily Patrecia Pour, NP   81 mg at 01/17/20 0747  . docusate sodium (COLACE) capsule 100 mg  100 mg Oral Daily PRN Salley Scarlet, MD   100 mg at 01/15/20 1248  . DULoxetine (CYMBALTA) DR capsule 60 mg  60 mg Oral Daily Salley Scarlet, MD   60 mg at 01/17/20 0747  . feeding supplement (ENSURE ENLIVE / ENSURE PLUS) liquid 237 mL  237 mL Oral BID BM Salley Scarlet, MD   237 mL at 01/17/20 1012  . haloperidol (HALDOL) tablet 2 mg  2 mg Oral Daily Salley Scarlet, MD   2 mg at 01/17/20 0747  . haloperidol (HALDOL) tablet 5 mg  5 mg Oral QAC supper Salley Scarlet, MD   5 mg at 01/16/20 1629  . hydrOXYzine (ATARAX/VISTARIL) tablet 25 mg  25 mg Oral TID PRN Salley Scarlet, MD   25 mg at 01/16/20 2109  . lisdexamfetamine (VYVANSE) capsule 40 mg  40 mg Oral Daily Patrecia Pour, NP   40 mg at 01/17/20 0747  . metFORMIN (GLUCOPHAGE) tablet 500 mg  500 mg Oral Q breakfast Patrecia Pour, NP   500 mg at 01/17/20 6004    Lab Results:  No results found for this or any previous visit (from the past 48 hour(s)).  Blood Alcohol level:  Lab Results  Component Value Date   ETH <10 59/97/7414    Metabolic Disorder Labs: Lab Results  Component Value Date   HGBA1C 5.7 (H) 01/10/2020   MPG 116.89 01/10/2020   MPG 117 02/12/2016   No results found for: PROLACTIN Lab Results  Component Value Date   CHOL 188 01/10/2020   TRIG 162 (H) 01/10/2020   HDL 31 (L) 01/10/2020   CHOLHDL  6.1 01/10/2020   VLDL 32 01/10/2020   LDLCALC 125 (H) 01/10/2020   LDLCALC 80 11/01/2019    Physical Findings: AIMS: Facial and Oral Movements Muscles of Facial Expression: None, normal Lips and Perioral Area: None, normal Jaw: None, normal Tongue: None, normal,Extremity Movements Upper (arms, wrists, hands, fingers): None, normal Lower (legs, knees, ankles, toes): None, normal, Trunk Movements Neck, shoulders, hips: None, normal, Overall Severity Severity of abnormal movements (highest score from questions above): None, normal Incapacitation due to abnormal movements: None, normal Patient's awareness of abnormal movements (rate only patient's report): No Awareness, Dental Status Current problems with teeth and/or dentures?: No Does patient usually wear dentures?: No  CIWA:    COWS:     Musculoskeletal: Strength & Muscle Tone: within normal limits Gait & Station: normal Patient leans: N/A  Psychiatric Specialty Exam: Physical Exam Vitals and nursing note reviewed.  Constitutional:      Appearance: Normal appearance.  HENT:     Head: Normocephalic and atraumatic.     Right Ear: External ear normal.     Left Ear: External ear normal.     Nose: Nose normal.     Mouth/Throat:     Mouth: Mucous membranes are moist.     Pharynx: Oropharynx is clear.  Eyes:     Extraocular Movements: Extraocular movements intact.     Conjunctiva/sclera: Conjunctivae normal.  Cardiovascular:     Rate and Rhythm: Normal rate and  regular rhythm.     Pulses: Normal pulses.  Pulmonary:     Effort: Pulmonary effort is normal.     Breath sounds: Normal breath sounds.  Abdominal:     General: Abdomen is flat.  Musculoskeletal:        General: No swelling. Normal range of motion.     Cervical back: Normal range of motion.  Skin:    General: Skin is dry.  Neurological:     General: No focal deficit present.     Mental Status: She is alert and oriented to person, place, and time.   Psychiatric:        Attention and Perception: She is inattentive.        Mood and Affect: Mood is depressed. Affect is not tearful.        Speech: Speech normal.        Behavior: Behavior is slowed.        Thought Content: Thought content does not include suicidal ideation.        Cognition and Memory: Cognition and memory normal.        Judgment: Judgment is impulsive.     Review of Systems  Constitutional: Positive for fatigue. Negative for appetite change.  HENT: Negative for nosebleeds and sore throat.   Eyes: Negative for photophobia and visual disturbance.  Respiratory: Negative for cough and shortness of breath.   Cardiovascular: Negative for chest pain and palpitations.  Gastrointestinal: Negative for constipation, diarrhea, nausea and vomiting.  Endocrine: Negative for cold intolerance and heat intolerance.  Genitourinary: Negative for difficulty urinating and dyspareunia.  Musculoskeletal: Negative for arthralgias and gait problem.  Skin: Negative for rash and wound.  Allergic/Immunologic: Positive for environmental allergies. Negative for food allergies.  Neurological: Negative for dizziness and headaches.  Hematological: Negative for adenopathy. Does not bruise/bleed easily.  Psychiatric/Behavioral: Positive for confusion, dysphoric mood and sleep disturbance. Negative for suicidal ideas. The patient is nervous/anxious.     Blood pressure 103/73, pulse 85, temperature 98.2 F (36.8 C), temperature source Oral, resp. rate 17, height 5\' 5"  (1.651 m), weight 79.8 kg, SpO2 97 %.Body mass index is 29.29 kg/m.  General Appearance: Disheveled  Eye Contact: Limited  Speech:  Clear and Coherent, short answers  Volume:  Normal  Mood:  Depressed and Dysphoric  Affect: Restricted  Thought Process:  Coherent  Orientation:  Ox2, alert  Thought Content: Concrete  Suicidal Thoughts:  No  Homicidal Thoughts:  No  Memory:  Immediate;   Fair Recent;   Poor Remote;   Poor   Judgement:  Impaired  Insight:  Present  Psychomotor Activity:  Decreased  Concentration:  Concentration: Fair and Attention Span: Fair  Recall:  [employer] of Knowledge:  Poor  Language:  Fair  Akathisia:  Negative  Handed:  Right  AIMS (if indicated):     Assets:  Desire for Improvement Financial Resources/Insurance Housing Resilience Social Support  ADL's:  Impaired  Cognition:  Impaired,  Mild  Sleep:  Number of Hours: 7.5     Treatment Plan Summary: Daily contact with patient to assess and evaluate symptoms and progress in treatment and Medication management Pt with depressive and psychotic features Delirium: Resolving- -Valium taper completed, discontinue today 01/12/20 -Klebsiella pneumoniae UTI noted on admission. Treated with one time dose of fosfomycin on 01/11/20.   Major depressive disorder, recurrent, severe with psychosis: - Continue Cymbalta 60 mg daily - Continue Haldol 2 mg in the morning, 5 mg in the evening  Attention issues: -  ContinueVyvanse 40 mg daily  Prediabetes - Continue metformin 500 mg with breakfast  Salley Scarlet, MD 01/17/2020, 1:08 PMPatient ID: [name], female   DOB: October 24, 1970, 49 y.o.   MRN: [id number] Patient ID: KALA AMBRIZ, female   DOB: 05-17-1970, 49 y.o.   MRN: [id number]

## 2020-01-17 NOTE — Plan of Care (Signed)
  Problem: Group Participation Goal: STG - Patient will engage in groups without prompting or encouragement from LRT x3 group sessions within 5 recreation therapy group sessions Description: STG - Patient will engage in groups without prompting or encouragement from LRT x3 group sessions within 5 recreation therapy group sessions Outcome: Not Progressing   

## 2020-01-17 NOTE — Progress Notes (Signed)
Recreation Therapy Notes   Date: 01/17/2020  Time: 9:30 am   Location: Craft room     Behavioral response: N/A   Intervention Topic: Goal Setting  Discussion/Intervention: Patient did not attend group.   Clinical Observations/Feedback:  Patient did not attend group.   Becky Hubbard LRT/CTRS        Becky Hubbard 01/17/2020 11:54 AM

## 2020-01-17 NOTE — Plan of Care (Signed)
Patient in & out of her room. Patient ignored staff when encouraging her for shower states " I will try." Insisted patient to set up a time for shower and patient stated 6:30 today.Denies SI,HI and AVH. Patient asked for anxiety medicine.When asked for any triggering reason patient states " I get anxious sometimes." Patient could not say reason.Compliant with medications.Appetite and energy level good.Support and encouragement given.

## 2020-01-17 NOTE — BHH Group Notes (Signed)
LCSW Group Therapy Note  01/17/2020 1:47 PM  Type of Therapy/Topic:  Group Therapy:  Emotion Regulation  Participation Level:  Did Not Attend   Description of Group:   The purpose of this group is to assist patients in learning to regulate negative emotions and experience positive emotions. Patients will be guided to discuss ways in which they have been vulnerable to their negative emotions. These vulnerabilities will be juxtaposed with experiences of positive emotions or situations, and patients will be challenged to use positive emotions to combat negative ones. Special emphasis will be placed on coping with negative emotions in conflict situations, and patients will process healthy conflict resolution skills.  Therapeutic Goals: 1. Patient will identify two positive emotions or experiences to reflect on in order to balance out negative emotions 2. Patient will label two or more emotions that they find the most difficult to experience 3. Patient will demonstrate positive conflict resolution skills through discussion and/or role plays  Summary of Patient Progress:  X  Therapeutic Modalities:   Cognitive Behavioral Therapy Feelings Identification Dialectical Behavioral Therapy  Assunta Curtis, MSW, LCSW 01/17/2020 1:47 PM

## 2020-01-18 MED ORDER — LISDEXAMFETAMINE DIMESYLATE 40 MG PO CAPS: 40.0000 mg | ORAL_CAPSULE | Freq: Every day | ORAL | 0 refills | Status: DC

## 2020-01-18 MED ORDER — METFORMIN HCL 500 MG PO TABS: 500.0000 mg | ORAL_TABLET | Freq: Every day | ORAL | 1 refills | Status: DC

## 2020-01-18 MED ORDER — HALOPERIDOL 5 MG PO TABS
5.0000 mg | ORAL_TABLET | Freq: Two times a day (BID) | ORAL | Status: DC
  Administered 2020-01-18 – 2020-01-19 (×2): 5 mg via ORAL
  Filled 2020-01-18 (×2): qty 1

## 2020-01-18 MED ORDER — DULOXETINE HCL 60 MG PO CPEP: 60.0000 mg | ORAL_CAPSULE | Freq: Every day | ORAL | 1 refills | Status: AC

## 2020-01-18 MED ORDER — ASPIRIN 81 MG PO TBEC: 81.0000 mg | DELAYED_RELEASE_TABLET | Freq: Every day | ORAL | 1 refills | Status: AC

## 2020-01-18 MED ORDER — HYDROXYZINE HCL 25 MG PO TABS: 25.0000 mg | ORAL_TABLET | Freq: Three times a day (TID) | ORAL | 1 refills | Status: DC | PRN

## 2020-01-18 MED ORDER — BUSPIRONE HCL 5 MG PO TABS: 5.0000 mg | ORAL_TABLET | Freq: Two times a day (BID) | ORAL | Status: DC

## 2020-01-18 MED ORDER — HALOPERIDOL 5 MG PO TABS: 5.0000 mg | ORAL_TABLET | Freq: Two times a day (BID) | ORAL | 1 refills | Status: AC

## 2020-01-18 NOTE — Progress Notes (Signed)
Pt is calm and cooperative with a worried effect. Pt took a shower today. Pt still remains mostly withdrawn, Pt was given PRNs. Pt discharging tomorrow.   Collier Bullock RN

## 2020-01-18 NOTE — BHH Group Notes (Signed)
LCSW Group Therapy Note  01/18/2020 1:24 PM  Type of Therapy/Topic:  Group Therapy:  Balance in Life  Participation Level:  Did Not Attend  Description of Group:    This group will address the concept of balance and how it feels and looks when one is unbalanced. Patients will be encouraged to process areas in their lives that are out of balance and identify reasons for remaining unbalanced. Facilitators will guide patients in utilizing problem-solving interventions to address and correct the stressor making their life unbalanced. Understanding and applying boundaries will be explored and addressed for obtaining and maintaining a balanced life. Patients will be encouraged to explore ways to assertively make their unbalanced needs known to significant others in their lives, using other group members and facilitator for support and feedback.  Therapeutic Goals: 1. Patient will identify two or more emotions or situations they have that consume much of in their lives. 2. Patient will identify signs/triggers that life has become out of balance:  3. Patient will identify two ways to set boundaries in order to achieve balance in their lives:  4. Patient will demonstrate ability to communicate their needs through discussion and/or role plays  Summary of Patient Progress: X  Therapeutic Modalities:   Cognitive Behavioral Therapy Solution-Focused Therapy Assertiveness Training  Hedy Camara R. Guerry Bruin, MSW, LCSW, [place] 01/18/2020 1:24 PM

## 2020-01-18 NOTE — Plan of Care (Signed)
  Problem: Education: Goal: Knowledge of [facility] Education information/materials will improve Outcome: Progressing Goal: Emotional status will improve Outcome: Progressing Goal: Mental status will improve Outcome: Progressing Goal: Verbalization of understanding the information provided will improve Outcome: Progressing   Problem: Activity: Goal: Interest or engagement in activities will improve Outcome: Progressing Goal: Sleeping patterns will improve Outcome: Progressing   Problem: Coping: Goal: Ability to verbalize frustrations and anger appropriately will improve Outcome: Progressing Goal: Ability to demonstrate self-control will improve Outcome: Progressing   Problem: Health Behavior/Discharge Planning: Goal: Identification of resources available to assist in meeting health care needs will improve Outcome: Progressing Goal: Compliance with treatment plan for underlying cause of condition will improve Outcome: Progressing   Problem: Physical Regulation: Goal: Ability to maintain clinical measurements within normal limits will improve Outcome: Progressing   Problem: Safety: Goal: Periods of time without injury will increase Outcome: Progressing   Problem: Activity: Goal: Will verbalize the importance of balancing activity with adequate rest periods Outcome: Progressing   Problem: Education: Goal: Will be free of psychotic symptoms Outcome: Progressing Goal: Knowledge of the prescribed therapeutic regimen will improve Outcome: Progressing   Problem: Coping: Goal: Coping ability will improve Outcome: Progressing Goal: Will verbalize feelings Outcome: Progressing   Problem: Health Behavior/Discharge Planning: Goal: Compliance with prescribed medication regimen will improve Outcome: Progressing   Problem: Nutritional: Goal: Ability to achieve adequate nutritional intake will improve Outcome: Progressing   Problem: Role Relationship: Goal:  Ability to communicate needs accurately will improve Outcome: Progressing Goal: Ability to interact with others will improve Outcome: Progressing   Problem: Safety: Goal: Ability to redirect hostility and anger into socially appropriate behaviors will improve Outcome: Progressing Goal: Ability to remain free from injury will improve Outcome: Progressing   Problem: Self-Care: Goal: Ability to participate in self-care as condition permits will improve Outcome: Progressing   Problem: Self-Concept: Goal: Will verbalize positive feelings about self Outcome: Progressing   

## 2020-01-18 NOTE — Progress Notes (Signed)
Recreation Therapy Notes  Date: 01/18/2020  Time: 9:30 am   Location: Craft room   Behavioral response: N/A   Intervention Topic: Animal Assisted therapy    Discussion/Intervention: Patient did not attend group.   Clinical Observations/Feedback:  Patient did not attend group.   Fatimah Sundquist LRT/CTRS        Kelbie Moro 01/18/2020 11:39 AM

## 2020-01-18 NOTE — Progress Notes (Signed)
Encouraged pt to take a shower once again. She said that she would try. Collier Bullock RN

## 2020-01-18 NOTE — Progress Notes (Signed)
[facility] MD Progress Note  01/18/2020 3:40 PM Becky Hubbard  MRN:  [id number]   Subjective and interval history- I'm doing fine"  Becky Hubbard a 49 y.o.femalepatient admitted with psychosis.She has a history of MDD with psychosis, ADD, head injury from [place] in 2008, and PTSD. Prior to admission she stopped performing ADLs, and behaving bizarrely. No acute events overnight. She has been medication compliant during admission.   Patient seen today for follow-up , chart reviewed discussed with nursing staff. Yesterday patient remained isolated to her room. Today patient is oriented x4, but continues to be child-like and guarded. This appears to be patient's baseline. She denies SI/HI/AH/VH, and has been medication compliant. With continued encouragement for past several days from staff, patient did shower and change her clothes today.   Contacted patient's mother, Becky Hubbard, at [phone] to provide updates. She notes she spoke with Becky Hubbard on the phone this morning and noted she seemed somewhat tired, but otherwise like herself. Becky Hubbard notes that Becky Hubbard agreed to let her manage the medications as part of conditions for returning home. We reviewed her current medication regimen of Vyvanse 40 mg daily, Haldol 5 mg BID, Cymbalta 60 mg daily, and metformin 500 mg daily. Prescriptions sent to [facility] per mother's request. She will be available to pick up Becky Hubbard tomorrow afternoon.     Principal Problem: Major depressive disorder, recurrent episode, severe, with psychosis ([place]) Diagnosis: Principal Problem:   Major depressive disorder, recurrent episode, severe, with psychosis ([place]) Active Problems:   Fibromyalgia syndrome   Auditory hallucinations   PTSD (post-traumatic stress disorder)   ADD (attention deficit disorder)   Borderline diabetes  Total Time spent with patient: 30 minutes  Past Psychiatric History: Previously seen by [facility] unit by Dr. Kasandra Knudsen, prescribed  Cymbalta 30 mg daily, Valium 10 mg TID, Vyvanse 40 mg daily. No prior inpatient admissions. No previous suicide attempts.   Past Medical History:  Past Medical History:  Diagnosis Date  . ADD (attention deficit disorder)   . Anxiety   . Arthritis   . Auditory hallucinations   . Carpal tunnel syndrome on both sides   . Cervical spine fracture ([place]) 2008   Closed with screw C2-3 [facility] Dr. Delilah Hubbard  . Degenerative joint disease of spine   . Depression   . Diabetes mellitus without complication ([place])   . Family history of adverse reaction to anesthesia    mom- slow to awaken  . Fibromyalgia   . Foot fracture, left   . Gastroparesis   . Gastroparesis   . GERD (gastroesophageal reflux disease)   . Hearing loss   . Herniated cervical disc   . History of prescription drug abuse   . Neuromuscular disorder (HCC)    fibromyalgia/chronic fatique syndrome  . Neuropathy of foot   . OCD (obsessive compulsive disorder)   . 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 Plaque psoriasis   . PTSD (post-traumatic stress disorder)   . Stroke ([place])   . Ulcer     Past Surgical History:  Procedure Laterality Date  . CARPAL TUNNEL RELEASE Left 07/12/2015   Procedure: OPEN CARPAL TUNNEL RELEASE OF LEFT HAND;  Surgeon: Leanor Kail, MD;  Location: [facility];  Service: Orthopedics;  Laterality: Left;  . cervical radiofrequency neurotomy    . FRACTURE SURGERY    . MOUTH SURGERY  upper and lower dentures  . NASAL SEPTUM SURGERY    . occipital nerve blocks    . SPINE SURGERY     cervical   Family History:  Family History  Problem Relation Age of Onset  . Cancer Mother 41       breast cancer  . Migraines Mother   . Hypertension Mother   . Arthritis Mother   . Sleep apnea Mother   . Anxiety disorder Mother   . Depression Mother   . Diabetes Father   . Cancer Father 7        of spine , pancreas and liver  . Anxiety disorder Father   . Depression Father   . Diabetes Brother   . Hyperlipidemia Brother   . Sleep apnea Brother   . Cancer Maternal Grandfather        leukemia, prostate  . Cancer Paternal Grandfather        bone  . Cancer Maternal Aunt        breast  . Mental illness Maternal Grandmother        Dementia  . Stroke Paternal Grandmother   . Diabetes Brother   . Cancer Paternal Aunt        breast   Family Psychiatric  History: Mother with depression and anxiety Social History:  Social History   Substance and Sexual Activity  Alcohol Use No     Social History   Substance and Sexual Activity  Drug Use No    Social History   Socioeconomic History  . Marital status: Divorced    Spouse name: Not on file  . Number of children: Not on file  . Years of education: Not on file  . Highest education level: Not on file  Occupational History  . Not on file  Tobacco Use  . Smoking status: Current Every Day Smoker    Packs/day: 1.00    Years: 20.00    Pack years: 20.00    Types: Cigarettes    Start date: 03/01/2011  . Smokeless tobacco: Never Used  Vaping Use  . Vaping Use: Never used  Substance and Sexual Activity  . Alcohol use: No  . Drug use: No  . Sexual activity: Yes    Birth control/protection: None  Other Topics Concern  . Not on file  Social History Narrative   Depression- history of suicide attempt and hospitalization about 1998 after divorce   H/O hospitalization at [facility] for narcotics detox/withdrawal approx. 2000   Social Determinants of Health   Financial Resource Strain: Not on file  Food Insecurity: Not on file  Transportation Needs: Not on file  Physical Activity: Not on file  Stress: Not on file  Social Connections: Not on file   Additional Social History:                         Sleep: Fair  Appetite:  Fair  Current Medications: Current Facility-Administered Medications   Medication Dose Route Frequency Provider Last Rate Last Admin  . acetaminophen (TYLENOL) tablet 650 mg  650 mg Oral Q6H PRN Patrecia Pour, NP   650 mg at 01/18/20 1230  . alum & mag hydroxide-simeth (MAALOX/MYLANTA) 200-200-20 MG/5ML suspension 30 mL  30 mL Oral Q4H PRN Patrecia Pour, NP      . aspirin EC tablet 81 mg  81 mg Oral Daily Patrecia Pour, NP   81 mg at 01/18/20 0820  . docusate sodium (COLACE)  capsule 100 mg  100 mg Oral Daily PRN Salley Scarlet, MD   100 mg at 01/15/20 1248  . DULoxetine (CYMBALTA) DR capsule 60 mg  60 mg Oral Daily Salley Scarlet, MD   60 mg at 01/18/20 0820  . feeding supplement (ENSURE ENLIVE / ENSURE PLUS) liquid 237 mL  237 mL Oral BID BM Salley Scarlet, MD   237 mL at 01/18/20 1100  . haloperidol (HALDOL) tablet 5 mg  5 mg Oral BID Salley Scarlet, MD      . hydrOXYzine (ATARAX/VISTARIL) tablet 25 mg  25 mg Oral TID PRN Salley Scarlet, MD   25 mg at 01/18/20 4401  . lisdexamfetamine (VYVANSE) capsule 40 mg  40 mg Oral Daily Patrecia Pour, NP   40 mg at 01/18/20 0820  . metFORMIN (GLUCOPHAGE) tablet 500 mg  500 mg Oral Q breakfast Patrecia Pour, NP   500 mg at 01/18/20 0272    Lab Results:  No results found for this or any previous visit (from the past 48 hour(s)).  Blood Alcohol level:  Lab Results  Component Value Date   ETH <10 53/66/4403    Metabolic Disorder Labs: Lab Results  Component Value Date   HGBA1C 5.7 (H) 01/10/2020   MPG 116.89 01/10/2020   MPG 117 02/12/2016   No results found for: PROLACTIN Lab Results  Component Value Date   CHOL 188 01/10/2020   TRIG 162 (H) 01/10/2020   HDL 31 (L) 01/10/2020   CHOLHDL 6.1 01/10/2020   VLDL 32 01/10/2020   LDLCALC 125 (H) 01/10/2020   LDLCALC 80 11/01/2019    Physical Findings: AIMS: Facial and Oral Movements Muscles of Facial Expression: None, normal Lips and Perioral Area: None, normal Jaw: None, normal Tongue: None, normal,Extremity Movements Upper (arms,  wrists, hands, fingers): None, normal Lower (legs, knees, ankles, toes): None, normal, Trunk Movements Neck, shoulders, hips: None, normal, Overall Severity Severity of abnormal movements (highest score from questions above): None, normal Incapacitation due to abnormal movements: None, normal Patient's awareness of abnormal movements (rate only patient's report): No Awareness, Dental Status Current problems with teeth and/or dentures?: No Does patient usually wear dentures?: No  CIWA:    COWS:     Musculoskeletal: Strength & Muscle Tone: within normal limits Gait & Station: normal Patient leans: N/A  Psychiatric Specialty Exam: Physical Exam Vitals and nursing note reviewed.  Constitutional:      Appearance: Normal appearance.  HENT:     Head: Normocephalic and atraumatic.     Right Ear: External ear normal.     Left Ear: External ear normal.     Nose: Nose normal.     Mouth/Throat:     Mouth: Mucous membranes are moist.     Pharynx: Oropharynx is clear.  Eyes:     Extraocular Movements: Extraocular movements intact.     Conjunctiva/sclera: Conjunctivae normal.  Cardiovascular:     Rate and Rhythm: Normal rate and regular rhythm.     Pulses: Normal pulses.  Pulmonary:     Effort: Pulmonary effort is normal.     Breath sounds: Normal breath sounds.  Abdominal:     General: Abdomen is flat.  Musculoskeletal:        General: No swelling. Normal range of motion.     Cervical back: Normal range of motion.  Skin:    General: Skin is dry.  Neurological:     General: No focal deficit present.     Mental Status:  She is alert and oriented to person, place, and time.  Psychiatric:        Attention and Perception: She is inattentive.        Mood and Affect: Mood is depressed. Affect is not tearful.        Speech: Speech normal.        Behavior: Behavior is slowed.        Thought Content: Thought content does not include suicidal ideation.        Cognition and Memory:  Cognition and memory normal.        Judgment: Judgment is impulsive.     Review of Systems  Constitutional: Positive for fatigue. Negative for appetite change.  HENT: Negative for nosebleeds and sore throat.   Eyes: Negative for photophobia and visual disturbance.  Respiratory: Negative for cough and shortness of breath.   Cardiovascular: Negative for chest pain and palpitations.  Gastrointestinal: Negative for constipation, diarrhea, nausea and vomiting.  Endocrine: Negative for cold intolerance and heat intolerance.  Genitourinary: Negative for difficulty urinating and dyspareunia.  Musculoskeletal: Negative for arthralgias and gait problem.  Skin: Negative for rash and wound.  Allergic/Immunologic: Positive for environmental allergies. Negative for food allergies.  Neurological: Negative for dizziness and headaches.  Hematological: Negative for adenopathy. Does not bruise/bleed easily.  Psychiatric/Behavioral: Positive for confusion, dysphoric mood and sleep disturbance. Negative for suicidal ideas. The patient is nervous/anxious.     Blood pressure 111/78, pulse (!) 104, temperature 99.1 F (37.3 C), temperature source Oral, resp. rate 18, height 5\' 5"  (1.651 m), weight 79.8 kg, SpO2 99 %.Body mass index is 29.29 kg/m.  General Appearance: Disheveled  Eye Contact: Limited  Speech:  Clear and Coherent, short answers  Volume:  Normal  Mood:  Depressed and Dysphoric  Affect: Restricted  Thought Process:  Coherent  Orientation:  Ox2, alert  Thought Content: [place]  Suicidal Thoughts:  No  Homicidal Thoughts:  No  Memory:  Immediate;   Fair Recent;   Poor Remote;   Poor  Judgement:  Impaired  Insight:  Present  Psychomotor Activity:  Decreased  Concentration:  Concentration: Fair and Attention Span: Fair  Recall:  [employer] of Knowledge:  Poor  Language:  Fair  Akathisia:  Negative  Handed:  Right  AIMS (if indicated):     Assets:  Desire for Improvement Financial  Resources/Insurance Housing Resilience Social Support  ADL's:  Impaired  Cognition:  Impaired,  Mild  Sleep:  Number of Hours: 8     Treatment Plan Summary: Daily contact with patient to assess and evaluate symptoms and progress in treatment and Medication management Pt with depressive and psychotic features Delirium: Resolving- -Valium taper completed, discontinue today 01/12/20 -Klebsiella pneumoniae UTI noted on admission. Treated with one time dose of fosfomycin on 01/11/20.   Major depressive disorder, recurrent, severe with psychosis: - Continue Cymbalta 60 mg daily - Haldol 5 mg BID  Attention issues: -ContinueVyvanse 40 mg daily  Prediabetes - Continue metformin 500 mg with breakfast  Salley Scarlet, MD 01/18/2020, 3:40 PMPatient ID: Becky Hubbard, female   DOB: 1971/01/03, 49 y.o.   MRN: [id number] Patient ID: Becky Hubbard, female   DOB: 1970-07-03, 49 y.o.   MRN: [id number]

## 2020-01-18 NOTE — Tx Team (Signed)
Interdisciplinary Treatment and Diagnostic Plan Update  01/18/2020 Time of Session: 8:30 AM  Becky Hubbard MRN: [id number]  Principal Diagnosis: Major depressive disorder, recurrent episode, severe, with psychosis ([place])  Secondary Diagnoses: Principal Problem:   Major depressive disorder, recurrent episode, severe, with psychosis ([place]) Active Problems:   Fibromyalgia syndrome   Auditory hallucinations   PTSD (post-traumatic stress disorder)   ADD (attention deficit disorder)   Borderline diabetes   Current Medications:  Current Facility-Administered Medications  Medication Dose Route Frequency Provider Last Rate Last Admin  . acetaminophen (TYLENOL) tablet 650 mg  650 mg Oral Q6H PRN Patrecia Pour, NP   650 mg at 01/18/20 6811  . alum & mag hydroxide-simeth (MAALOX/MYLANTA) 200-200-20 MG/5ML suspension 30 mL  30 mL Oral Q4H PRN Patrecia Pour, NP      . aspirin EC tablet 81 mg  81 mg Oral Daily Patrecia Pour, NP   81 mg at 01/18/20 0820  . docusate sodium (COLACE) capsule 100 mg  100 mg Oral Daily PRN Salley Scarlet, MD   100 mg at 01/15/20 1248  . DULoxetine (CYMBALTA) DR capsule 60 mg  60 mg Oral Daily Salley Scarlet, MD   60 mg at 01/18/20 0820  . feeding supplement (ENSURE ENLIVE / ENSURE PLUS) liquid 237 mL  237 mL Oral BID BM Salley Scarlet, MD   237 mL at 01/17/20 1413  . haloperidol (HALDOL) tablet 2 mg  2 mg Oral Daily Salley Scarlet, MD   2 mg at 01/18/20 0819  . haloperidol (HALDOL) tablet 5 mg  5 mg Oral QAC supper Salley Scarlet, MD   5 mg at 01/17/20 1612  . hydrOXYzine (ATARAX/VISTARIL) tablet 25 mg  25 mg Oral TID PRN Salley Scarlet, MD   25 mg at 01/18/20 5726  . lisdexamfetamine (VYVANSE) capsule 40 mg  40 mg Oral Daily Patrecia Pour, NP   40 mg at 01/18/20 0820  . metFORMIN (GLUCOPHAGE) tablet 500 mg  500 mg Oral Q breakfast Patrecia Pour, NP   500 mg at 01/18/20 2035   PTA Medications: Medications Prior to Admission  Medication Sig Dispense  Refill Last Dose  . aspirin 81 MG tablet Take 81 mg by mouth daily.     . busPIRone (BUSPAR) 15 MG tablet Take 15 mg by mouth 2 (two) times daily.      . cyclobenzaprine (FLEXERIL) 10 MG tablet Take 5-10 mg by mouth at bedtime as needed for muscle spasms.     . diazepam (VALIUM) 10 MG tablet Take 10 mg by mouth 3 (three) times daily as needed for anxiety.   1   . DULoxetine (CYMBALTA) 60 MG capsule Take 60 mg by mouth 2 (two) times daily.   4   . EPINEPHrine 0.3 mg/0.3 mL IJ SOAJ injection Inject 0.3 mg into the muscle as needed for anaphylaxis. 1 each 0   . etonogestrel (NEXPLANON) 68 MG IMPL implant 1 each (68 mg total) by Subdermal route once for 1 dose. 1 each 0   . fluticasone (FLONASE) 50 MCG/ACT nasal spray Place 2 sprays into both nostrils daily.      . haloperidol (HALDOL) 2 MG tablet Take 2 mg by mouth at bedtime.      . metFORMIN (GLUCOPHAGE) 500 MG tablet Take 500 mg by mouth daily.      . pantoprazole (PROTONIX) 40 MG tablet TAKE ONE TABLET EVERY DAY (Patient taking differently: Take 40 mg by mouth daily. )  30 tablet 3   . prazosin (MINIPRESS) 2 MG capsule Take 2 mg by mouth at bedtime.     Marland Kitchen PROAIR HFA 108 (90 Base) MCG/ACT inhaler Inhale 2 puffs into the lungs every 6 (six) hours as needed for wheezing or shortness of breath.      Marland Kitchen VYVANSE 40 MG capsule Take 40 mg by mouth every morning.     . ziprasidone (GEODON) 40 MG capsule Take 40 mg by mouth daily with supper.     . zolpidem (AMBIEN) 10 MG tablet Take 10 mg by mouth at bedtime.       Patient Stressors: Health problems  Patient Strengths: Supportive family/friends  Treatment Modalities: Medication Management, Group therapy, Case management,  1 to 1 session with clinician, Psychoeducation, Recreational therapy.   Physician Treatment Plan for Primary Diagnosis: Major depressive disorder, recurrent episode, severe, with psychosis ([place]) Long Term Goal(s): Improvement in symptoms so as ready for discharge Improvement in  symptoms so as ready for discharge   Short Term Goals: Ability to identify changes in lifestyle to reduce recurrence of condition will improve Ability to verbalize feelings will improve Ability to disclose and discuss suicidal ideas Ability to demonstrate self-control will improve Ability to identify and develop effective coping behaviors will improve Compliance with prescribed medications will improve Ability to identify changes in lifestyle to reduce recurrence of condition will improve Ability to verbalize feelings will improve Ability to disclose and discuss suicidal ideas Ability to demonstrate self-control will improve Ability to identify and develop effective coping behaviors will improve Compliance with prescribed medications will improve  Medication Management: Evaluate patient's response, side effects, and tolerance of medication regimen.  Therapeutic Interventions: 1 to 1 sessions, Unit Group sessions and Medication administration.  Evaluation of Outcomes: Progressing  Physician Treatment Plan for Secondary Diagnosis: Principal Problem:   Major depressive disorder, recurrent episode, severe, with psychosis ([place]) Active Problems:   Fibromyalgia syndrome   Auditory hallucinations   PTSD (post-traumatic stress disorder)   ADD (attention deficit disorder)   Borderline diabetes  Long Term Goal(s): Improvement in symptoms so as ready for discharge Improvement in symptoms so as ready for discharge   Short Term Goals: Ability to identify changes in lifestyle to reduce recurrence of condition will improve Ability to verbalize feelings will improve Ability to disclose and discuss suicidal ideas Ability to demonstrate self-control will improve Ability to identify and develop effective coping behaviors will improve Compliance with prescribed medications will improve Ability to identify changes in lifestyle to reduce recurrence of condition will improve Ability to verbalize  feelings will improve Ability to disclose and discuss suicidal ideas Ability to demonstrate self-control will improve Ability to identify and develop effective coping behaviors will improve Compliance with prescribed medications will improve     Medication Management: Evaluate patient's response, side effects, and tolerance of medication regimen.  Therapeutic Interventions: 1 to 1 sessions, Unit Group sessions and Medication administration.  Evaluation of Outcomes: Progressing   RN Treatment Plan for Primary Diagnosis: Major depressive disorder, recurrent episode, severe, with psychosis ([place]) Long Term Goal(s): Knowledge of disease and therapeutic regimen to maintain health will improve  Short Term Goals: Ability to demonstrate self-control, Ability to participate in decision making will improve, Ability to verbalize feelings will improve, Ability to identify and develop effective coping behaviors will improve and Compliance with prescribed medications will improve  Medication Management: RN will administer medications as ordered by provider, will assess and evaluate patient's response and provide education to patient for prescribed medication.  RN will report any adverse and/or side effects to prescribing provider.  Therapeutic Interventions: 1 on 1 counseling sessions, Psychoeducation, Medication administration, Evaluate responses to treatment, Monitor vital signs and CBGs as ordered, Perform/monitor CIWA, COWS, AIMS and Fall Risk screenings as ordered, Perform wound care treatments as ordered.  Evaluation of Outcomes: Progressing   LCSW Treatment Plan for Primary Diagnosis: Major depressive disorder, recurrent episode, severe, with psychosis ([place]) Long Term Goal(s): Safe transition to appropriate next level of care at discharge, Engage patient in therapeutic group addressing interpersonal concerns.  Short Term Goals: Engage patient in aftercare planning with referrals and resources,  Increase social support, Increase ability to appropriately verbalize feelings, Increase emotional regulation, Facilitate acceptance of mental health diagnosis and concerns, Identify triggers associated with mental health/substance abuse issues and Increase skills for wellness and recovery  Therapeutic Interventions: Assess for all discharge needs, 1 to 1 time with Social worker, Explore available resources and support systems, Assess for adequacy in community support network, Educate family and significant other(s) on suicide prevention, Complete Psychosocial Assessment, Interpersonal group therapy.  Evaluation of Outcomes: Progressing   Progress in Treatment: Attending groups: No. Participating in groups: No. Taking medication as prescribed: Yes. Toleration medication: Yes. Family/Significant other contact made: Yes, individual(s) contacted:  [name], Mother  Patient understands diagnosis: No. Discussing patient identified problems/goals with staff: No. Medical problems stabilized or resolved: Yes. Denies suicidal/homicidal ideation: Yes. Issues/concerns per patient self-inventory: No. Other: None   New problem(s) identified: No, Describe:  None   New Short Term/Long Term Goal(s): Elimination of symptoms of psychosis, medication management for mood stabilization; development of comprehensive mental wellness plan. Update 01/13/20: No changes at this time Update 01/18/20: Taking a shower/care of hygiene has been identified as a marker for progress which patient has yet to meet.   Patient Goals:  "I want to go home."  Update 01/13/20: No changes at this time Update 01/18/20: No changes at this time.  Discharge Plan or Barriers: CSW will assist pt with development of aftercare plan. Update 01/13/20: No changes at this time Update 01/18/20: No changes at this time.  Reason for Continuation of Hospitalization: Delusions  Medication stabilization  Estimated Length of Stay: 1-7 days    Attendees: Patient: 01/18/2020 10:13 AM  Physician: Selina Cooley, MD 01/18/2020 10:13 AM  Nursing:  01/18/2020 10:13 AM  RN Care Manager: 01/18/2020 10:13 AM  Social Worker: Chalmers Guest. Guerry Bruin, MSW, LCSW, [place] 01/18/2020 10:13 AM  Recreational Therapist:  01/18/2020 10:13 AM  Other: Assunta Curtis, MSW, LCSW 01/18/2020 10:13 AM  Other: Paulla Dolly, MSW, [place], [place] 01/18/2020 10:13 AM  Other: 01/18/2020 10:13 AM    Scribe for Treatment Team: Shirl Harris, LCSW 01/18/2020 10:13 AM

## 2020-01-19 NOTE — Plan of Care (Signed)
  Problem: Education: Goal: Mental status will improve Outcome: Progressing   Problem: Safety: Goal: Periods of time without injury will increase Outcome: Progressing   Problem: Education: Goal: Will be free of psychotic symptoms Outcome: Progressing

## 2020-01-19 NOTE — Plan of Care (Signed)
  Problem: Group Participation Goal: STG - Patient will engage in groups without prompting or encouragement from LRT x3 group sessions within 5 recreation therapy group sessions Description: STG - Patient will engage in groups without prompting or encouragement from LRT x3 group sessions within 5 recreation therapy group sessions 01/19/2020 1444 by Ernest Haber, LRT Outcome: Not Applicable 79/15/0569 [id number] by Ernest Haber, LRT Outcome: Not Met (add Reason) Note: Patient did not attend any groups.

## 2020-01-19 NOTE — Discharge Summary (Signed)
Physician Discharge Summary Note  Patient:  Becky Hubbard is an 49 y.o., female MRN:  [id number] DOB:  10/06/1970 Patient phone:  [phone] (home)  Patient address:   [address] [place] [address],  Total Time spent with patient: 30 minutes  Date of Admission:  01/07/2020 Date of Discharge: 01/19/2020  Reason for Admission:  Becky Hubbard a 49 y.o.femalepatient admitted with psychosis.She has a history of MDD with psychosis, ADD, head injury from [place] in 2008, and PTSD. Prior to admission she stopped performing ADLs, and behaving bizarrely.  Principal Problem: Major depressive disorder, recurrent episode, severe, with psychosis ([place]) Discharge Diagnoses: Principal Problem:   Major depressive disorder, recurrent episode, severe, with psychosis ([place]) Active Problems:   Fibromyalgia syndrome   PTSD (post-traumatic stress disorder)   ADD (attention deficit disorder)   Borderline diabetes   Past Psychiatric History: Previously seen by [facility] unit by Dr. Kasandra Knudsen, prescribed Cymbalta 30 mg daily, Valium 10 mg TID, Vyvanse 40 mg daily. No prior inpatient admissions. No previous suicide attempts.   Past Medical History:  Past Medical History:  Diagnosis Date  . ADD (attention deficit disorder)   . Anxiety   . Arthritis   . Auditory hallucinations   . Carpal tunnel syndrome on both sides   . Cervical spine fracture ([place]) 2008   Closed with screw C2-3 [facility] Dr. Delilah Shan  . Degenerative joint disease of spine   . Depression   . Diabetes mellitus without complication ([place])   . Family history of adverse reaction to anesthesia    mom- slow to awaken  . Fibromyalgia   . Foot fracture, left   . Gastroparesis   . Gastroparesis   . GERD (gastroesophageal reflux disease)   . Hearing loss   . Herniated cervical disc   . History of prescription drug abuse   . Neuromuscular disorder (HCC)    fibromyalgia/chronic fatique syndrome  . Neuropathy of foot   . OCD  (obsessive compulsive disorder)   . 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 Plaque psoriasis   . PTSD (post-traumatic stress disorder)   . Stroke ([place])   . Ulcer     Past Surgical History:  Procedure Laterality Date  . CARPAL TUNNEL RELEASE Left 07/12/2015   Procedure: OPEN CARPAL TUNNEL RELEASE OF LEFT HAND;  Surgeon: Leanor Kail, MD;  Location: [place];  Service: Orthopedics;  Laterality: Left;  . cervical radiofrequency neurotomy    . FRACTURE SURGERY    . MOUTH SURGERY     upper and lower dentures  . NASAL SEPTUM SURGERY    . occipital nerve blocks    . SPINE SURGERY     cervical   Family History:  Family History  Problem Relation Age of Onset  . Cancer Mother 58       breast cancer  . Migraines Mother   . Hypertension Mother   . Arthritis Mother   . Sleep apnea Mother   . Anxiety disorder Mother   . Depression Mother   . Diabetes Father   . Cancer Father 43       of spine , pancreas and liver  . Anxiety disorder Father   . Depression Father   . Diabetes Brother   . Hyperlipidemia Brother   . Sleep apnea Brother   . Cancer Maternal Grandfather  leukemia, prostate  . Cancer Paternal Grandfather        bone  . Cancer Maternal Aunt        breast  . Mental illness Maternal Grandmother        Dementia  . Stroke Paternal Grandmother   . Diabetes Brother   . Cancer Paternal Aunt        breast   Family Psychiatric  History: Mother with depression and anxiety Social History:  Social History   Substance and Sexual Activity  Alcohol Use No     Social History   Substance and Sexual Activity  Drug Use No    Social History   Socioeconomic History  . Marital status: Divorced    Spouse name: Not on file  . Number of children: Not on file  . Years of education: Not on file  . Highest education level: Not on file  Occupational  History  . Not on file  Tobacco Use  . Smoking status: Current Every Day Smoker    Packs/day: 1.00    Years: 20.00    Pack years: 20.00    Types: Cigarettes    Start date: 03/01/2011  . Smokeless tobacco: Never Used  Vaping Use  . Vaping Use: Never used  Substance and Sexual Activity  . Alcohol use: No  . Drug use: No  . Sexual activity: Yes    Birth control/protection: None  Other Topics Concern  . Not on file  Social History Narrative   Depression- history of suicide attempt and hospitalization about 1998 after divorce   H/O hospitalization at [facility] for narcotics detox/withdrawal approx. 2000   Social Determinants of Health   Financial Resource Strain: Not on file  Food Insecurity: Not on file  Transportation Needs: Not on file  Physical Activity: Not on file  Stress: Not on file  Social Connections: Not on file    Hospital Course:  Becky Hubbard a 49 y.o.femalepatient admitted with psychosis.She has a history of MDD with psychosis, ADD, head injury from [place] in 2008, and PTSD. Prior to admission she stopped performing ADLs, and behaving bizarrely. During admission patient urinated on the floor in the hallway, and appeared to be unaware she was doing so. She was hyperreligious stating she was the [facility] and the [place]. Her orientation waxed and waned suggesting delirium. Her medical work-up was signficant for UTI which was treated with one time dose of Fosfomycin. Per mother, Becky Hubbard had an empty bottle of valium that had been filled less than two weeks prior to admission. There was reason to believe benzodiazepine abuse also led to delirium. Valium was tapered off and discontinued during admission. Her hyperreligious nature resolved during hospital stay, and she became consistently oriented x4. Her cymbalta was increased to 60 mg daily for depression and anxiety, and Haldol increased to 5 mg BID for confusion and paranoia. She was given hydroxyzine  25 mg TID PRN for anxiety, and continued on metformin 500 mg daily with breakfast. Patient eventually took a shower and changed her clothing with consistent prompting from staff. Team was in contact with patient's mother during admission. At time of discharge mother planned to be in charge of administering medications. Current regimen relayed to mother, and sent to [facility]. At this time, Becky Hubbard remains her own gaurdian. She declined referrals to group home, ALF, or ACT Team. Becky Hubbard states she wishes to follow-up with Dr. Kasandra Knudsen from [facility]. She denies suicidal ideations, homicidal ideations,  visual hallucinations, and auditory hallucinations at this time. Treatment team feels she is safe to discharge with outpatient follow-up.   Physical Findings: AIMS: Facial and Oral Movements Muscles of Facial Expression: None, normal Lips and Perioral Area: None, normal Jaw: None, normal Tongue: None, normal,Extremity Movements Upper (arms, wrists, hands, fingers): None, normal Lower (legs, knees, ankles, toes): None, normal, Trunk Movements Neck, shoulders, hips: None, normal, Overall Severity Severity of abnormal movements (highest score from questions above): None, normal Incapacitation due to abnormal movements: None, normal Patient's awareness of abnormal movements (rate only patient's report): No Awareness, Dental Status Current problems with teeth and/or dentures?: No Does patient usually wear dentures?: No  CIWA:    COWS:     Musculoskeletal: Strength & Muscle Tone: within normal limits Gait & Station: normal Patient leans: N/A  Psychiatric Specialty Exam: Physical Exam Vitals and nursing note reviewed.  Constitutional:      Appearance: Normal appearance.  HENT:     Head: Normocephalic and atraumatic.     Right Ear: External ear normal.     Left Ear: External ear normal.     Nose: Nose normal.     Mouth/Throat:     Mouth: Mucous membranes are moist.      Pharynx: Oropharynx is clear.  Eyes:     Extraocular Movements: Extraocular movements intact.     Conjunctiva/sclera: Conjunctivae normal.     Pupils: Pupils are equal, round, and reactive to light.  Cardiovascular:     Rate and Rhythm: Normal rate.     Pulses: Normal pulses.  Pulmonary:     Effort: Pulmonary effort is normal.     Breath sounds: Normal breath sounds.  Abdominal:     General: Abdomen is flat.     Palpations: Abdomen is soft.  Musculoskeletal:        General: No swelling. Normal range of motion.     Cervical back: Normal range of motion and neck supple.  Skin:    General: Skin is warm and dry.  Neurological:     General: No focal deficit present.     Mental Status: She is alert and oriented to person, place, and time.  Psychiatric:        Mood and Affect: Mood normal.        Behavior: Behavior normal.        Thought Content: Thought content normal.        Judgment: Judgment normal.     Review of Systems  Constitutional: Negative for activity change and fatigue.  HENT: Negative for rhinorrhea and sore throat.   Eyes: Negative for photophobia and visual disturbance.  Respiratory: Negative for cough and shortness of breath.   Cardiovascular: Negative for chest pain and palpitations.  Gastrointestinal: Negative for constipation, diarrhea, nausea and vomiting.  Endocrine: Negative for cold intolerance and heat intolerance.  Genitourinary: Negative for difficulty urinating and dysuria.  Musculoskeletal: Negative for arthralgias and myalgias.  Skin: Negative for rash and wound.  Allergic/Immunologic: Negative for food allergies and immunocompromised state.  Neurological: Negative for dizziness and headaches.  Hematological: Negative for adenopathy. Does not bruise/bleed easily.  Psychiatric/Behavioral: Negative for agitation, confusion, hallucinations and suicidal ideas.    Blood pressure 105/70, pulse 92, temperature 97.8 F (36.6 C), temperature source Oral,  resp. rate 18, height 5\' 5"  (1.651 m), weight 79.8 kg, SpO2 100 %.Body mass index is 29.29 kg/m.  General Appearance: Fairly Groomed  Engineer, water::  Good  Speech:  Clear and Coherent  Volume:  Normal  Mood:  Euthymic  Affect:  Congruent  Thought Process:  Coherent  Orientation:  Full (Time, Place, and Person)  Thought Content:  Logical  Suicidal Thoughts:  No  Homicidal Thoughts:  No  Memory:  Immediate;   Fair Recent;   Fair Remote;   Fair  Judgement:  Intact  Insight:  Shallow  Psychomotor Activity:  Normal  Concentration:  Fair  Recall:  Becky Hubbard  Language: Fair  Akathisia:  Negative  Handed:  Right  AIMS (if indicated):     Assets:  Communication Skills Desire for Improvement Financial Resources/Insurance Housing Physical Health Resilience Social Support  Sleep:  Number of Hours: 7.75  Cognition: WNL  ADL's:  Intact        Have you used any form of tobacco in the last 30 days? (Cigarettes, Smokeless Tobacco, Cigars, and/or Pipes): No  Has this patient used any form of tobacco in the last 30 days? (Cigarettes, Smokeless Tobacco, Cigars, and/or Pipes) No  Blood Alcohol level:  Lab Results  Component Value Date   ETH <10 09/22/4816    Metabolic Disorder Labs:  Lab Results  Component Value Date   HGBA1C 5.7 (H) 01/10/2020   MPG 116.89 01/10/2020   MPG 117 02/12/2016   No results found for: PROLACTIN Lab Results  Component Value Date   CHOL 188 01/10/2020   TRIG 162 (H) 01/10/2020   HDL 31 (L) 01/10/2020   CHOLHDL 6.1 01/10/2020   VLDL 32 01/10/2020   LDLCALC 125 (H) 01/10/2020   Many 80 11/01/2019    See Psychiatric Specialty Exam and Suicide Risk Assessment completed by Attending Physician prior to discharge.  Discharge destination:  Home  Is patient on multiple antipsychotic therapies at discharge:  No   Has Patient had three or more failed trials of antipsychotic monotherapy by history:  No  Recommended Plan for  Multiple Antipsychotic Therapies: NA  Discharge Instructions    Diet general   Complete by: As directed    Increase activity slowly   Complete by: As directed      Allergies as of 01/19/2020      Reactions   Doxycycline    Lyrica [pregabalin] Swelling   Ankles swell and turn red   Neurontin [gabapentin]    Chantix [varenicline] Rash   Prednisone Other (See Comments)   Other Other (See Comments)   Cats - cough/sneezing   Penicillin G Hives   Penicillins Hives   Has patient had a PCN reaction causing immediate rash, facial/tongue/throat swelling, SOB or lightheadedness with hypotension: No Has patient had a PCN reaction causing severe rash involving mucus membranes or skin necrosis: No Has patient had a PCN reaction that required hospitalization No Has patient had a PCN reaction occurring within the last 10 years: No If all of the above answers are "NO", then may proceed with Cephalosporin use.   Sulfa Antibiotics Hives, Rash, Other (See Comments)      Medication List    STOP taking these medications   aspirin 81 MG tablet Replaced by: aspirin 81 MG EC tablet   busPIRone 15 MG tablet Commonly known as: BUSPAR   cyclobenzaprine 10 MG tablet Commonly known as: FLEXERIL   diazepam 10 MG tablet Commonly known as: VALIUM   prazosin 2 MG capsule Commonly known as: MINIPRESS   ziprasidone 40 MG capsule Commonly known as: GEODON   zolpidem 10 MG tablet Commonly known as: AMBIEN     TAKE these medications     Indication  aspirin 81 MG EC tablet Take 1 tablet (81 mg total) by mouth daily. Swallow whole. Replaces: aspirin 81 MG tablet  Indication: Stable Angina Pectoris   DULoxetine 60 MG capsule Commonly known as: CYMBALTA Take 1 capsule (60 mg total) by mouth daily. What changed: when to take this  Indication: Major Depressive Disorder   EPINEPHrine 0.3 mg/0.3 mL Soaj injection Commonly known as: EPI-PEN Inject 0.3 mg into the muscle as needed for  anaphylaxis.  Indication: Life-Threatening Hypersensitivity Reaction   fluticasone 50 MCG/ACT nasal spray Commonly known as: FLONASE Place 2 sprays into both nostrils daily.  Indication: Allergic Rhinitis   haloperidol 5 MG tablet Commonly known as: HALDOL Take 1 tablet (5 mg total) by mouth 2 (two) times daily. What changed:   medication strength  how much to take  when to take this  Indication: MIXED BIPOLAR AFFECTIVE DISORDER   hydrOXYzine 25 MG tablet Commonly known as: ATARAX/VISTARIL Take 1 tablet (25 mg total) by mouth 3 (three) times daily as needed for anxiety.  Indication: Feeling Anxious   lisdexamfetamine 40 MG capsule Commonly known as: VYVANSE Take 1 capsule (40 mg total) by mouth daily. What changed: when to take this  Indication: Attention Deficit Hyperactivity Disorder   metFORMIN 500 MG tablet Commonly known as: GLUCOPHAGE Take 1 tablet (500 mg total) by mouth daily with breakfast. What changed: when to take this  Indication: Antipsychotic Therapy-Induced Weight Gain   Nexplanon 68 MG Impl implant Generic drug: etonogestrel 1 each (68 mg total) by Subdermal route once for 1 dose.  Indication: Birth Control Treatment   pantoprazole 40 MG tablet Commonly known as: PROTONIX TAKE ONE TABLET EVERY DAY  Indication: Gastroesophageal Reflux Disease   ProAir HFA 108 (90 Base) MCG/ACT inhaler Generic drug: albuterol Inhale 2 puffs into the lungs every 6 (six) hours as needed for wheezing or shortness of breath.  Indication: Asthma       Follow-up Information    Care, [place] on 02/14/2020.   Why: Appointment is scheduled for 02/14/20 at 9:20AM.  You're welcome to call and see if there have been any cancellations for an earlier appointment.  Thanks! Contact information: [place] [address] [phone]               Follow-up recommendations:  Activity:  as tolerated Diet:  regular diet  Comments:   30-day scripts with 1-refill sent to [facility]. Current medication list discussed with patient's mother on 01/18/20 via telephone.   Signed: Salley Scarlet, MD 01/19/2020, 9:47 AM

## 2020-01-19 NOTE — Progress Notes (Signed)
Patient pleasant and cooperative, flat on approach but brightens. Denies SI, HI, AVH. Request prn for anxiety with good relief. Sleeping well, in no distress. Pt remains safe on unit with q 15 min checks.

## 2020-01-19 NOTE — Progress Notes (Signed)
Recreation Therapy Notes  INPATIENT RECREATION TR PLAN  Patient Details Name: Becky Hubbard MRN: [id number] DOB: 1970/04/28 Today's Date: 01/19/2020  Rec Therapy Plan Is patient appropriate for Therapeutic Recreation?: Yes Treatment times per week: at least 3 Estimated Length of Stay: 5-7 days TR Treatment/Interventions: Group participation (Comment)  Discharge Criteria Pt will be discharged from therapy if:: Discharged Treatment plan/goals/alternatives discussed and agreed upon by:: Patient/family  Discharge Summary Short term goals set: Patient will engage in groups without prompting or encouragement from LRT x3 group sessions within 5 recreation therapy group sessions Short term goals met: Not met Reason goals not met: Patient did not attend any groups Therapeutic equipment acquired: N/A Reason patient discharged from therapy: Discharge from hospital Pt/family agrees with progress & goals achieved: Yes Date patient discharged from therapy: 01/19/20   [name] 01/19/2020, 2:45 PM

## 2020-01-19 NOTE — Progress Notes (Signed)
Pt discharged at 13:45, left with her mother, her ride. Pt and pt's mother were given discharge instructions and verbalized understanding which includes the pt's follow up appointments, discharge medications, and discharge medication education. All personal belongings returned to pt upon discharge. No distress noted, none reported, pt denies suicidal and homicidal ideation, denies hallucinations, denies feelings of depression and anxiety.

## 2020-01-19 NOTE — Progress Notes (Signed)
  [facility] Adult Case Management Discharge Plan :  Will you be returning to the same living situation after discharge:  Yes,  pt plans to return home. At discharge, do you have transportation home?: Yes,  mother to provide transportation.  Do you have the ability to pay for your medications: Yes,  [place] Medicaid Prepaid Health Plan/[place] Medicaid Healthy Blue.  Release of information consent forms completed and in the chart;  Patient's signature needed at discharge.  Patient to Follow up at:  Follow-up Information    [facility], [place] on 02/14/2020.   Why: Appointment is scheduled for 02/14/20 at 9:20AM.  You're welcome to call and see if there have been any cancellations for an earlier appointment.  Thanks! Contact information: [place] [address] [phone]               Next level of care provider has access to [place] and Suicide Prevention discussed: Yes,  SPE completed with [name], mother.  Have you used any form of tobacco in the last 30 days? (Cigarettes, Smokeless Tobacco, Cigars, and/or Pipes): No  Has patient been referred to the Quitline?: N/A patient is not a smoker  Patient has been referred for addiction treatment: [place], LCSW 01/19/2020, 9:50 AM

## 2020-01-19 NOTE — BHH Suicide Risk Assessment (Signed)
[facility] Discharge Suicide Risk Assessment   Principal Problem: Major depressive disorder, recurrent episode, severe, with psychosis ([place]) Discharge Diagnoses: Principal Problem:   Major depressive disorder, recurrent episode, severe, with psychosis ([place]) Active Problems:   Fibromyalgia syndrome   Auditory hallucinations   PTSD (post-traumatic stress disorder)   ADD (attention deficit disorder)   Borderline diabetes   Total Time spent with patient: 30 minutes  Musculoskeletal: Strength & Muscle Tone: within normal limits Gait & Station: normal Patient leans: N/A  Psychiatric Specialty Exam: Review of Systems  Constitutional: Negative for activity change and fatigue.  HENT: Negative for rhinorrhea and sore throat.   Eyes: Negative for photophobia and visual disturbance.  Respiratory: Negative for cough and shortness of breath.   Cardiovascular: Negative for chest pain and palpitations.  Gastrointestinal: Negative for constipation, diarrhea, nausea and vomiting.  Endocrine: Negative for cold intolerance and heat intolerance.  Genitourinary: Negative for difficulty urinating and dysuria.  Musculoskeletal: Negative for arthralgias and myalgias.  Skin: Negative for rash and wound.  Allergic/Immunologic: Negative for food allergies and immunocompromised state.  Neurological: Negative for dizziness and headaches.  Hematological: Negative for adenopathy. Does not bruise/bleed easily.  Psychiatric/Behavioral: Negative for agitation, confusion, hallucinations and suicidal ideas.    Blood pressure 105/70, pulse 92, temperature 97.8 F (36.6 C), temperature source Oral, resp. rate 18, height 5\' 5"  (1.651 m), weight 79.8 kg, SpO2 100 %.Body mass index is 29.29 kg/m.  General Appearance: Fairly Groomed  Engineer, water::  Good  Speech:  Clear and Coherent  Volume:  Normal  Mood:  Euthymic  Affect:  Congruent  Thought Process:  Coherent  Orientation:  Full (Time, Place, and Person)  Thought  Content:  Logical  Suicidal Thoughts:  No  Homicidal Thoughts:  No  Memory:  Immediate;   Fair Recent;   Fair Remote;   Fair  Judgement:  Intact  Insight:  Shallow  Psychomotor Activity:  Normal  Concentration:  Fair  Recall:  [employer] of Knowledge:Fair  Language: Fair  Akathisia:  Negative  Handed:  Right  AIMS (if indicated):     Assets:  Communication Skills Desire for Improvement Financial Resources/Insurance Housing Physical Health Resilience Social Support  Sleep:  Number of Hours: 7.75  Cognition: WNL  ADL's:  Intact   Mental Status Per Nursing Assessment::   On Admission:  NA  Demographic Factors:  Caucasian  Loss Factors: NA  Historical Factors: NA  Risk Reduction Factors:   Sense of responsibility to family, Religious beliefs about death, Living with another person, especially a relative, Positive social support, Positive therapeutic relationship and Positive coping skills or problem solving skills  Continued Clinical Symptoms:  Depression:   Recent sense of peace/wellbeing Previous Psychiatric Diagnoses and Treatments Medical Diagnoses and Treatments/Surgeries  Cognitive Features That Contribute To Risk:  None    Suicide Risk:  Minimal: No identifiable suicidal ideation.  Patients presenting with no risk factors but with morbid ruminations; may be classified as minimal risk based on the severity of the depressive symptoms   Follow-up Information    Care, [place] on 02/14/2020.   Why: Appointment is scheduled for 02/14/20 at 9:20AM.  You're welcome to call and see if there have been any cancellations for an earlier appointment.  Thanks! Contact information: [place] [address] [phone]               Plan Of Care/Follow-up recommendations:  Activity:  as tolerated Diet:  regular diet  Salley Scarlet, MD  01/19/2020, 9:32 AM

## 2020-02-28 ENCOUNTER — Encounter: Payer: Self-pay | Admitting: Emergency Medicine

## 2020-02-28 ENCOUNTER — Emergency Department: Payer: Medicaid Other

## 2020-02-28 ENCOUNTER — Other Ambulatory Visit: Payer: Self-pay

## 2020-02-28 DIAGNOSIS — R059 Cough, unspecified: Secondary | ICD-10-CM | POA: Insufficient documentation

## 2020-02-28 DIAGNOSIS — F419 Anxiety disorder, unspecified: Secondary | ICD-10-CM | POA: Insufficient documentation

## 2020-02-28 DIAGNOSIS — R0602 Shortness of breath: Secondary | ICD-10-CM | POA: Insufficient documentation

## 2020-02-28 DIAGNOSIS — R251 Tremor, unspecified: Secondary | ICD-10-CM | POA: Insufficient documentation

## 2020-02-28 DIAGNOSIS — Z5321 Procedure and treatment not carried out due to patient leaving prior to being seen by health care provider: Secondary | ICD-10-CM | POA: Diagnosis not present

## 2020-02-28 LAB — COMPREHENSIVE METABOLIC PANEL
ALT: 18 U/L (ref 0–44)
AST: 17 U/L (ref 15–41)
Albumin: 4.8 g/dL (ref 3.5–5.0)
Alkaline Phosphatase: 66 U/L (ref 38–126)
Anion gap: 14 (ref 5–15)
BUN: 16 mg/dL (ref 6–20)
CO2: 22 mmol/L (ref 22–32)
Calcium: 9.3 mg/dL (ref 8.9–10.3)
Chloride: 102 mmol/L (ref 98–111)
Creatinine, Ser: 0.85 mg/dL (ref 0.44–1.00)
GFR, Estimated: >60 mL/min (ref >60)
Glucose, Bld: 161 mg/dL — ABNORMAL HIGH (ref 70–99)
Potassium: 3.5 mmol/L (ref 3.5–5.1)
Sodium: 138 mmol/L (ref 135–145)
Total Bilirubin: 0.6 mg/dL (ref 0.3–1.2)
Total Protein: 7.8 g/dL (ref 6.5–8.1)

## 2020-02-28 LAB — CBC
HCT: 44.1 % (ref 36.0–46.0)
Hemoglobin: 14.7 g/dL (ref 12.0–15.0)
MCH: 29.9 pg (ref 26.0–34.0)
MCHC: 33.3 g/dL (ref 30.0–36.0)
MCV: 89.6 fL (ref 80.0–100.0)
Platelets: 378 10*3/uL (ref 150–400)
RBC: 4.92 MIL/uL (ref 3.87–5.11)
RDW: 13.4 % (ref 11.5–15.5)
WBC: 13.7 10*3/uL — ABNORMAL HIGH (ref 4.0–10.5)
nRBC: 0 % (ref 0.0–0.2)

## 2020-02-28 LAB — TROPONIN I (HIGH SENSITIVITY): Troponin I (High Sensitivity): 4 ng/L (ref <18)

## 2020-02-28 NOTE — ED Triage Notes (Signed)
EMS brings pt in from home for altered mental status; hx mental health issues, "shaking and dyskinesia" but not on any meds for such; pt denies c/o

## 2020-02-28 NOTE — ED Triage Notes (Signed)
Pt to ED via EMS from home c/o SOB since yesterday, has a cough but states not worse than her normal smokers cough.  States she was been shaking for a while but is from anxiety.  Pt alert to self, place, but states year 2021 and then states "I"m sorry I'm kind of out of it".  Pt chest rise even and unlabored, good concentration, ambulatory with steady gait, in NAD at this time.

## 2020-02-29 ENCOUNTER — Emergency Department
Admission: EM | Admit: 2020-02-29 | Discharge: 2020-02-29 | Disposition: A | Discharge status: Home / Self Care | Payer: Medicaid Other | Attending: Emergency Medicine | Admitting: Emergency Medicine

## 2020-02-29 LAB — TROPONIN I (HIGH SENSITIVITY): Troponin I (High Sensitivity): 5 ng/L (ref <18)

## 2020-03-14 ENCOUNTER — Other Ambulatory Visit: Payer: Self-pay | Admitting: Oncology

## 2020-03-14 DIAGNOSIS — R059 Cough, unspecified: Secondary | ICD-10-CM

## 2020-03-14 DIAGNOSIS — R9389 Abnormal findings on diagnostic imaging of other specified body structures: Secondary | ICD-10-CM

## 2020-03-14 NOTE — Progress Notes (Signed)
  Pulmonary Nodule Clinic Telephone Note [place]   Received referral from [facility] emergency room.  HPI: [name] is a 50 year old female with past medical history significant for COPD, type 2 diabetes, PTSD, ADD, tobacco abuse, OCD, depression and most recently was admitted to [facility] for psychosis. While hospitalized patient had chest x-ray for shortness of breath, cough and history of smoking which found a pericentimeter nodularity within the right middle lobe. Recommend CT chest for further explanation.  Review and Recommendations: I personally reviewed all patient's previous imaging; chest x-ray from 02/28/2020  I recommend follow-up with noncontrast chest CT ASAP.  Social History:  Tobacco Use: High Risk  . Smoking Tobacco Use: Current Every Day Smoker  . Smokeless Tobacco Use: Never Used    High risk factors include: History of heavy smoking, exposure to asbestos, radium or uranium, personal family history of lung cancer, older age, sex (females greater than males), race (black and native [place] greater than weight), marginal speculation, upper lobe location, multiplicity (less than 5 nodules increases risk for malignancy) and emphysema and/or pulmonary fibrosis.   This recommendation follows the consensus statement: Guidelines for Management of Incidental Pulmonary Nodules Detected on CT Images: From the Fleischner Society 2017; Radiology 2017; 284:228-243.    I have placed order for CT scan without contrast to be completed ASAP.   Disposition: Order placed for repeat CT chest. Will notify Len[place] P[place] in scheduling. [place] to call patient with appointment date and time. Return to pulmonary nodule clinic a few days after his repeat imaging to discuss results and plan moving forward.  Faythe Casa, NP 03/14/2020 2:48 PM

## 2020-03-15 ENCOUNTER — Telehealth: Payer: Self-pay | Admitting: *Deleted

## 2020-03-15 NOTE — Telephone Encounter (Signed)
Spoke with pt's mother regarding new referral to the lung nodule clinic. Reviewed upcoming appts. Contact info given and instructed to call with any questions or needs. Pt's mother verbalized understanding.

## 2020-03-25 ENCOUNTER — Other Ambulatory Visit: Payer: Self-pay

## 2020-03-25 ENCOUNTER — Ambulatory Visit
Admission: RE | Admit: 2020-03-25 | Discharge: 2020-03-25 | Disposition: A | Discharge status: Home / Self Care | Payer: Medicaid Other | Source: Ambulatory Visit | Attending: Oncology | Admitting: Oncology

## 2020-03-25 DIAGNOSIS — R059 Cough, unspecified: Secondary | ICD-10-CM | POA: Insufficient documentation

## 2020-03-27 ENCOUNTER — Inpatient Hospital Stay: Payer: Medicaid Other | Attending: Oncology | Admitting: Oncology

## 2020-04-03 ENCOUNTER — Telehealth: Payer: Self-pay | Admitting: *Deleted

## 2020-04-03 NOTE — Telephone Encounter (Signed)
Spoke with pt's mother, Becky Hubbard, and she stated that CT scan results have been reviewed with them by PCP and pt has been referred to Dr. Patsey Berthold for further follow up. At this time, they are not interested in follow up in the lung nodule clinic since has scheduled appt with Dr. Patsey Berthold at the end of the month. Informed Becky Hubbard that can call if has any future questions or needs. Becky Hubbard verbalized understanding.

## 2020-04-04 NOTE — Telephone Encounter (Signed)
TY

## 2020-04-25 ENCOUNTER — Other Ambulatory Visit: Payer: Self-pay

## 2020-04-25 ENCOUNTER — Ambulatory Visit (INDEPENDENT_AMBULATORY_CARE_PROVIDER_SITE_OTHER): Payer: Medicaid Other | Admitting: Adult Health

## 2020-04-25 ENCOUNTER — Encounter: Payer: Self-pay | Admitting: Adult Health

## 2020-04-25 VITALS — BP 102/76 | HR 100 | Temp 99.0°F | Resp 16 | Ht 66.0 in | Wt 171.0 lb

## 2020-04-25 DIAGNOSIS — E559 Vitamin D deficiency, unspecified: Secondary | ICD-10-CM | POA: Diagnosis not present

## 2020-04-25 DIAGNOSIS — Z1231 Encounter for screening mammogram for malignant neoplasm of breast: Secondary | ICD-10-CM | POA: Diagnosis not present

## 2020-04-25 DIAGNOSIS — E119 Type 2 diabetes mellitus without complications: Secondary | ICD-10-CM

## 2020-04-25 DIAGNOSIS — Z1211 Encounter for screening for malignant neoplasm of colon: Secondary | ICD-10-CM

## 2020-04-25 DIAGNOSIS — E538 Deficiency of other specified B group vitamins: Secondary | ICD-10-CM | POA: Diagnosis not present

## 2020-04-25 NOTE — Patient Instructions (Signed)
Call to schedule your screening mammogram. Your orders have been placed for your exam.  Let our office know if you have questions, concerns, or any difficulty scheduling.  If normal results then yearly screening mammograms are recommended unless you notice  Changes in your breast then you should schedule a follow up office visit. If abnormal results  Further imaging will be warranted and sooner follow up as determined by the radiologist at the [facility].   [facility] [address] [place], [place] [address]  Main: [phone]     Health Maintenance, Female Adopting a healthy lifestyle and getting preventive care are important in promoting health and wellness. Ask your health care provider about:  The right schedule for you to have regular tests and exams.  Things you can do on your own to prevent diseases and keep yourself healthy. What should I know about diet, weight, and exercise? Eat a healthy diet  Eat a diet that includes plenty of vegetables, fruits, low-fat dairy products, and lean protein.  Do not eat a lot of foods that are high in solid fats, added sugars, or sodium.   Maintain a healthy weight Body mass index (BMI) is used to identify weight problems. It estimates body fat based on height and weight. Your health care provider can help determine your BMI and help you achieve or maintain a healthy weight. Get regular exercise Get regular exercise. This is one of the most important things you can do for your health. Most adults should:  Exercise for at least 150 minutes each week. The exercise should increase your heart rate and make you sweat (moderate-intensity exercise).  Do strengthening exercises at least twice a week. This is in addition to the moderate-intensity exercise.  Spend less time sitting. Even light physical activity can be beneficial. Watch cholesterol and blood lipids Have your blood tested for lipids and  cholesterol at 51 years of age, then have this test every 5 years. Have your cholesterol levels checked more often if:  Your lipid or cholesterol levels are high.  You are older than 51 years of age.  You are at high risk for heart disease. What should I know about cancer screening? Depending on your health history and family history, you may need to have cancer screening at various ages. This may include screening for:  Breast cancer.  Cervical cancer.  Colorectal cancer.  Skin cancer.  Lung cancer. What should I know about heart disease, diabetes, and high blood pressure? Blood pressure and heart disease  High blood pressure causes heart disease and increases the risk of stroke. This is more likely to develop in people who have high blood pressure readings, are of African descent, or are overweight.  Have your blood pressure checked: ? Every 3-5 years if you are 18-39 years of age. ? Every year if you are 40 years old or older. Diabetes Have regular diabetes screenings. This checks your fasting blood sugar level. Have the screening done:  Once every three years after age 40 if you are at a normal weight and have a low risk for diabetes.  More often and at a younger age if you are overweight or have a high risk for diabetes. What should I know about preventing infection? Hepatitis B If you have a higher risk for hepatitis B, you should be screened for this virus. Talk with your health care provider to find out if you are at risk for hepatitis B infection. Hepatitis   C Testing is recommended for:  Everyone born from 1945 through 1965.  Anyone with known risk factors for hepatitis C. Sexually transmitted infections (STIs)  Get screened for STIs, including gonorrhea and chlamydia, if: ? You are sexually active and are younger than 51 years of age. ? You are older than 51 years of age and your health care provider tells you that you are at risk for this type of  infection. ? Your sexual activity has changed since you were last screened, and you are at increased risk for chlamydia or gonorrhea. Ask your health care provider if you are at risk.  Ask your health care provider about whether you are at high risk for HIV. Your health care provider may recommend a prescription medicine to help prevent HIV infection. If you choose to take medicine to prevent HIV, you should first get tested for HIV. You should then be tested every 3 months for as long as you are taking the medicine. Pregnancy  If you are about to stop having your period (premenopausal) and you may become pregnant, seek counseling before you get pregnant.  Take 400 to 800 micrograms (mcg) of folic acid every day if you become pregnant.  Ask for birth control (contraception) if you want to prevent pregnancy. Osteoporosis and menopause Osteoporosis is a disease in which the bones lose minerals and strength with aging. This can result in bone fractures. If you are 65 years old or older, or if you are at risk for osteoporosis and fractures, ask your health care provider if you should:  Be screened for bone loss.  Take a calcium or vitamin D supplement to lower your risk of fractures.  Be given hormone replacement therapy (HRT) to treat symptoms of menopause. Follow these instructions at home: Lifestyle  Do not use any products that contain nicotine or tobacco, such as cigarettes, e-cigarettes, and chewing tobacco. If you need help quitting, ask your health care provider.  Do not use street drugs.  Do not share needles.  Ask your health care provider for help if you need support or information about quitting drugs. Alcohol use  Do not drink alcohol if: ? Your health care provider tells you not to drink. ? You are pregnant, may be pregnant, or are planning to become pregnant.  If you drink alcohol: ? Limit how much you use to 0-1 drink a day. ? Limit intake if you are  breastfeeding.  Be aware of how much alcohol is in your drink. In the [place] one drink equals one 12 oz bottle of beer (355 mL), one 5 oz glass of wine (148 mL), or one 1 oz glass of hard liquor (44 mL). General instructions  Schedule regular health, dental, and eye exams.  Stay current with your vaccines.  Tell your health care provider if: ? You often feel depressed. ? You have ever been abused or do not feel safe at home. Summary  Adopting a healthy lifestyle and getting preventive care are important in promoting health and wellness.  Follow your health care provider's instructions about healthy diet, exercising, and getting tested or screened for diseases.  Follow your health care provider's instructions on monitoring your cholesterol and blood pressure. This information is not intended to replace advice given to you by your health care provider. Make sure you discuss any questions you have with your health care provider. Document Revised: 01/19/2018 Document Reviewed: 01/19/2018 Elsevier Patient Education  2021 [employer]  

## 2020-04-25 NOTE — Progress Notes (Signed)
New patient visit   Patient: Becky Hubbard   DOB: 12/23/1970   50 y.o. Female  MRN: [id number] Visit Date: 04/25/2020  Today's healthcare provider: Marcille Buffy, FNP   Chief Complaint  Patient presents with  . New Patient (Initial Visit)   Subjective    Becky Hubbard is a 50 y.o. female who presents today as a new patient to establish care.  HPI  Patient presents in office today accompanied by her mother to establish care, patient states that she feels well today and has no concerns to address. Patient reports that she follows a general diet, she is not actively exercising and reports that her sleep habits are fair.  She has no concerns today.  Up to date on PAP sees [facility]. PAP was 10/14/2018 and duw 10/2021.  Needs mammogram.   Patient  denies any fever, body aches,chills, rash, chest pain, shortness of breath, nausea, vomiting, or diarrhea.  Denies dizziness, lightheadedness, pre syncopal or syncopal episodes.    Past Medical History:  Diagnosis Date  . ADD (attention deficit disorder)   . Anxiety   . Arthritis   . Auditory hallucinations   . Carpal tunnel syndrome on both sides   . Cervical spine fracture ([place]) 2008   Closed with screw C2-3 [facility] [address]. Delilah Shan  . Degenerative joint disease of spine   . Depression   . Diabetes mellitus without complication ([place])   . Family history of adverse reaction to anesthesia    mom- slow to awaken  . Fibromyalgia   . Foot fracture, left   . Gastroparesis   . Gastroparesis   . GERD (gastroesophageal reflux disease)   . Hearing loss   . Herniated cervical disc   . History of prescription drug abuse   . Neuromuscular disorder (HCC)    fibromyalgia/chronic fatique syndrome  . Neuropathy of foot   . OCD (obsessive compulsive disorder)   . 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 Plaque psoriasis   .  PTSD (post-traumatic stress disorder)   . Stroke ([place])   . Ulcer    Past Surgical History:  Procedure Laterality Date  . CARPAL TUNNEL RELEASE Left 07/12/2015   Procedure: OPEN CARPAL TUNNEL RELEASE OF LEFT HAND;  Surgeon: Leanor Kail, MD;  Location: [place];  Service: Orthopedics;  Laterality: Left;  . cervical radiofrequency neurotomy    . FRACTURE SURGERY    . MOUTH SURGERY     upper and lower dentures  . NASAL SEPTUM SURGERY    . occipital nerve blocks    . SPINE SURGERY     cervical   Family Status  Relation Name Status  . Mother  Alive  . Father  Deceased  . Brother [employer]  . MGF  Deceased  . PGF  Deceased  . Mat Aunt  Deceased  . MGM  Deceased  . PGM  Deceased  . Brother [employer]  . Sister Toni Arthurs  . Brother [employer]  . Ethlyn Daniels  (Not Specified)   Family History  Problem Relation Age of Onset  . Cancer Mother 36       breast cancer  . Migraines Mother   . Hypertension Mother   . Arthritis Mother   . Sleep apnea Mother   . Anxiety disorder Mother   . Depression Mother   .  Diabetes Father   . Cancer Father 19       of spine , pancreas and liver  . Anxiety disorder Father   . Depression Father   . Diabetes Brother   . Hyperlipidemia Brother   . Sleep apnea Brother   . Cancer Maternal Grandfather        leukemia, prostate  . Cancer Paternal Grandfather        bone  . Cancer Maternal Aunt        breast  . Mental illness Maternal Grandmother        Dementia  . Stroke Paternal Grandmother   . Diabetes Brother   . Cancer Paternal Aunt        breast   Social History   Socioeconomic History  . Marital status: Divorced    Spouse name: Not on file  . Number of children: Not on file  . Years of education: Not on file  . Highest education level: Not on file  Occupational History  . Not on file  Tobacco Use  . Smoking status: Current Every Day Smoker    Packs/day: 1.00    Years: 20.00    Pack years: 20.00    Types:  Cigarettes    Start date: 03/01/2011  . Smokeless tobacco: Never Used  Vaping Use  . Vaping Use: Never used  Substance and Sexual Activity  . Alcohol use: No  . Drug use: No  . Sexual activity: Yes    Birth control/protection: None  Other Topics Concern  . Not on file  Social History Narrative   Depression- history of suicide attempt and hospitalization about 1998 after divorce   H/O hospitalization at [facility] for narcotics detox/withdrawal approx. 2000   Social Determinants of Health   Financial Resource Strain: Not on file  Food Insecurity: Not on file  Transportation Needs: Not on file  Physical Activity: Not on file  Stress: Not on file  Social Connections: Not on file   Outpatient Medications Prior to Visit  Medication Sig  . buPROPion (WELLBUTRIN XL) 150 MG 24 hr tablet Take 150 mg by mouth every morning.  . DULoxetine (CYMBALTA) 60 MG capsule Take 1 capsule (60 mg total) by mouth daily.  Marland Kitchen EPINEPHrine 0.3 mg/0.3 mL IJ SOAJ injection Inject 0.3 mg into the muscle as needed for anaphylaxis.  . haloperidol (HALDOL) 5 MG tablet Take 1 tablet (5 mg total) by mouth 2 (two) times daily.  . hydrOXYzine (ATARAX/VISTARIL) 50 MG tablet TAKE ONE TABLET BY MOUTH 3 TIMES DAILY AS NEEDED (DOSAGE INCREASE)  . metFORMIN (GLUCOPHAGE) 500 MG tablet Take 1 tablet (500 mg total) by mouth daily with breakfast.  . ziprasidone (GEODON) 60 MG capsule TAKE 1 CAPSULE BY MOUTH EVERY EVENING AFTER MEAL  . aspirin EC 81 MG EC tablet Take 1 tablet (81 mg total) by mouth daily. Swallow whole. (Patient not taking: Reported on 04/25/2020)  . fluticasone (FLONASE) 50 MCG/ACT nasal spray Place 2 sprays into both nostrils daily.  (Patient not taking: Reported on 04/25/2020)  . lisdexamfetamine (VYVANSE) 40 MG capsule Take 1 capsule (40 mg total) by mouth daily. (Patient not taking: Reported on 04/25/2020)  . pantoprazole (PROTONIX) 40 MG tablet TAKE ONE TABLET EVERY DAY (Patient not taking: Reported  on 04/25/2020)  . PROAIR HFA 108 (90 Base) MCG/ACT inhaler Inhale 2 puffs into the lungs every 6 (six) hours as needed for wheezing or shortness of breath.  (Patient not taking: Reported on 04/25/2020)  . [DISCONTINUED] etonogestrel (  NEXPLANON) 68 MG IMPL implant 1 each (68 mg total) by Subdermal route once for 1 dose.  . [DISCONTINUED] hydrOXYzine (ATARAX/VISTARIL) 25 MG tablet Take 1 tablet (25 mg total) by mouth 3 (three) times daily as needed for anxiety.   No facility-administered medications prior to visit.   Allergies  Allergen Reactions  . Doxycycline   . Lyrica [Pregabalin] Swelling    Ankles swell and turn red  . Neurontin [Gabapentin]   . Chantix [Varenicline] Rash  . Prednisone Other (See Comments)  . Other Other (See Comments)    Cats - cough/sneezing  . Penicillin G Hives  . Penicillins Hives    Has patient had a PCN reaction causing immediate rash, facial/tongue/throat swelling, SOB or lightheadedness with hypotension: No Has patient had a PCN reaction causing severe rash involving mucus membranes or skin necrosis: No Has patient had a PCN reaction that required hospitalization No Has patient had a PCN reaction occurring within the last 10 years: No If all of the above answers are "NO", then may proceed with Cephalosporin use.   . Sulfa Antibiotics Hives, Rash and Other (See Comments)    Immunization History  Administered Date(s) Administered  . Influenza-Unspecified 04/09/2014, 11/04/2015  . Pneumococcal Polysaccharide-23 12/19/2008  . Tdap 07/31/2014    Health Maintenance  Topic Date Due  . COVID-19 Vaccine (1) Never done  . FOOT EXAM  Never done  . COLONOSCOPY (Pts 45-38yr Insurance coverage will need to be confirmed)  Never done  . URINE MICROALBUMIN  07/03/2016  . MAMMOGRAM  Never done  . INFLUENZA VACCINE  06/11/2020 (Originally 09/10/2019)  . OPHTHALMOLOGY EXAM  10/02/2020 (Originally 05/14/1979)  . HEMOGLOBIN A1C  10/26/2020  . PAP SMEAR-Modifier   10/14/2023  . TETANUS/TDAP  07/30/2024  . PNEUMOCOCCAL POLYSACCHARIDE VACCINE AGE 19-64 HIGH RISK  Completed  . Hepatitis C Screening  Completed  . HIV Screening  Completed  . HPV VACCINES  Aged Out    Patient Care Team: Irene Mitcham, MKelby Aline FNP as PCP - General (Family Medicine) CWerner Lean MD as PCP - Cardiology (Cardiology)  Review of Systems  Constitutional: Positive for fatigue.  Respiratory: Negative for apnea, cough, choking, chest tightness, shortness of breath, wheezing and stridor.   Cardiovascular: Negative.   Gastrointestinal: Positive for abdominal distention.  Musculoskeletal: Positive for arthralgias and back pain.  Hematological: Negative for adenopathy. Does not bruise/bleed easily.  Psychiatric/Behavioral: Positive for agitation and hallucinations. The patient is nervous/anxious.   All other systems reviewed and are negative.   Last thyroid functions Lab Results  Component Value Date   TSH 2.450 04/25/2020      Objective    BP 102/76   Pulse 100   Temp 99 F (37.2 C) (Oral)   Resp 16   Ht 5' 6"  (1.676 m)   Wt 171 lb (77.6 kg)   SpO2 99%   BMI 27.60 kg/m  Physical Exam Vitals reviewed.  Constitutional:      General: She is not in acute distress.    Appearance: She is well-developed. She is not diaphoretic.     Interventions: She is not intubated. HENT:     Head: Normocephalic and atraumatic.     Right Ear: External ear normal.     Left Ear: External ear normal.     Nose: Nose normal.     Mouth/Throat:     Pharynx: No oropharyngeal exudate.  Eyes:     General: Lids are normal. No scleral icterus.       Right  eye: No discharge.        Left eye: No discharge.     Conjunctiva/sclera: Conjunctivae normal.     Right eye: Right conjunctiva is not injected. No exudate or hemorrhage.    Left eye: Left conjunctiva is not injected. No exudate or hemorrhage.    Pupils: Pupils are equal, round, and reactive to light.  Neck:     Thyroid:  No thyroid mass or thyromegaly.     Vascular: Normal carotid pulses. No carotid bruit, hepatojugular reflux or JVD.     Trachea: Trachea and phonation normal. No tracheal tenderness or tracheal deviation.     Meningeal: Brudzinski's sign and Kernig's sign absent.  Cardiovascular:     Rate and Rhythm: Normal rate and regular rhythm.     Pulses: Normal pulses.          Radial pulses are 2+ on the right side and 2+ on the left side.       Dorsalis pedis pulses are 2+ on the right side and 2+ on the left side.       Posterior tibial pulses are 2+ on the right side and 2+ on the left side.     Heart sounds: Normal heart sounds, S1 normal and S2 normal. Heart sounds not distant. No murmur heard. No friction rub. No gallop.   Pulmonary:     Effort: Pulmonary effort is normal. No tachypnea, bradypnea, accessory muscle usage or respiratory distress. She is not intubated.     Breath sounds: Normal breath sounds. No stridor. No wheezing or rales.  Chest:     Chest wall: No tenderness.  Breasts:     Right: No supraclavicular adenopathy.     Left: No supraclavicular adenopathy.    Abdominal:     General: Bowel sounds are normal. There is no distension or abdominal bruit.     Palpations: Abdomen is soft. There is no shifting dullness, fluid wave, hepatomegaly, splenomegaly, mass or pulsatile mass.     Tenderness: There is no abdominal tenderness. There is no guarding or rebound.     Hernia: No hernia is present.  Musculoskeletal:        General: No tenderness or deformity. Normal range of motion.     Cervical back: Full passive range of motion without pain, normal range of motion and neck supple. No edema, erythema or rigidity. No spinous process tenderness or muscular tenderness. Normal range of motion.  Lymphadenopathy:     Head:     Right side of head: No submental, submandibular, tonsillar, preauricular, posterior auricular or occipital adenopathy.     Left side of head: No submental,  submandibular, tonsillar, preauricular, posterior auricular or occipital adenopathy.     Cervical: No cervical adenopathy.     Right cervical: No superficial, deep or posterior cervical adenopathy.    Left cervical: No superficial, deep or posterior cervical adenopathy.     Upper Body:     Right upper body: No supraclavicular or pectoral adenopathy.     Left upper body: No supraclavicular or pectoral adenopathy.  Skin:    General: Skin is warm and dry.     Coloration: Skin is not pale.     Findings: No abrasion, bruising, burn, ecchymosis, erythema, lesion, petechiae or rash.     Nails: There is no clubbing.  Neurological:     Mental Status: She is alert and oriented to person, place, and time.     GCS: GCS eye subscore is 4. GCS verbal subscore is 5.  GCS motor subscore is 6.     Cranial Nerves: No cranial nerve deficit.     Sensory: No sensory deficit.     Motor: No tremor, atrophy, abnormal muscle tone or seizure activity.     Coordination: Coordination normal.     Gait: Gait normal.     Deep Tendon Reflexes: Reflexes are normal and symmetric. Reflexes normal. Babinski sign absent on the right side. Babinski sign absent on the left side.     Reflex Scores:      Tricep reflexes are 2+ on the right side and 2+ on the left side.      Bicep reflexes are 2+ on the right side and 2+ on the left side.      Brachioradialis reflexes are 2+ on the right side and 2+ on the left side.      Patellar reflexes are 2+ on the right side and 2+ on the left side.      Achilles reflexes are 2+ on the right side and 2+ on the left side. Psychiatric:        Speech: Speech normal.        Behavior: Behavior normal.        Thought Content: Thought content normal.        Judgment: Judgment normal.     Depression Screen PHQ 2/9 Scores 04/25/2020 02/06/2016 10/04/2015  PHQ - 2 Score 1 0 3  PHQ- 9 Score 3 4 17    Results for orders placed or performed in visit on 04/25/20  CBC with Differential/Platelet   Result Value Ref Range   WBC 8.3 3.4 - 10.8 x10E3/uL   RBC 5.05 3.77 - 5.28 x10E6/uL   Hemoglobin 15.0 11.1 - 15.9 g/dL   Hematocrit 45.0 34.0 - 46.6 %   MCV 89 79 - 97 fL   MCH 29.7 26.6 - 33.0 pg   MCHC 33.3 31.5 - 35.7 g/dL   RDW 12.1 11.7 - 15.4 %   Platelets 294 150 - 450 x10E3/uL   Neutrophils 57 Not Estab. %   Lymphs 27 Not Estab. %   Monocytes 9 Not Estab. %   Eos 5 Not Estab. %   Basos 1 Not Estab. %   Neutrophils Absolute 4.8 1.4 - 7.0 x10E3/uL   Lymphocytes Absolute 2.2 0.7 - 3.1 x10E3/uL   Monocytes Absolute 0.7 0.1 - 0.9 x10E3/uL   EOS (ABSOLUTE) 0.4 0.0 - 0.4 x10E3/uL   Basophils Absolute 0.1 0.0 - 0.2 x10E3/uL   Immature Granulocytes 1 Not Estab. %   Immature Grans (Abs) 0.0 0.0 - 0.1 x10E3/uL  Comprehensive metabolic panel  Result Value Ref Range   Glucose 99 65 - 99 mg/dL   BUN 11 6 - 24 mg/dL   Creatinine, Ser 0.91 0.57 - 1.00 mg/dL   eGFR 77 >59 mL/min/1.73   BUN/Creatinine Ratio 12 9 - 23   Sodium 142 134 - 144 mmol/L   Potassium 4.2 3.5 - 5.2 mmol/L   Chloride 102 96 - 106 mmol/L   CO2 21 20 - 29 mmol/L   Calcium 9.8 8.7 - 10.2 mg/dL   Total Protein 6.9 6.0 - 8.5 g/dL   Albumin 4.7 3.8 - 4.8 g/dL   Globulin, Total 2.2 1.5 - 4.5 g/dL   Albumin/Globulin Ratio 2.1 1.2 - 2.2   Bilirubin Total 0.4 0.0 - 1.2 mg/dL   Alkaline Phosphatase 67 44 - 121 IU/L   AST 16 0 - 40 IU/L   ALT 25 0 - 32 IU/L  Lipid  panel  Result Value Ref Range   Cholesterol, Total 193 100 - 199 mg/dL   Triglycerides 175 (H) 0 - 149 mg/dL   HDL 34 (L) >39 mg/dL   VLDL Cholesterol Cal 31 5 - 40 mg/dL   LDL Chol Calc (NIH) 128 (H) 0 - 99 mg/dL   Chol/HDL Ratio 5.7 (H) 0.0 - 4.4 ratio  TSH  Result Value Ref Range   TSH 2.450 0.450 - 4.500 uIU/mL  B12  Result Value Ref Range   Vitamin B-12 546 232 - 1,245 pg/mL  VITAMIN D 25 Hydroxy (Vit-D Deficiency, Fractures)  Result Value Ref Range   Vit D, 25-Hydroxy 49.9 30.0 - 100.0 ng/mL  HgB A1c  Result Value Ref Range   Hgb A1c  MFr Bld 5.8 (H) 4.8 - 5.6 %   Est. average glucose Bld gHb Est-mCnc 120 mg/dL    Assessment & Plan     Type 2 diabetes mellitus without complication, without long-term current use of insulin (HCC) - Plan: CBC with Differential/Platelet, Comprehensive metabolic panel, Lipid panel, TSH, HgB A1c  Screening mammogram, encounter for - Plan: MM Digital Screening  Vitamin D deficiency - Plan: VITAMIN D 25 Hydroxy (Vit-D Deficiency, Fractures)  B12 deficiency - Plan: B12  Colon cancer screening - Plan: Cologuard  Orders Placed This Encounter  Procedures  . MM Digital Screening  . CBC with Differential/Platelet  . Comprehensive metabolic panel  . Lipid panel  . TSH  . B12  . VITAMIN D 25 Hydroxy (Vit-D Deficiency, Fractures)  . HgB A1c  . Cologuard   No orders of the defined types were placed in this encounter.  Red Flags discussed. The patient was given clear instructions to go to ER or return to medical center if any red flags develop, symptoms do not improve, worsen or new problems develop. They verbalized understanding.   Return in about 4 months (around 08/25/2020), or if symptoms worsen or fail to improve, for Go to Emergency room/ urgent care if worse, at any time for any worsening symptoms.      The entirety of the information documented in the History of Present Illness, Review of Systems and Physical Exam were personally obtained by me. Portions of this information were initially documented by the CMA and reviewed by me for thoroughness and accuracy.     Marcille Buffy, [place] [phone] (phone) [phone] (fax)  [place]

## 2020-04-26 ENCOUNTER — Telehealth: Payer: Self-pay

## 2020-04-26 ENCOUNTER — Other Ambulatory Visit: Payer: Self-pay | Admitting: Adult Health

## 2020-04-26 DIAGNOSIS — E559 Vitamin D deficiency, unspecified: Secondary | ICD-10-CM

## 2020-04-26 LAB — CBC WITH DIFFERENTIAL/PLATELET
Basophils Absolute: 0.1 10*3/uL (ref 0.0–0.2)
Basos: 1 %
EOS (ABSOLUTE): 0.4 10*3/uL (ref 0.0–0.4)
Eos: 5 %
Hematocrit: 45 % (ref 34.0–46.6)
Hemoglobin: 15 g/dL (ref 11.1–15.9)
Immature Grans (Abs): 0 10*3/uL (ref 0.0–0.1)
Immature Granulocytes: 1 %
Lymphocytes Absolute: 2.2 10*3/uL (ref 0.7–3.1)
Lymphs: 27 %
MCH: 29.7 pg (ref 26.6–33.0)
MCHC: 33.3 g/dL (ref 31.5–35.7)
MCV: 89 fL (ref 79–97)
Monocytes Absolute: 0.7 10*3/uL (ref 0.1–0.9)
Monocytes: 9 %
Neutrophils Absolute: 4.8 10*3/uL (ref 1.4–7.0)
Neutrophils: 57 %
Platelets: 294 10*3/uL (ref 150–450)
RBC: 5.05 x10E6/uL (ref 3.77–5.28)
RDW: 12.1 % (ref 11.7–15.4)
WBC: 8.3 10*3/uL (ref 3.4–10.8)

## 2020-04-26 LAB — COMPREHENSIVE METABOLIC PANEL
ALT: 25 IU/L (ref 0–32)
AST: 16 IU/L (ref 0–40)
Albumin/Globulin Ratio: 2.1 (ref 1.2–2.2)
Albumin: 4.7 g/dL (ref 3.8–4.8)
Alkaline Phosphatase: 67 IU/L (ref 44–121)
BUN/Creatinine Ratio: 12 (ref 9–23)
BUN: 11 mg/dL (ref 6–24)
Bilirubin Total: 0.4 mg/dL (ref 0.0–1.2)
CO2: 21 mmol/L (ref 20–29)
Calcium: 9.8 mg/dL (ref 8.7–10.2)
Chloride: 102 mmol/L (ref 96–106)
Creatinine, Ser: 0.91 mg/dL (ref 0.57–1.00)
Globulin, Total: 2.2 g/dL (ref 1.5–4.5)
Glucose: 99 mg/dL (ref 65–99)
Potassium: 4.2 mmol/L (ref 3.5–5.2)
Sodium: 142 mmol/L (ref 134–144)
Total Protein: 6.9 g/dL (ref 6.0–8.5)
eGFR: 77 mL/min/{1.73_m2} (ref >59)

## 2020-04-26 LAB — HEMOGLOBIN A1C
Est. average glucose Bld gHb Est-mCnc: 120 mg/dL
Hgb A1c MFr Bld: 5.8 % — ABNORMAL HIGH (ref 4.8–5.6)

## 2020-04-26 LAB — LIPID PANEL
Chol/HDL Ratio: 5.7 ratio — ABNORMAL HIGH (ref 0.0–4.4)
Cholesterol, Total: 193 mg/dL (ref 100–199)
HDL: 34 mg/dL — ABNORMAL LOW (ref >39)
LDL Chol Calc (NIH): 128 mg/dL — ABNORMAL HIGH (ref 0–99)
Triglycerides: 175 mg/dL — ABNORMAL HIGH (ref 0–149)
VLDL Cholesterol Cal: 31 mg/dL (ref 5–40)

## 2020-04-26 LAB — VITAMIN B12: Vitamin B-12: 546 pg/mL (ref 232–1245)

## 2020-04-26 LAB — VITAMIN D 25 HYDROXY (VIT D DEFICIENCY, FRACTURES): Vit D, 25-Hydroxy: 49.9 ng/mL (ref 30.0–100.0)

## 2020-04-26 LAB — TSH: TSH: 2.45 u[IU]/mL (ref 0.450–4.500)

## 2020-04-26 MED ORDER — VITAMIN D (ERGOCALCIFEROL) 1.25 MG (50000 UNIT) PO CAPS: 50000.0000 [IU] | ORAL_CAPSULE | ORAL | 0 refills | Status: DC

## 2020-04-26 NOTE — Telephone Encounter (Signed)
-----   Message from Doreen Beam, [place] sent at 04/26/2020  7:00 AM EDT ----- WBC is within normal limits. CMP within normal limits.  Total cholesterol, triglycerides, and LDL elevated. Discuss lifestyle modification with patient e.g. increase exercise, fiber, fruits, vegetables, lean meat, and omega 3/fish intake and decrease saturated fat. If patient following strict diet and exercise program already please schedule follow up appointment with primary care physician  TSH for thyroid is within normal limits. B12 is within normal limits.  Hemoglobin A1C is prediabetic range advise dietary and lifestyle changes to prevent diabetes.   Vitamin D is within normal but if she would like she can do prescription for 12 weeks and not take over the counter. If she prefers over the counter vitamin D she can take Vitamin D 3  at 2,000 to 4,000 IU ( international units) by mouth once daily. If she prefers I will send in prescription for Vitamin D at 50,000 units by mouth once every 7 days/(once weekly) for 12 weeks. Advise recheck lab Vitamin D in 1-2 weeks after completing vitamin d prescription. Lab iis walk in and is closed during lunch during regular office hours.recheck lab in 12 weeks vitamin D.

## 2020-04-26 NOTE — Progress Notes (Signed)
WBC is within normal limits. CMP within normal limits.  Total cholesterol, triglycerides, and LDL elevated.  Discuss lifestyle modification with patient e.g. increase exercise, fiber, fruits, vegetables, lean meat, and omega 3/fish intake and decrease saturated fat.  If patient following strict diet and exercise program already please schedule follow up appointment with primary care physician  TSH for thyroid is within normal limits. B12 is within normal limits.  Hemoglobin A1C is prediabetic range advise dietary and lifestyle changes to prevent diabetes.   Vitamin  D is within normal but if she would like she can do prescription for 12 weeks and not take over the counter. will send in prescription for Vitamin D at 50,000 units by mouth once every 7 days/(once weekly) for 12 weeks. Advise recheck lab Vitamin D in 1-2 weeks after completing vitamin d prescription. Lab iis walk in and is closed during lunch during regular office hours.recheck lab in 12 weeks vitamin D.

## 2020-04-26 NOTE — Progress Notes (Signed)
Meds ordered this encounter  Medications  . Vitamin D, Ergocalciferol, (DRISDOL) 1.25 MG (50000 UNIT) CAPS capsule    Sig: Take 1 capsule (50,000 Units total) by mouth every 7 (seven) days. (taking one tablet per week) walk in lab in office 1-2 weeks after completing prescription.    Dispense:  12 capsule    Refill:  0   Orders Placed This Encounter  Procedures  . VITAMIN D 25 Hydroxy (Vit-D Deficiency, Fractures)   

## 2020-04-26 NOTE — Telephone Encounter (Signed)
Patient has been advised and states that she would like to start vitamin D, advised patient [facility] and to work on dietary and lifestyle changes. KW

## 2020-04-26 NOTE — Progress Notes (Signed)
WBC is within normal limits. CMP within normal limits.  Total cholesterol, triglycerides, and LDL elevated. Discuss lifestyle modification with patient e.g. increase exercise, fiber, fruits, vegetables, lean meat, and omega 3/fish intake and decrease saturated fat. If patient following strict diet and exercise program already please schedule follow up appointment with primary care physician  TSH for thyroid is within normal limits. B12 is within normal limits.  Hemoglobin A1C is prediabetic range advise dietary and lifestyle changes to prevent diabetes.   Vitamin D is within normal but if she would like she can do prescription for 12 weeks and not take over the counter. If she prefers over the counter vitamin D she can take Vitamin D 3  at 2,000 to 4,000 IU ( international units) by mouth once daily. If she prefers I will send in prescription for Vitamin D at 50,000 units by mouth once every 7 days/(once weekly) for 12 weeks. Advise recheck lab Vitamin D in 1-2 weeks after completing vitamin d prescription. Lab iis walk in and is closed during lunch during regular office hours.recheck lab in 12 weeks vitamin D.

## 2020-04-29 DIAGNOSIS — E538 Deficiency of other specified B group vitamins: Secondary | ICD-10-CM | POA: Insufficient documentation

## 2020-05-07 ENCOUNTER — Other Ambulatory Visit: Payer: Self-pay

## 2020-05-07 ENCOUNTER — Encounter: Payer: Self-pay | Admitting: Pulmonary Disease

## 2020-05-07 ENCOUNTER — Ambulatory Visit (INDEPENDENT_AMBULATORY_CARE_PROVIDER_SITE_OTHER): Payer: Medicaid Other | Admitting: Pulmonary Disease

## 2020-05-07 VITALS — BP 100/70 | HR 98 | Temp 97.5°F | Ht 66.0 in | Wt 167.7 lb

## 2020-05-07 DIAGNOSIS — R0602 Shortness of breath: Secondary | ICD-10-CM | POA: Diagnosis not present

## 2020-05-07 DIAGNOSIS — J449 Chronic obstructive pulmonary disease, unspecified: Secondary | ICD-10-CM

## 2020-05-07 DIAGNOSIS — R918 Other nonspecific abnormal finding of lung field: Secondary | ICD-10-CM

## 2020-05-07 DIAGNOSIS — F17210 Nicotine dependence, cigarettes, uncomplicated: Secondary | ICD-10-CM | POA: Diagnosis not present

## 2020-05-07 DIAGNOSIS — F17200 Nicotine dependence, unspecified, uncomplicated: Secondary | ICD-10-CM

## 2020-05-07 MED ORDER — TRELEGY ELLIPTA 100-62.5-25 MCG/INH IN AEPB: 1.0000 | INHALATION_SPRAY | Freq: Every day | RESPIRATORY_TRACT | 0 refills | Status: AC

## 2020-05-07 NOTE — Patient Instructions (Addendum)
We are going to get some allergy tests and a breathing test.  You do need to quit smoking.  We are giving you a trial of Trelegy Ellipta 1 inhalation daily this is an inhaler that should prevent the tightness in your chest.  You can still use your albuterol (Ventolin) as needed for shortness of breath or chest tightness.  You should have a yearly CT to her for lung cancer.  This is call lung cancer screening.  We will transition you to that program.  We will see you in follow-up in 4 to 6 weeks time with either me or the nurse practitioner.  Let us know how you do with the Trelegy so we can call the prescription to your pharmacy.

## 2020-05-07 NOTE — Progress Notes (Signed)
Subjective:    Patient ID: Becky Hubbard, female    DOB: [birth year], 50 y.o.   MRN: [id number] Chief Complaint  Patient presents with   Consult    COPD  CT results Uses Pro Air inhaler twice a day.   HPI Is a 50 year old current smoker (2 to 3 PPD) who presents for evaluation of potential COPD.  She is kindly referred by Laverna Peace, [place].  She also states that she had a CT scan of the chest and was concerned about findings.  Patient is a challenging historian due to underlying need for multiple psychiatric medications.  Her affect is flat and she is not very engaging in conversation.  She does note that she has some dyspnea on exertion but this has been of longstanding.  No change in the character of her dyspnea.  She endorses some occasional chest tightness which gets relieved with as needed albuterol.  No cough or sputum production no hemoptysis.  She had a CT scan of the chest on 25 March 2020 which was actually quite benign.  Some very minimal subpleural nodules noted on the left lower lobe that were almost imperceptible.  No mediastinal adenopathy and no major parenchymal abnormality with the exception of some very mild emphysema.  There was a 4 mm nodule triangular in shape abutting the fissure on the left consistent with an intrapleural lymph node.  Patient does not endorse any fevers, chills or sweats.  No weight loss or anorexia.  No other symptomatology.  As noted she is a very challenging historian.  She does not endorse any other symptomatology.  She does note that she lives with her mother and there are cats in the home.  Review of Systems Past Medical History:  Diagnosis Date   ADD (attention deficit disorder)    Anxiety    Arthritis    Auditory hallucinations    Carpal tunnel syndrome on both sides    Cervical spine fracture ([place]) 2008   Closed with screw C2-3 [facility]. Delilah Shan   Degenerative joint disease of spine    Depression    Diabetes mellitus without  complication ([place])    Family history of adverse reaction to anesthesia    mom- slow to awaken   Fibromyalgia    Foot fracture, left    Gastroparesis    Gastroparesis    GERD (gastroesophageal reflux disease)    Hearing loss    Herniated cervical disc    History of prescription drug abuse    Neuromuscular disorder (HCC)    fibromyalgia/chronic fatique syndrome   Neuropathy of foot    OCD (obsessive compulsive disorder)    Other specified disorder of skin    neurological skin disorder-breaks out when nervous   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   NOT seen on exam 04/10/14   Plaque psoriasis    PTSD (post-traumatic stress disorder)    Stroke [facility])    Ulcer    Past Surgical History:  Procedure Laterality Date   CARPAL TUNNEL RELEASE Left 07/12/2015   Procedure: OPEN CARPAL TUNNEL RELEASE OF LEFT HAND;  Surgeon: Leanor Kail, MD;  Location: [place];  Service: Orthopedics;  Laterality: Left;   cervical radiofrequency neurotomy     FRACTURE SURGERY     MOUTH SURGERY     upper and lower dentures   NASAL SEPTUM SURGERY     occipital nerve blocks     SPINE SURGERY     cervical   Family History  Problem  Relation Age of Onset   Cancer Mother 36       breast cancer   Migraines Mother    Hypertension Mother    Arthritis Mother    Sleep apnea Mother    Anxiety disorder Mother    Depression Mother    Breast cancer Mother    Diabetes Father    Cancer Father 23       of spine , pancreas and liver   Anxiety disorder Father    Depression Father    Diabetes Brother    Hyperlipidemia Brother    Sleep apnea Brother    Cancer Maternal Grandfather        leukemia, prostate   Cancer Paternal Grandfather        bone   Cancer Maternal Aunt        breast   Mental illness Maternal Grandmother        Dementia   Stroke Paternal Grandmother    Diabetes Brother    Cancer Paternal Aunt        breast   Social History   Tobacco Use   Smoking status: Every Day    Packs/day:  5.00    Years: 20.00    Pack years: 100.00    Types: Cigarettes    Start date: 03/01/2011   Smokeless tobacco: Never   Tobacco comments:      Substance Use Topics   Alcohol use: No   Allergies  Allergen Reactions   Doxycycline    Lyrica [Pregabalin] Swelling    Ankles swell and turn red   Neurontin [Gabapentin]    Chantix [Varenicline] Rash   Prednisone Other (See Comments)    Makes her crazy   Other Other (See Comments)    Cats - cough/sneezing   Penicillin G Hives   Penicillins Hives    Has patient had a PCN reaction causing immediate rash, facial/tongue/throat swelling, SOB or lightheadedness with hypotension: No Has patient had a PCN reaction causing severe rash involving mucus membranes or skin necrosis: No Has patient had a PCN reaction that required hospitalization No Has patient had a PCN reaction occurring within the last 10 years: No If all of the above answers are "NO", then may proceed with Cephalosporin use.    Sulfa Antibiotics Hives, Rash and Other (See Comments)    Current Meds  Medication Sig   aspirin EC 81 MG EC tablet Take 1 tablet (81 mg total) by mouth daily. Swallow whole.   buPROPion (WELLBUTRIN XL) 150 MG 24 hr tablet Take 150 mg by mouth every morning.   DULoxetine (CYMBALTA) 60 MG capsule Take 1 capsule (60 mg total) by mouth daily.   EPINEPHrine 0.3 mg/0.3 mL IJ SOAJ injection Inject 0.3 mg into the muscle as needed for anaphylaxis.   fluticasone (FLONASE) 50 MCG/ACT nasal spray Place 2 sprays into both nostrils daily.   haloperidol (HALDOL) 5 MG tablet Take 1 tablet (5 mg total) by mouth 2 (two) times daily.   hydrOXYzine (ATARAX/VISTARIL) 50 MG tablet TAKE ONE TABLET BY MOUTH 3 TIMES DAILY AS NEEDED (DOSAGE INCREASE)   metFORMIN (GLUCOPHAGE) 500 MG tablet Take 1 tablet (500 mg total) by mouth daily with breakfast.   pantoprazole (PROTONIX) 40 MG tablet TAKE ONE TABLET EVERY DAY   PROAIR HFA 108 (90 Base) MCG/ACT inhaler Inhale 2 puffs into the  lungs every 6 (six) hours as needed for wheezing or shortness of breath.   Vitamin D, Ergocalciferol, (DRISDOL) 1.25 MG (50000 UNIT) CAPS capsule Take 1  capsule (50,000 Units total) by mouth every 7 (seven) days. (taking one tablet per week) walk in lab in office 1-2 weeks after completing prescription.   ziprasidone (GEODON) 60 MG capsule TAKE 1 CAPSULE BY MOUTH EVERY EVENING AFTER MEAL   Immunization History  Administered Date(s) Administered   Influenza-Unspecified 04/09/2014, 11/04/2015, 10/25/2018   Pneumococcal Polysaccharide-23 12/19/2008   Tdap 07/31/2014       Objective:   Physical Exam BP 100/70 (BP Location: Left Arm, Patient Position: Sitting, Cuff Size: Normal)   Pulse 98   Temp (!) 97.5 F (36.4 C) (Temporal)   Ht 5\' 6"  (1.676 m)   Wt 167 lb 11.2 oz (76.1 kg)   LMP 05/05/2020   SpO2 98%   BMI 27.07 kg/m  GENERAL: Overweight woman, no acute distress, very flat affect.  Fully ambulatory.  No conversational dyspnea. HEAD: Normocephalic, atraumatic.  EYES: Pupils equal, round, reactive to light.  No scleral icterus.  MOUTH: Nose/mouth/throat not examined due to masking requirements for COVID 19. NECK: Supple. No thyromegaly. Trachea midline. No JVD.  No adenopathy. PULMONARY: Good air entry bilaterally.  Diffuse end expiratory wheezing.   CARDIOVASCULAR: S1 and S2. Regular rate and rhythm.  No rubs, murmurs or gallops heard. ABDOMEN: Benign. MUSCULOSKELETAL: No joint deformity, no clubbing, no edema.  NEUROLOGIC: No focal deficit, no gait disturbance, speech is fluent.  SKIN: Intact,warm,dry. PSYCH: Anxious demeanor, flat affect  Representative slice of CT chest performed March 25, 2020 showing very small intrapleural lymph node:     Assessment & Plan:     ICD-10-CM   1. 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 Lung nodules  R91.8    Incidental finding, minute Can transition patient to lung cancer screening yearly    4. Tobacco dependence due to cigarettes  F17.210 Ambulatory Referral for Lung Cancer Scre   Patient counseled regards to discontinuation of smoking Refer to lung cancer screening program      Orders Placed This Encounter  Procedures   Allergen Panel (27) + IGE    Standing Status:   Future    Standing Expiration Date:   05/07/2021   CBC w/Diff    Standing Status:   Future    Standing Expiration Date:   05/07/2021   Ambulatory Referral for Lung Cancer Scre    Referral Priority:   Routine    Referral Type:   Consultation    Referral Reason:   Specialty Services Required    Number of Visits Requested:   1   Pulmonary Function Test [facility] Only    Standing Status:   Future    Number of Occurrences:   1    Standing Expiration Date:   05/07/2021    Scheduling Instructions:     3 weeks    Order Specific Question:   Full PFT: includes the following: basic spirometry, spirometry pre & post bronchodilator, diffusion capacity (DLCO), lung volumes    Answer:   Full PFT   Meds ordered this encounter  Medications   Fluticasone-Umeclidin-Vilant (TRELEGY  ELLIPTA) 100-62.5-25 MCG/INH AEPB    Sig: Inhale 1 puff into the lungs daily for 1 day.    Dispense:  14 each    Refill:  0    Order Specific Question:   Lot Number?    Answer:   [id number]    Order Specific Question:   Expiration Date?    Answer:   10/10/2021    Order Specific Question:   Manufacturer?    Answer:   [employer] [[id number]]    Order Specific Question:   Quantity    Answer:   1   Fluticasone-Umeclidin-Vilant (TRELEGY ELLIPTA) 100-62.5-25 MCG/INH AEPB    Sig: Inhale  1 puff into the lungs daily for 1 day.    Dispense:  14 each    Refill:  0    Order Specific Question:   Lot Number?    Answer:   [id number]    Order Specific Question:   Expiration Date?    Answer:   12/10/2021    Order Specific Question:   Manufacturer?    Answer:   [employer] [[id number]]    Order Specific Question:   Quantity    Answer:   1   Discussion:  Patient has had issues with chest tightness and shortness of breath which has been of longstanding.  She does have bronchospasm noted today.  Suspect that she has asthma/COPD overlap syndrome.  We will give her a trial of Trelegy Ellipta and obtain PFTs.  In addition we will check allergen panel to evaluate for potential allergy triggers.  I suspect however that her dyspnea is multifactorial and that there is an element of issues with her multiple psychiatric medications.  Echocardiogram performed 2021 was essentially benign.  No evidence of diastolic dysfunction or pulmonary hypertension.  We will see the patient in 4 to 6 weeks time she is to contact us prior to that time should any new difficulties arise.  Renold Don, MD  PCCM   *This note was dictated using voice recognition software/Dragon.  Despite best efforts to proofread, errors can occur which can change the meaning.  Any change was purely unintentional.

## 2020-05-07 NOTE — Progress Notes (Deleted)
   Subjective:    Patient ID: Becky Hubbard, female    DOB: [birth year], 50 y.o.   MRN: [id number]  HPI    Review of Systems     Objective:   Physical Exam        Assessment & Plan:

## 2020-05-09 LAB — EXTERNAL GENERIC LAB PROCEDURE

## 2020-05-09 LAB — COLOGUARD

## 2020-05-15 ENCOUNTER — Telehealth: Payer: Self-pay | Admitting: *Deleted

## 2020-05-15 NOTE — Telephone Encounter (Signed)
Spoke with patient via telephone. She had a CT Scan of the chest done 03/25/20, so she would not need to have another one until February 2023, unless something changes. She was encouraged to keep all of her previously scheduled appointments. Patient verbalized understanding.

## 2020-05-16 ENCOUNTER — Other Ambulatory Visit: Payer: Self-pay | Admitting: Adult Health

## 2020-05-16 DIAGNOSIS — Z1231 Encounter for screening mammogram for malignant neoplasm of breast: Secondary | ICD-10-CM

## 2020-05-17 ENCOUNTER — Telehealth: Payer: Self-pay | Admitting: Adult Health

## 2020-05-17 ENCOUNTER — Telehealth: Payer: Self-pay

## 2020-05-17 NOTE — Telephone Encounter (Signed)
Patient has been advised. KW 

## 2020-05-17 NOTE — Telephone Encounter (Signed)
PLEASE ADVISE PATIENT OF cologuard results below. Needs new sample.

## 2020-05-17 NOTE — Telephone Encounter (Signed)
[employer] colo guard received results on 05/17/20 and reports sample could not be processed and that company will call her to schedule another test if she does not hear from them or wants to proceed with a colonoscopy please let her know to reach out to our office.    Media Information         Document Information  Photos  Cologuard sample not processed   05/17/2020 08:33  Attached To:  Killian  Chamar Broughton, Kelby Aline, [place]

## 2020-05-17 NOTE — Telephone Encounter (Signed)
ATC patient to relay date/time of covid test prior to PFT.  [facility]21/2020 [facility]

## 2020-05-20 NOTE — Telephone Encounter (Signed)
ATC x2--phone was answered but patient did speak.  Will close encounter per office protocol.

## 2020-05-21 ENCOUNTER — Other Ambulatory Visit: Payer: Self-pay

## 2020-05-21 ENCOUNTER — Other Ambulatory Visit
Admission: RE | Admit: 2020-05-21 | Discharge: 2020-05-21 | Disposition: A | Discharge status: Home / Self Care | Payer: Medicaid Other | Source: Ambulatory Visit | Attending: Pulmonary Disease | Admitting: Pulmonary Disease

## 2020-05-21 ENCOUNTER — Encounter: Payer: Self-pay | Admitting: Adult Health

## 2020-05-21 DIAGNOSIS — Z01812 Encounter for preprocedural laboratory examination: Secondary | ICD-10-CM | POA: Insufficient documentation

## 2020-05-21 DIAGNOSIS — U071 COVID-19: Secondary | ICD-10-CM | POA: Insufficient documentation

## 2020-05-21 LAB — COLOGUARD
COLOGUARD: NEGATIVE
Cologuard: NEGATIVE

## 2020-05-21 LAB — EXTERNAL GENERIC LAB PROCEDURE: COLOGUARD: NEGATIVE

## 2020-05-21 LAB — SARS CORONAVIRUS 2 (TAT 6-24 HRS): SARS Coronavirus 2: POSITIVE — AB

## 2020-05-21 NOTE — Progress Notes (Signed)
Patient does not have MY CHART please have her sign up and notify of COLOGUARD negative as in letter repeat 3 years.

## 2020-05-22 ENCOUNTER — Other Ambulatory Visit: Payer: Self-pay | Admitting: Psychiatry

## 2020-05-22 ENCOUNTER — Ambulatory Visit: Payer: Medicaid Other | Attending: Pulmonary Disease

## 2020-05-22 ENCOUNTER — Telehealth (HOSPITAL_COMMUNITY): Payer: Self-pay

## 2020-05-22 NOTE — Telephone Encounter (Signed)
Called to Discuss with patient about Covid symptoms and the use of the monoclonal antibody infusion for those with mild to moderate Covid symptoms and at a high risk of hospitalization.     Pt is qualified for this infusion due to co-morbid conditions and/or a member of an at-risk group.     Patient Active Problem List   Diagnosis Date Noted  . B12 deficiency 04/29/2020  . Neuropathy of both feet 01/08/2020  . Major depressive disorder, recurrent episode, severe, with psychosis ([place]) 01/07/2020  . Type 2 diabetes mellitus without complication, without long-term current use of insulin ([place]) 11/01/2019  . Other hyperlipidemia 11/01/2019  . Gastroesophageal reflux disease 05/05/2019  . History of psoriasis 11/03/2018  . Acute delirium 07/05/2017  . CAP (community acquired pneumonia) 07/04/2017  . Tachycardia 07/04/2017  . Acute encephalopathy 07/04/2017  . Chronic allergic rhinitis due to pollen 03/09/2016  . Deviated nasal septum 03/09/2016  . Tobacco use 03/03/2016  . COPD exacerbation ([place]) 02/11/2016  . Obsessive-compulsive disorder 01/17/2015  . Borderline diabetes 09/04/2014  . Depression, major, recurrent, moderate ([place]) 08/21/2014  . ADD (attention deficit disorder) 07/31/2014  . Dermatitis, eczematoid 07/31/2014  . Allergy to environmental factors 07/31/2014  . Disorder of nervous system 07/31/2014  . 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 Plaque psoriasis 07/31/2014  . Cicatrix 07/31/2014  . Vitamin D deficiency 07/31/2014  . Colon cancer screening 07/31/2014  . Loss of feeling or sensation 06/04/2014  . Herniated cervical disc   . PTSD (post-traumatic stress disorder)   . Nonspecific abnormal results of endocrine function study 04/19/2014  . Carpal tunnel syndrome 10/03/2012  . Chest pain of uncertain etiology 49/44/9675  . Acid indigestion 07/21/2012  . 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 Fibromyalgia syndrome 01/22/2012  . Fibrositis 01/22/2012  . Cervical osteoarthritis  03/10/2011    Patient declines infusion at this time. Symptoms tier reviewed as well as criteria for ending isolation. Preventative practices reviewed. Patient verbalized understanding.    Patient advised to call back if he/she decides that he/she does want to get infusion. Callback number to the infusion center given. Patient advised to go to Urgent care or ED with severe symptoms.

## 2020-05-22 NOTE — Telephone Encounter (Signed)
Patient is positive for covid.   PFT scheduled for 05/22/2020 will need to rescheduled.  Patient is aware and voiced her understanding.  Rodena Piety, please advise. thanks

## 2020-05-27 ENCOUNTER — Telehealth: Payer: Self-pay

## 2020-05-27 NOTE — Telephone Encounter (Signed)
Copied from [place] [id number]. Topic: General - Inquiry >> May 27, 2020  4:28 PM Loma [place] wrote: Reason for CRM Pt has called in as got notification of Colo guard results being sent to PCP pls fu with pt at [phone]

## 2020-05-28 NOTE — Progress Notes (Signed)
Cologuard is negative recommendation is repeat in 3 years unless clinically indicated sooner.

## 2020-05-28 NOTE — Telephone Encounter (Signed)
Patient advised of negative cologuard result.

## 2020-06-04 ENCOUNTER — Ambulatory Visit: Payer: Medicaid Other

## 2020-06-04 ENCOUNTER — Ambulatory Visit
Admission: RE | Admit: 2020-06-04 | Discharge: 2020-06-04 | Disposition: A | Discharge status: Home / Self Care | Payer: Medicaid Other | Source: Ambulatory Visit | Attending: Adult Health | Admitting: Adult Health

## 2020-06-04 ENCOUNTER — Other Ambulatory Visit: Payer: Self-pay

## 2020-06-04 DIAGNOSIS — J449 Chronic obstructive pulmonary disease, unspecified: Secondary | ICD-10-CM | POA: Insufficient documentation

## 2020-06-04 DIAGNOSIS — R0602 Shortness of breath: Secondary | ICD-10-CM | POA: Diagnosis present

## 2020-06-04 DIAGNOSIS — Z1231 Encounter for screening mammogram for malignant neoplasm of breast: Secondary | ICD-10-CM | POA: Insufficient documentation

## 2020-06-04 MED ORDER — ALBUTEROL SULFATE (2.5 MG/3ML) 0.083% IN NEBU
2.5000 mg | INHALATION_SOLUTION | Freq: Once | RESPIRATORY_TRACT | Status: AC
  Administered 2020-06-04: 2.5 mg via RESPIRATORY_TRACT
  Filled 2020-06-04: qty 3

## 2020-06-05 NOTE — Progress Notes (Signed)
Mammogram within normal limits repeat in one year advised.  FINDINGS: There are no findings suspicious for malignancy. The images were evaluated with computer-aided detection.  IMPRESSION: No mammographic evidence of malignancy. A result letter of this screening mammogram will be mailed directly to the patient.  RECOMMENDATION: Screening mammogram in one year. (Code:[id number])  BI-RADS CATEGORY  1: Negative.   Electronically Signed   By: Franki Cabot M.D.   On: 06/05/2020 11:26

## 2020-06-06 LAB — PULMONARY FUNCTION TEST ARMC ONLY
DL/VA % pred: 121 %
DL/VA: 5.13 ml/min/mmHg/L
DLCO unc % pred: 91 %
DLCO unc: 20.92 ml/min/mmHg
FEF 25-75 Post: 4.03 L/sec
FEF 25-75 Pre: 3.66 L/sec
FEF2575-%Change-Post: 10 %
FEF2575-%Pred-Post: 140 %
FEF2575-%Pred-Pre: 127 %
FEV1-%Change-Post: 0 %
FEV1-%Pred-Post: 87 %
FEV1-%Pred-Pre: 86 %
FEV1-Post: 2.64 L
FEV1-Pre: 2.63 L
FEV1FVC-%Change-Post: 6 %
FEV1FVC-%Pred-Pre: 112 %
FEV6-%Change-Post: -6 %
FEV6-%Pred-Post: 73 %
FEV6-%Pred-Pre: 78 %
FEV6-Post: 2.73 L
FEV6-Pre: 2.91 L
FEV6FVC-%Pred-Post: 102 %
FEV6FVC-%Pred-Pre: 102 %
FVC-%Change-Post: -6 %
FVC-%Pred-Post: 71 %
FVC-%Pred-Pre: 76 %
FVC-Post: 2.73 L
FVC-Pre: 2.91 L
Post FEV1/FVC ratio: 97 %
Post FEV6/FVC ratio: 100 %
Pre FEV1/FVC ratio: 90 %
Pre FEV6/FVC Ratio: 100 %
RV % pred: 78 %
RV: 1.52 L
TLC % pred: 76 %
TLC: 4.13 L

## 2020-06-07 ENCOUNTER — Ambulatory Visit: Payer: Medicaid Other | Admitting: Pulmonary Disease

## 2020-07-15 ENCOUNTER — Other Ambulatory Visit: Payer: Self-pay

## 2020-07-15 ENCOUNTER — Emergency Department
Admission: EM | Admit: 2020-07-15 | Discharge: 2020-07-16 | Disposition: A | Discharge status: Home / Self Care | Payer: Medicaid Other | Attending: Emergency Medicine | Admitting: Emergency Medicine

## 2020-07-15 DIAGNOSIS — E1140 Type 2 diabetes mellitus with diabetic neuropathy, unspecified: Secondary | ICD-10-CM | POA: Insufficient documentation

## 2020-07-15 DIAGNOSIS — E1143 Type 2 diabetes mellitus with diabetic autonomic (poly)neuropathy: Secondary | ICD-10-CM | POA: Diagnosis not present

## 2020-07-15 DIAGNOSIS — X58XXXA Exposure to other specified factors, initial encounter: Secondary | ICD-10-CM | POA: Insufficient documentation

## 2020-07-15 DIAGNOSIS — Z7984 Long term (current) use of oral hypoglycemic drugs: Secondary | ICD-10-CM | POA: Diagnosis not present

## 2020-07-15 DIAGNOSIS — T7840XA Allergy, unspecified, initial encounter: Secondary | ICD-10-CM | POA: Diagnosis not present

## 2020-07-15 DIAGNOSIS — L299 Pruritus, unspecified: Secondary | ICD-10-CM | POA: Diagnosis present

## 2020-07-15 DIAGNOSIS — F17210 Nicotine dependence, cigarettes, uncomplicated: Secondary | ICD-10-CM | POA: Insufficient documentation

## 2020-07-15 DIAGNOSIS — R2232 Localized swelling, mass and lump, left upper limb: Secondary | ICD-10-CM | POA: Insufficient documentation

## 2020-07-15 DIAGNOSIS — J441 Chronic obstructive pulmonary disease with (acute) exacerbation: Secondary | ICD-10-CM | POA: Diagnosis not present

## 2020-07-15 DIAGNOSIS — Z7982 Long term (current) use of aspirin: Secondary | ICD-10-CM | POA: Diagnosis not present

## 2020-07-15 MED ORDER — EPINEPHRINE 0.3 MG/0.3ML IJ SOAJ: 0.3000 mg | INTRAMUSCULAR | 1 refills | Status: AC | PRN

## 2020-07-15 MED ORDER — DEXAMETHASONE SODIUM PHOSPHATE 10 MG/ML IJ SOLN
10.0000 mg | Freq: Once | INTRAMUSCULAR | Status: AC
  Administered 2020-07-15: 10 mg via INTRAVENOUS
  Filled 2020-07-15: qty 1

## 2020-07-15 MED ORDER — FAMOTIDINE IN NACL 20-0.9 MG/50ML-% IV SOLN
20.0000 mg | Freq: Once | INTRAVENOUS | Status: AC
  Administered 2020-07-15: 20 mg via INTRAVENOUS
  Filled 2020-07-15: qty 50

## 2020-07-15 MED ORDER — DIPHENHYDRAMINE HCL 50 MG/ML IJ SOLN
50.0000 mg | Freq: Once | INTRAMUSCULAR | Status: AC
  Administered 2020-07-15: 50 mg via INTRAVENOUS
  Filled 2020-07-15: qty 1

## 2020-07-15 MED ORDER — SODIUM CHLORIDE 0.9 % IV BOLUS
1000.0000 mL | Freq: Once | INTRAVENOUS | Status: AC
  Administered 2020-07-15: 1000 mL via INTRAVENOUS

## 2020-07-15 MED ORDER — LORAZEPAM 2 MG/ML IJ SOLN
0.5000 mg | Freq: Once | INTRAMUSCULAR | Status: AC
  Administered 2020-07-15: 0.5 mg via INTRAVENOUS
  Filled 2020-07-15: qty 1

## 2020-07-15 NOTE — ED Provider Notes (Signed)
[facility] Emergency Department Provider Note  Time seen: 9:30 PM  I have reviewed the triage vital signs and the nursing notes.   HISTORY  Chief Complaint Allergic Reaction   HPI Becky Hubbard is a 50 y.o. female with a past medical history of ADD, diabetes, depression, fibromyalgia, gastric reflux, history of allergic reactions in the past, presents to the emergency department for possible allergic reaction.  According to the patient last night she began having some itching around her left arm and noticed her left hand was somewhat swollen.  Patient states she did get her nails done yesterday but otherwise no new exposures.  She states today the itching has spread across her body and over the past few hours she has began experiencing swelling around her face.  Patient states she took her EpiPen at home around 8 PM and came to the emergency department shortly afterwards.  Here the patient appears well denies any nausea vomiting dyspnea or swelling within her mouth or throat.  Patient does have swelling around her eyes and somewhat around her face.   Past Medical History:  Diagnosis Date  . ADD (attention deficit disorder)   . Anxiety   . Arthritis   . Auditory hallucinations   . Carpal tunnel syndrome on both sides   . Cervical spine fracture ([place]) 2008   Closed with screw C2-3 [facility] Dr. Delilah Shan  . Degenerative joint disease of spine   . Depression   . Diabetes mellitus without complication ([place])   . Family history of adverse reaction to anesthesia    mom- slow to awaken  . Fibromyalgia   . Foot fracture, left   . Gastroparesis   . Gastroparesis   . GERD (gastroesophageal reflux disease)   . Hearing loss   . Herniated cervical disc   . History of prescription drug abuse   . Neuromuscular disorder (HCC)    fibromyalgia/chronic fatique syndrome  . Neuropathy of foot   . OCD (obsessive compulsive disorder)   . Other specified disorder of skin     neurological skin disorder-breaks out when nervous  . 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 Plaque psoriasis   . PTSD (post-traumatic stress disorder)   . Stroke ([place])   . Ulcer     Patient Active Problem List   Diagnosis Date Noted  . B12 deficiency 04/29/2020  . Neuropathy of both feet 01/08/2020  . Major depressive disorder, recurrent episode, severe, with psychosis ([place]) 01/07/2020  . Type 2 diabetes mellitus without complication, without long-term current use of insulin ([place]) 11/01/2019  . Other hyperlipidemia 11/01/2019  . Gastroesophageal reflux disease 05/05/2019  . History of psoriasis 11/03/2018  . Acute delirium 07/05/2017  . CAP (community acquired pneumonia) 07/04/2017  . Tachycardia 07/04/2017  . Acute encephalopathy 07/04/2017  . Chronic allergic rhinitis due to pollen 03/09/2016  . Deviated nasal septum 03/09/2016  . Tobacco use 03/03/2016  . COPD exacerbation ([place]) 02/11/2016  . Obsessive-compulsive disorder 01/17/2015  . Borderline diabetes 09/04/2014  . Depression, major, recurrent, moderate ([place]) 08/21/2014  . ADD (attention deficit disorder) 07/31/2014  . Dermatitis, eczematoid 07/31/2014  . Allergy to environmental factors 07/31/2014  . Disorder of nervous system 07/31/2014  . 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 Plaque psoriasis 07/31/2014  . Cicatrix 07/31/2014  . Vitamin D deficiency 07/31/2014  . Colon cancer screening 07/31/2014  . Loss of feeling or sensation 06/04/2014  . Herniated cervical disc   . PTSD (post-traumatic  stress disorder)   . Nonspecific abnormal results of endocrine function study 04/19/2014  . Carpal tunnel syndrome 10/03/2012  . Chest pain of uncertain etiology 53/97/6734  . Acid indigestion 07/21/2012  . 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 Fibromyalgia syndrome 01/22/2012  . Fibrositis 01/22/2012  . Cervical osteoarthritis 03/10/2011    Past Surgical History:  Procedure Laterality Date  . CARPAL TUNNEL  RELEASE Left 07/12/2015   Procedure: OPEN CARPAL TUNNEL RELEASE OF LEFT HAND;  Surgeon: Leanor Kail, MD;  Location: [place];  Service: Orthopedics;  Laterality: Left;  . cervical radiofrequency neurotomy    . FRACTURE SURGERY    . MOUTH SURGERY     upper and lower dentures  . NASAL SEPTUM SURGERY    . occipital nerve blocks    . SPINE SURGERY     cervical    Prior to Admission medications   Medication Sig Start Date End Date Taking? Authorizing Provider  aspirin EC 81 MG EC tablet Take 1 tablet (81 mg total) by mouth daily. Swallow whole. 01/19/20   Clapacs, Madie Reno, MD  buPROPion (WELLBUTRIN XL) 150 MG 24 hr tablet Take 150 mg by mouth every morning. 04/05/20   [provider]  DULoxetine (CYMBALTA) 60 MG capsule Take 1 capsule (60 mg total) by mouth daily. 01/19/20   Clapacs, Madie Reno, MD  EPINEPHrine 0.3 mg/0.3 mL IJ SOAJ injection Inject 0.3 mg into the muscle as needed for anaphylaxis. 11/22/19   Blake Divine, MD  fluticasone (FLONASE) 50 MCG/ACT nasal spray Place 2 sprays into both nostrils daily. 10/31/19   [provider]  haloperidol (HALDOL) 5 MG tablet Take 1 tablet (5 mg total) by mouth 2 (two) times daily. 01/18/20   Clapacs, Madie Reno, MD  hydrOXYzine (ATARAX/VISTARIL) 50 MG tablet TAKE ONE TABLET BY MOUTH 3 TIMES DAILY AS NEEDED (DOSAGE INCREASE) 04/20/20   [provider]  lisdexamfetamine (VYVANSE) 40 MG capsule Take 1 capsule (40 mg total) by mouth daily. Patient not taking: No sig reported 01/19/20   Clapacs, Madie Reno, MD  metFORMIN (GLUCOPHAGE) 500 MG tablet Take 1 tablet (500 mg total) by mouth daily with breakfast. 01/19/20   Clapacs, Madie Reno, MD  pantoprazole (PROTONIX) 40 MG tablet TAKE ONE TABLET EVERY DAY 09/04/16   Wynetta Emery, Megan P, DO  PROAIR HFA 108 [phone] Base) MCG/ACT inhaler Inhale 2 puffs into the lungs every 6 (six) hours as needed for wheezing or shortness of breath. 10/02/19   [provider]  Vitamin D, Ergocalciferol,  (DRISDOL) 1.25 MG (50000 UNIT) CAPS capsule Take 1 capsule (50,000 Units total) by mouth every 7 (seven) days. (taking one tablet per week) walk in lab in office 1-2 weeks after completing prescription. 04/26/20   Flinchum, Kelby Aline, FNP  ziprasidone (GEODON) 60 MG capsule TAKE 1 CAPSULE BY MOUTH EVERY EVENING AFTER MEAL 04/09/20   [provider]    Allergies  Allergen Reactions  . Doxycycline   . Lyrica [Pregabalin] Swelling    Ankles swell and turn red  . Neurontin [Gabapentin]   . Chantix [Varenicline] Rash  . Prednisone Other (See Comments)    Makes her crazy  . Other Other (See Comments)    Cats - cough/sneezing  . Penicillin G Hives  . Penicillins Hives    Has patient had a PCN reaction causing immediate rash, facial/tongue/throat swelling, SOB or lightheadedness with hypotension: No Has patient had a PCN reaction causing severe rash involving mucus membranes or skin necrosis: No Has patient had a PCN  reaction that required hospitalization No Has patient had a PCN reaction occurring within the last 10 years: No If all of the above answers are "NO", then may proceed with Cephalosporin use.   . Sulfa Antibiotics Hives, Rash and Other (See Comments)    Family History  Problem Relation Age of Onset  . Cancer Mother 39       breast cancer  . Migraines Mother   . Hypertension Mother   . Arthritis Mother   . Sleep apnea Mother   . Anxiety disorder Mother   . Depression Mother   . Breast cancer Mother   . Diabetes Father   . Cancer Father 100       of spine , pancreas and liver  . Anxiety disorder Father   . Depression Father   . Diabetes Brother   . Hyperlipidemia Brother   . Sleep apnea Brother   . Cancer Maternal Grandfather        leukemia, prostate  . Cancer Paternal Grandfather        bone  . Cancer Maternal Aunt        breast  . Mental illness Maternal Grandmother        Dementia  . Stroke Paternal Grandmother   . Diabetes Brother   . Cancer  Paternal Aunt        breast    Social History Social History   Tobacco Use  . Smoking status: Current Every Day Smoker    Packs/day: 2.00    Years: 20.00    Pack years: 40.00    Types: Cigarettes    Start date: 03/01/2011  . Smokeless tobacco: Never Used  . Tobacco comment: started at 57, smokes 2 packs a day.  Vaping Use  . Vaping Use: Never used  Substance Use Topics  . Alcohol use: No  . Drug use: No    Review of Systems Constitutional: Negative for fever. Cardiovascular: Negative for chest pain. Respiratory: Negative for shortness of breath. Gastrointestinal: Negative for abdominal pain, vomiting Genitourinary: Negative for urinary compaints Musculoskeletal: Negative for musculoskeletal complaints Skin: Itching in the upper extremities chest and face.  Swelling around the eyes and face. Neurological: Negative for headache All other ROS negative  ____________________________________________   PHYSICAL EXAM:  VITAL SIGNS: ED Triage Vitals  Enc Vitals Group     BP 07/15/20 2120 (!) 238/201     Pulse Rate 07/15/20 2120 (!) 49     Resp 07/15/20 2120 20     Temp 07/15/20 2120 99.2 F (37.3 C)     Temp Source 07/15/20 2120 Oral     SpO2 07/15/20 2120 100 %     Weight 07/15/20 2119 170 lb (77.1 kg)     Height 07/15/20 2119 5\' 6"  (1.676 m)     Head Circumference --      Peak Flow --      Pain Score 07/15/20 2119 8     Pain Loc --      Pain Edu? --      Excl. in [place]? --    Constitutional: Alert and oriented. Well appearing and in no distress. Eyes: Normal exam ENT      Head: Normocephalic and atraumatic.      Mouth/Throat: Mucous membranes are moist. Cardiovascular: Normal rate, regular rhythm.  Respiratory: Normal respiratory effort without tachypnea nor retractions. Breath sounds are clear Gastrointestinal: Soft and nontender. No distention. Musculoskeletal: Nontender with normal range of motion in all extremities Neurologic:  Normal speech and language.  No gross focal neurologic deficits Skin:  Skin is warm, dry and intact.  Psychiatric: Mood and affect are normal.   ____________________________________________   INITIAL IMPRESSION / ASSESSMENT AND PLAN / ED COURSE  Pertinent labs & imaging results that were available during my care of the patient were reviewed by me and considered in my medical decision making (see chart for details).   Patient presents emergency department for likely allergic reaction to unknown antigen.  Patient taken EpiPen around 8 PM.  We will continue to monitor in the emergency department.  We will dose Benadryl, Pepcid, Decadron and fluids.  Patient states that history of prednisone making her "crazy" however given the significant allergic reaction I do believe a one-time dose of Decadron while being monitored in the emergency department would still be appropriate.    Becky Hubbard was evaluated in Emergency Department on 07/15/2020 for the symptoms described in the history of present illness. She was evaluated in the context of the global COVID-19 pandemic, which necessitated consideration that the patient might be at risk for infection with the SARS-CoV-2 virus that causes COVID-19. Institutional protocols and algorithms that pertain to the evaluation of patients at risk for COVID-19 are in a state of rapid change based on information released by regulatory bodies including the CDC and federal and state organizations. These policies and algorithms were followed during the patient's care in the ED.  ____________________________________________   FINAL CLINICAL IMPRESSION(S) / ED DIAGNOSES  Allergic reaction   Harvest Dark, MD 07/16/20 1944

## 2020-07-15 NOTE — Discharge Instructions (Signed)
Please use Benadryl 50 mg every 6 hours as needed for itching.  Please use your EpiPen if you have any swelling in your mouth throat or trouble breathing.  Please return to the emergency department immediately if you use your EpiPen.  Please call the number provided to follow-up with an allergist.  Return to the emergency department for any symptom personally concerning to yourself.

## 2020-07-15 NOTE — ED Triage Notes (Signed)
Pt in with co allergic reaction since yesterday, swelling noted to eyes and lip swelling. States feels like inside of mouth "feels weird". Did use epi pen 1 hr pta, states here for the same in the past. Pt is unsure of cause and has not been to allergist.

## 2020-07-16 NOTE — ED Provider Notes (Signed)
Procedures     ----------------------------------------- 12:21 AM on 07/16/2020 ----------------------------------------- Patient reassessed, feeling better.  Reports that her facial swelling is gone, she is able to eat and drink without difficulty swallowing or breathing.  Lungs are clear to auscultation bilaterally without wheezing.  Stable for discharge.  EpiPen refill has already been sent to her pharmacy.  She will take Benadryl for the next several days.     Carrie Mew, MD 07/16/20 [place] [place]

## 2020-07-31 ENCOUNTER — Encounter: Payer: Self-pay | Admitting: Pulmonary Disease

## 2020-07-31 ENCOUNTER — Ambulatory Visit (INDEPENDENT_AMBULATORY_CARE_PROVIDER_SITE_OTHER): Payer: Medicaid Other | Admitting: Pulmonary Disease

## 2020-07-31 ENCOUNTER — Telehealth: Payer: Self-pay | Admitting: Pulmonary Disease

## 2020-07-31 ENCOUNTER — Other Ambulatory Visit: Payer: Self-pay

## 2020-07-31 VITALS — BP 144/88 | HR 86 | Temp 97.7°F | Ht 66.0 in | Wt 172.2 lb

## 2020-07-31 DIAGNOSIS — R0602 Shortness of breath: Secondary | ICD-10-CM

## 2020-07-31 DIAGNOSIS — J4489 Other specified chronic obstructive pulmonary disease: Secondary | ICD-10-CM

## 2020-07-31 DIAGNOSIS — J449 Chronic obstructive pulmonary disease, unspecified: Secondary | ICD-10-CM

## 2020-07-31 DIAGNOSIS — F17210 Nicotine dependence, cigarettes, uncomplicated: Secondary | ICD-10-CM | POA: Diagnosis not present

## 2020-07-31 MED ORDER — TRELEGY ELLIPTA 100-62.5-25 MCG/INH IN AEPB: 1.0000 | INHALATION_SPRAY | Freq: Every day | RESPIRATORY_TRACT | 11 refills | Status: DC

## 2020-07-31 MED ORDER — FLUTICASONE-SALMETEROL 250-50 MCG/ACT IN AEPB: 1.0000 | INHALATION_SPRAY | Freq: Two times a day (BID) | RESPIRATORY_TRACT | 6 refills | Status: DC

## 2020-07-31 MED ORDER — SPIRIVA HANDIHALER 18 MCG IN CAPS: 18.0000 ug | ORAL_CAPSULE | Freq: Every day | RESPIRATORY_TRACT | 6 refills | Status: DC

## 2020-07-31 NOTE — Telephone Encounter (Signed)
Patient is aware of below message and voiced her understanding. Rx for Spiriva and Advair has been sent to preferred pharmacy. Nothing further needed.

## 2020-07-31 NOTE — Patient Instructions (Signed)
Continue Trelegy inhaler  Please keep working on quitting smoking  We will see him in follow-up in 4 months time, call sooner should any new problems arise

## 2020-07-31 NOTE — Telephone Encounter (Signed)
Lm for patient.  

## 2020-07-31 NOTE — Telephone Encounter (Signed)
She will need Advair 250/50 1 inhalation twice a day, rinse mouth well after use and Spiriva HandiHaler 1 capsule inhaled daily.  If she needs assistance with how to use them she is welcome to have a show her here at the clinic.  Prescribe enough for 1 month x 6 refills.

## 2020-07-31 NOTE — Progress Notes (Signed)
Subjective:    Patient ID: Becky Hubbard, female    DOB: [birth year], 50 y.o.   MRN: [id number]  Chief Complaint  Patient presents with   Follow-up    Review PFT--sob with exertion, prod cough with clear sputum and wheezing.     HPI Patient is a 50 year old current smoker (3 PPD) who follows here for the issue of dyspnea.  This is a scheduled visit.  She was initially seen on 07 May 2020.  She never had her allergen panel or CBC performed.  She cannot explain why.  She is very challenging patient due to very limited interaction with the examiner.  She has a very flat affect and is on multiple psychiatric medications.  At her initial visit she was noted to have bronchospasm and was started on Trelegy Ellipta.  She has been using this medication and notes that it is helpful to her.  She had pulmonary function testing that showed mild restriction likely on the basis of overweight.  Diffusion capacity was normal.  She did have small airways component with response to bronchodilator on the small airways.  This may indicate an asthmatic component.  She resides with her mother who has multiple cats.  She may be allergic to cats however again did not have allergen panel performed.  Her initial visit we also reviewed a small lung nodules that were noted incidentally on a CT.  These are minute.  The patient has been transitioned over to the lung cancer screening program.  She does not endorse any other symptomatology today.  As noted she interacts very little with the examiner.  DATA: 11/15/2019 2D echo: LVEF 60 to 65%.  No wall motion abnormality.  No valvular abnormality. 03/25/2020 CT chest: Mild subpleural scarring posterior medial right upper lobe.  Subpleural left lower lobe nodules 5 mm in size, no adenopathy no parenchymal abnormalities. 06/04/2020 PFTs: Mild restrictive physiology likely due to overweight, small airways component, diffusion capacity normal  Review of Systems A 10 point review  of systems was performed and it is as noted above otherwise negative.  Patient Active Problem List   Diagnosis Date Noted   B12 deficiency 04/29/2020   Neuropathy of both feet 01/08/2020   Major depressive disorder, recurrent episode, severe, with psychosis ([place]) 01/07/2020   Type 2 diabetes mellitus without complication, without long-term current use of insulin ([place]) 11/01/2019   Other hyperlipidemia 11/01/2019   Gastroesophageal reflux disease 05/05/2019   History of psoriasis 11/03/2018   Acute delirium 07/05/2017   CAP (community acquired pneumonia) 07/04/2017   Tachycardia 07/04/2017   Acute encephalopathy 07/04/2017   Chronic allergic rhinitis due to pollen 03/09/2016   Deviated nasal septum 03/09/2016   Tobacco use 03/03/2016   COPD exacerbation ([place]) 02/11/2016   Obsessive-compulsive disorder 01/17/2015   Borderline diabetes 09/04/2014   Depression, major, recurrent, moderate ([place]) 08/21/2014   ADD (attention deficit disorder) 07/31/2014   Dermatitis, eczematoid 07/31/2014   Allergy to environmental factors 07/31/2014   Disorder of nervous system 07/31/2014   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   Plaque psoriasis 07/31/2014   Cicatrix 07/31/2014   Vitamin D deficiency 07/31/2014   Colon cancer screening 07/31/2014   Loss of feeling or sensation 06/04/2014   Herniated cervical disc    PTSD (post-traumatic stress disorder)    Nonspecific abnormal results of endocrine function study 04/19/2014   Carpal tunnel syndrome 10/03/2012   Chest pain of uncertain etiology 66/59/9357   Acid indigestion 07/21/2012   Gastric atony 07/21/2012   Fibromyalgia  syndrome 01/22/2012   Fibrositis 01/22/2012   Cervical osteoarthritis 03/10/2011   Social History   Tobacco Use   Smoking status: Every Day    Packs/day: 2.00    Years: 20.00    Pack years: 40.00    Types: Cigarettes    Start date: 03/01/2011   Smokeless tobacco: Never   Tobacco comments:    started at 50, smokes 2 packs a  day.  Substance Use Topics   Alcohol use: No   Allergies  Allergen Reactions   Doxycycline    Lyrica [Pregabalin] Swelling    Ankles swell and turn red   Neurontin [Gabapentin]    Chantix [Varenicline] Rash   Prednisone Other (See Comments)    Makes her crazy   Other Other (See Comments)    Cats - cough/sneezing   Penicillin G Hives   Penicillins Hives    Has patient had a PCN reaction causing immediate rash, facial/tongue/throat swelling, SOB or lightheadedness with hypotension: No Has patient had a PCN reaction causing severe rash involving mucus membranes or skin necrosis: No Has patient had a PCN reaction that required hospitalization No Has patient had a PCN reaction occurring within the last 10 years: No If all of the above answers are "NO", then may proceed with Cephalosporin use.    Sulfa Antibiotics Hives, Rash and Other (See Comments)   Current Meds  Medication Sig   aspirin EC 81 MG EC tablet Take 1 tablet (81 mg total) by mouth daily. Swallow whole.   buPROPion (WELLBUTRIN XL) 150 MG 24 hr tablet Take 150 mg by mouth every morning.   DULoxetine (CYMBALTA) 60 MG capsule Take 1 capsule (60 mg total) by mouth daily.   EPINEPHrine (EPIPEN 2-PAK) 0.3 mg/0.3 mL IJ SOAJ injection Inject 0.3 mg into the muscle as needed for anaphylaxis.   EPINEPHrine 0.3 mg/0.3 mL IJ SOAJ injection Inject 0.3 mg into the muscle as needed for anaphylaxis.   fluticasone (FLONASE) 50 MCG/ACT nasal spray Place 2 sprays into both nostrils daily.   Fluticasone-Umeclidin-Vilant (TRELEGY ELLIPTA) 100-62.5-25 MCG/INH AEPB Inhale 1 puff into the lungs daily.   haloperidol (HALDOL) 5 MG tablet Take 1 tablet (5 mg total) by mouth 2 (two) times daily.   hydrOXYzine (ATARAX/VISTARIL) 50 MG tablet TAKE ONE TABLET BY MOUTH 3 TIMES DAILY AS NEEDED (DOSAGE INCREASE)   lisdexamfetamine (VYVANSE) 40 MG capsule Take 1 capsule (40 mg total) by mouth daily.   metFORMIN (GLUCOPHAGE) 500 MG tablet Take 1 tablet (500  mg total) by mouth daily with breakfast.   pantoprazole (PROTONIX) 40 MG tablet TAKE ONE TABLET EVERY DAY   PROAIR HFA 108 (90 Base) MCG/ACT inhaler Inhale 2 puffs into the lungs every 6 (six) hours as needed for wheezing or shortness of breath.   Vitamin D, Ergocalciferol, (DRISDOL) 1.25 MG (50000 UNIT) CAPS capsule Take 1 capsule (50,000 Units total) by mouth every 7 (seven) days. (taking one tablet per week) walk in lab in office 1-2 weeks after completing prescription.   ziprasidone (GEODON) 60 MG capsule TAKE 1 CAPSULE BY MOUTH EVERY EVENING AFTER MEAL   Immunization History  Administered Date(s) Administered   Influenza-Unspecified 04/09/2014, 11/04/2015, 10/25/2018   Pneumococcal Polysaccharide-23 12/19/2008   Tdap 07/31/2014        Objective:   Physical Exam BP (!) 144/88 (BP Location: Left Arm, Cuff Size: Normal)   Pulse 86   Temp 97.7 F (36.5 C) (Temporal)   Ht 5\' 6"  (1.676 m)   Wt 172 lb 3.2 oz (78.1  kg)   LMP 07/08/2020   SpO2 97%   BMI 27.79 kg/m  GENERAL: Well-developed overweight woman, no acute distress.  Flat affect.  Fully ambulatory.  No external dyspnea. HEAD: Normocephalic, atraumatic.  EYES: Pupils equal, round, reactive to light.  No scleral icterus.  MOUTH: Nose/mouth/throat not examined due to masking requirements for COVID 19. NECK: Supple. No thyromegaly. Trachea midline. No JVD.  No adenopathy. PULMONARY: Good air entry bilaterally.  No adventitious sounds. CARDIOVASCULAR: S1 and S2. Regular rate and rhythm.  No rubs, murmurs or gallops heard. ABDOMEN: Benign. MUSCULOSKELETAL: No joint deformity, no clubbing, no edema.  NEUROLOGIC: Mild psychomotor retardation noted SKIN: Intact,warm,dry. PSYCH: Flat affect with mild psychomotor retardation    Assessment & Plan:     ICD-10-CM   1. 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 Tobacco dependence due to cigarettes  F17.210    Very heavy cigarette smoker Reports 3 packs a day Needs to quit smoking Counseled extensively     Meds ordered this encounter  Medications   Fluticasone-Umeclidin-Vilant (TRELEGY ELLIPTA) 100-62.5-25 MCG/INH AEPB    Sig: Inhale 1 puff into the lungs daily.    Dispense:  28 each    Refill:  11   Patient notes improvement on her dyspnea with Trelegy Ellipta.  Still some symptoms persist.  Suspect that these will improve with weight loss and conditioning program.  She desperately needs to quit smoking.  She was counseled however it is difficult to determine what level of insight she has as to cause and effect.  Other issues with dyspnea may be related to the multiple psychiatric medications she is on.  PFTs were relatively benign.  Continue Trelegy for now.  We will see the patient in follow-up in 4 months time she is to contact us prior to that time should any new difficulties arise.  Renold Don, MD [facility]   *This note was dictated using voice recognition software/Dragon.  Despite best efforts to proofread, errors can occur which can change the meaning.  Any change was purely unintentional.

## 2020-07-31 NOTE — Telephone Encounter (Signed)
[facility].  Covered alternatives are Advair, Anoro, Dulera, Flovent, Spiriva handihaler, Spiriva Respimat and stiolto.  Dr. Patsey Berthold, please advise. Thanks

## 2020-08-27 ENCOUNTER — Ambulatory Visit: Payer: Medicaid Other | Admitting: Adult Health

## 2020-09-03 ENCOUNTER — Other Ambulatory Visit: Payer: Self-pay | Admitting: Adult Health

## 2020-09-19 ENCOUNTER — Emergency Department
Admission: EM | Admit: 2020-09-19 | Discharge: 2020-09-19 | Disposition: A | Discharge status: Home / Self Care | Payer: Medicaid Other | Attending: Emergency Medicine | Admitting: Emergency Medicine

## 2020-09-19 ENCOUNTER — Emergency Department: Payer: Medicaid Other

## 2020-09-19 ENCOUNTER — Ambulatory Visit: Payer: Self-pay | Admitting: *Deleted

## 2020-09-19 ENCOUNTER — Other Ambulatory Visit: Payer: Self-pay

## 2020-09-19 ENCOUNTER — Encounter: Payer: Self-pay | Admitting: Emergency Medicine

## 2020-09-19 DIAGNOSIS — R42 Dizziness and giddiness: Secondary | ICD-10-CM | POA: Diagnosis not present

## 2020-09-19 DIAGNOSIS — J441 Chronic obstructive pulmonary disease with (acute) exacerbation: Secondary | ICD-10-CM | POA: Insufficient documentation

## 2020-09-19 DIAGNOSIS — R55 Syncope and collapse: Secondary | ICD-10-CM | POA: Insufficient documentation

## 2020-09-19 DIAGNOSIS — Z7984 Long term (current) use of oral hypoglycemic drugs: Secondary | ICD-10-CM | POA: Diagnosis not present

## 2020-09-19 DIAGNOSIS — Z7951 Long term (current) use of inhaled steroids: Secondary | ICD-10-CM | POA: Insufficient documentation

## 2020-09-19 DIAGNOSIS — E119 Type 2 diabetes mellitus without complications: Secondary | ICD-10-CM | POA: Insufficient documentation

## 2020-09-19 DIAGNOSIS — F17210 Nicotine dependence, cigarettes, uncomplicated: Secondary | ICD-10-CM | POA: Insufficient documentation

## 2020-09-19 DIAGNOSIS — Z7982 Long term (current) use of aspirin: Secondary | ICD-10-CM | POA: Diagnosis not present

## 2020-09-19 LAB — BASIC METABOLIC PANEL
Anion gap: 11 (ref 5–15)
BUN: 16 mg/dL (ref 6–20)
CO2: 25 mmol/L (ref 22–32)
Calcium: 9.7 mg/dL (ref 8.9–10.3)
Chloride: 100 mmol/L (ref 98–111)
Creatinine, Ser: 0.84 mg/dL (ref 0.44–1.00)
GFR, Estimated: >60 mL/min (ref >60)
Glucose, Bld: 91 mg/dL (ref 70–99)
Potassium: 3.8 mmol/L (ref 3.5–5.1)
Sodium: 136 mmol/L (ref 135–145)

## 2020-09-19 LAB — CBC
HCT: 41.6 % (ref 36.0–46.0)
Hemoglobin: 14.4 g/dL (ref 12.0–15.0)
MCH: 29.2 pg (ref 26.0–34.0)
MCHC: 34.6 g/dL (ref 30.0–36.0)
MCV: 84.4 fL (ref 80.0–100.0)
Platelets: 314 10*3/uL (ref 150–400)
RBC: 4.93 MIL/uL (ref 3.87–5.11)
RDW: 12.5 % (ref 11.5–15.5)
WBC: 8.1 10*3/uL (ref 4.0–10.5)
nRBC: 0 % (ref 0.0–0.2)

## 2020-09-19 LAB — CBG MONITORING, ED: Glucose-Capillary: 81 mg/dL (ref 70–99)

## 2020-09-19 NOTE — Telephone Encounter (Signed)
Reason for Disposition  SEVERE dizziness (e.g., unable to stand, requires support to walk, feels like passing out now)    Very dizzy can't walk and is passing out falling to the floor.  Answer Assessment - Initial Assessment Questions 1. DESCRIPTION: "Describe your dizziness."     Becky Hubbard calling in regarding Becky Hubbard her daughter.   [place] used to see Laverna Peace at [facility].   Sharyn Lull is now at [employer].   Sharyn Lull is on medical leave at [place] now.   Since Winogene has not been seen by Sharyn Lull at [place] she is still considered a pt with [facility].   So [place] referred them back to [facility].   That's why Becky is calling [employer].    Plus [place] does not take Medicaid per Becky.   She is upset that [place] wasn't told that before following Sharyn Lull to [place].   [name] is very dizzy.   She is on several medications for mental health.   She is passing out and collapsing to the floor.   "I don't know what to do with her".  2. LIGHTHEADED: "Do you feel lightheaded?" (e.g., somewhat faint, woozy, weak upon standing)     Yes she is very dizzy.   Can't hardly walk and is passing out on me. 3. VERTIGO: "Do you feel like either you or the room is spinning or tilting?" (i.e. vertigo)     No 4. SEVERITY: "How bad is it?"  "Do you feel like you are going to faint?" "Can you stand and walk?"   - MILD: Feels slightly dizzy, but walking normally.   - MODERATE: Feels unsteady when walking, but not falling; interferes with normal activities (e.g., school, work).   - SEVERE: Unable to walk without falling, or requires assistance to walk without falling; feels like passing out now.      Severe Becky was requesting someone at [facility] to see Becky Hubbard however there is no one with openings at [facility] to see Becky Hubbard.   Plus I told Becky that Becky Hubbard needs to be evaluated in the ED since she is that dizzy and passing out.  At first  [facility] did not want to take her to the ED.   "I doubt she will go".   I let Becky know that since she was passing out and that dizzy they would not see her in the office anyway.    She needs to be taken to the ED either calling 911 or taking her.   Becky was agreeable to taking her at that point.    "If she will let me take her".    "She's got to do something".   "She can't keep passing out and being dizzy like this".    5. ONSET:  "When did the dizziness begin?"     *No Answer* 6. AGGRAVATING FACTORS: "Does anything make it worse?" (e.g., standing, change in head position)     *No Answer* 7. HEART RATE: "Can you tell me your heart rate?" "How many beats in 15 seconds?"  (Note: not all patients can do this)       *No Answer* 8. CAUSE: "What do you think is causing the dizziness?"     Per [name] is on a lot of psych. Medications from her psychiatrist and she's wondering if it's those medications but not really sure why Becky Hubbard is dizzy and passing out. 9. RECURRENT SYMPTOM: "Have you had dizziness before?" If Yes, ask: "When  was the last time?" "What happened that time?"     *No Answer* 10. OTHER SYMPTOMS: "Do you have any other symptoms?" (e.g., fever, chest pain, vomiting, diarrhea, bleeding)       *No Answer* 11. PREGNANCY: "Is there any chance you are pregnant?" "When was your last menstrual period?"       *No Answer*  Protocols used: Dizziness - Lightheadedness-A-AH

## 2020-09-19 NOTE — Telephone Encounter (Signed)
Noted  

## 2020-09-19 NOTE — Telephone Encounter (Signed)
Bradly Chris, mother called in because Becky Hubbard is very dizzy and passing out, collapsing to the floor.   "I don't know what to do with her".   See triage notes for details.  I instructed her to take [name] to the ED or call 911.  [name] really wanted her to be seen in the doctor's office however I told her with being so dizzy she can't walk and passing out that she needs to go to the ED.   The office isn't set up to handle this kind of emergency.  [name] agreed to taking her to the ED "If she can talk [facility] into letting her take her".     See notes for details.   I am forwarding these notes to [facility] even though Laverna Peace is no longer with the practice.   Pt hasn't been seen at [employer] where Laverna Peace is now working plus Sharyn Lull is out on medical leave.   [place] referred Marietou back to [facility] since [place] has not seen Niema at the [employer].

## 2020-09-19 NOTE — ED Triage Notes (Signed)
Pt comes into the ED via POV c/o syncopal episode today when she was walking from one room to the other.  Pt is diabetic but CBG was 132.  Pt denies hitting her head and episode was witnessed.  PT denies any blood thinner.  PT c/o bilateral hip pain from the fall.

## 2020-09-19 NOTE — ED Provider Notes (Signed)
[facility] Emergency Department Provider Note   ____________________________________________   Event Date/Time   First MD Initiated Contact with Patient 09/19/20 1633     (approximate)  I have reviewed the triage vital signs and the nursing notes.   HISTORY  Chief Complaint Loss of Consciousness    HPI Becky Hubbard is a 50 y.o. female who presents for syncope  LOCATION: Generalized DURATION: 2 days prior to arrival TIMING: Stable since onset and loss of consciousness lasting around 10 seconds SEVERITY: Moderate QUALITY: Lightheadedness CONTEXT: Patient states she has had multiple medication changes recently and over the last 2 days has had intermittent lightheadedness where she has had to drop to the floor in order to recover MODIFYING FACTORS: Standing worsens the symptoms and laying flat relieves them ASSOCIATED SYMPTOMS: "Shakiness"   Per medical record review, patient has extensive past medical/psychiatric history including, ADD, diabetes, depression, fibromyalgia, chronic fatigue syndrome, OCD, PTSD          Past Medical History:  Diagnosis Date   ADD (attention deficit disorder)    Anxiety    Arthritis    Auditory hallucinations    Carpal tunnel syndrome on both sides    Cervical spine fracture ([place]) 2008   Closed with screw C2-3 [facility]. Delilah Shan   Degenerative joint disease of spine    Depression    Diabetes mellitus without complication ([place])    Family history of adverse reaction to anesthesia    mom- slow to awaken   Fibromyalgia    Foot fracture, left    Gastroparesis    Gastroparesis    GERD (gastroesophageal reflux disease)    Hearing loss    Herniated cervical disc    History of prescription drug abuse    Neuromuscular disorder ([place])    fibromyalgia/chronic fatique syndrome   Neuropathy of foot    OCD (obsessive compulsive disorder)    Other specified disorder of skin    neurological skin disorder-breaks out when  nervous   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   NOT seen on exam 04/10/14   Plaque psoriasis    PTSD (post-traumatic stress disorder)    Stroke [facility])    Ulcer     Patient Active Problem List   Diagnosis Date Noted   B12 deficiency 04/29/2020   Neuropathy of both feet 01/08/2020   Major depressive disorder, recurrent episode, severe, with psychosis ([place]) 01/07/2020   Type 2 diabetes mellitus without complication, without long-term current use of insulin ([place]) 11/01/2019   Other hyperlipidemia 11/01/2019   Gastroesophageal reflux disease 05/05/2019   History of psoriasis 11/03/2018   Acute delirium 07/05/2017   CAP (community acquired pneumonia) 07/04/2017   Tachycardia 07/04/2017   Acute encephalopathy 07/04/2017   Chronic allergic rhinitis due to pollen 03/09/2016   Deviated nasal septum 03/09/2016   Tobacco use 03/03/2016   COPD exacerbation ([place]) 02/11/2016   Obsessive-compulsive disorder 01/17/2015   Borderline diabetes 09/04/2014   Depression, major, recurrent, moderate ([place]) 08/21/2014   ADD (attention deficit disorder) 07/31/2014   Dermatitis, eczematoid 07/31/2014   Allergy to environmental factors 07/31/2014   Disorder of nervous system 07/31/2014   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   Plaque psoriasis 07/31/2014   Cicatrix 07/31/2014   Vitamin D deficiency 07/31/2014   Colon cancer screening 07/31/2014   Loss of feeling or sensation 06/04/2014   Herniated cervical disc    PTSD (post-traumatic stress disorder)    Nonspecific abnormal results of endocrine function study 04/19/2014   Carpal tunnel syndrome  10/03/2012   Chest pain of uncertain etiology [id number]   Acid indigestion 07/21/2012   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 Authorizing Provider  aspirin EC 81 MG EC tablet Take 1 tablet (81 mg total) by mouth daily. Swallow whole. 01/19/20   Clapacs, Madie Reno, MD  buPROPion (WELLBUTRIN XL) 150 MG 24 hr tablet Take 150 mg by mouth every morning. 04/05/20   [provider]  DULoxetine (CYMBALTA) 60 MG capsule Take 1 capsule (60 mg total) by mouth daily. 01/19/20   Clapacs, Madie Reno, MD  EPINEPHrine (EPIPEN 2-PAK) 0.3 mg/0.3 mL IJ SOAJ injection Inject 0.3 mg into the muscle as needed for anaphylaxis. 07/15/20   Harvest Dark, MD  EPINEPHrine 0.3 mg/0.3 mL IJ SOAJ injection Inject 0.3 mg into the muscle as needed for anaphylaxis. 11/22/19   Blake Divine, MD  fluticasone (FLONASE) 50 MCG/ACT nasal spray Place 2 sprays into both nostrils daily. 10/31/19   [provider]  fluticasone-salmeterol (ADVAIR DISKUS) 250-50 MCG/ACT AEPB Inhale 1 puff into the lungs in the morning and at bedtime. 07/31/20   Tyler Pita, MD  haloperidol (HALDOL) 5 MG tablet Take 1 tablet (5 mg total) by mouth 2 (two) times daily. 01/18/20   Clapacs, Madie Reno, MD  hydrOXYzine (ATARAX/VISTARIL) 50 MG tablet TAKE ONE TABLET BY MOUTH 3 TIMES DAILY AS NEEDED (DOSAGE INCREASE) 04/20/20   [provider]  lisdexamfetamine (VYVANSE) 40 MG capsule Take 1 capsule (40 mg total) by mouth daily. 01/19/20   Clapacs, Madie Reno, MD  metFORMIN (GLUCOPHAGE) 500 MG tablet TAKE 1 TABLET BY MOUTH DAILY WITH FOOD. 09/05/20   Virginia Crews, MD  pantoprazole (PROTONIX) 40 MG tablet TAKE ONE TABLET EVERY DAY 09/04/16   Wynetta Emery, Megan P, DO  PROAIR HFA 108 [phone] Base) MCG/ACT inhaler  Inhale 2 puffs into the lungs every 6 (six) hours as needed for wheezing or shortness of breath. 10/02/19   [provider]  tiotropium (SPIRIVA HANDIHALER) 18 MCG inhalation capsule Place 1 capsule (18 mcg total) into inhaler and inhale daily. 07/31/20 07/31/21  Tyler Pita, MD  Vitamin D, Ergocalciferol, (DRISDOL) 1.25 MG (50000 UNIT) CAPS capsule Take 1 capsule (50,000 Units total) by mouth every 7 (seven) days. (taking one tablet per week) walk in lab in office 1-2 weeks after completing prescription. 04/26/20   Flinchum, Kelby Aline, FNP  ziprasidone (GEODON) 60 MG capsule TAKE 1 CAPSULE BY MOUTH EVERY EVENING AFTER MEAL 04/09/20   [provider]    Allergies Doxycycline, Lyrica [pregabalin], Neurontin [gabapentin], Chantix [varenicline], Prednisone, Other, Penicillin g, Penicillins, and Sulfa antibiotics  Family History  Problem Relation Age of Onset   Cancer Mother 69       breast cancer   Migraines Mother    Hypertension Mother    Arthritis Mother  Sleep apnea Mother    Anxiety disorder Mother    Depression Mother    Breast cancer Mother    Diabetes Father    Cancer Father 60       of spine , pancreas and liver   Anxiety disorder Father    Depression Father    Diabetes Brother    Hyperlipidemia Brother    Sleep apnea Brother    Cancer Maternal Grandfather        leukemia, prostate   Cancer Paternal Grandfather        bone   Cancer Maternal Aunt        breast   Mental illness Maternal Grandmother        Dementia   Stroke Paternal Grandmother    Diabetes Brother    Cancer Paternal Aunt        breast    Social History Social History   Tobacco Use   Smoking status: Every Day    Packs/day: 5.00    Years: 20.00    Pack years: 100.00    Types: Cigarettes    Start date: 03/01/2011   Smokeless tobacco: Never   Tobacco comments:    3PPD 07/31/2020  Vaping Use   Vaping Use: Never used  Substance Use Topics   Alcohol use: No   Drug use: No     Review of Systems Constitutional: No fever/chills.  Endorses intermittent lightheadedness Eyes: No visual changes. ENT: No sore throat. Cardiovascular: Denies chest pain. Respiratory: Denies shortness of breath. Gastrointestinal: No abdominal pain.  No nausea, no vomiting.  No diarrhea. Genitourinary: Negative for dysuria. Musculoskeletal: Negative for acute arthralgias Skin: Negative for rash. Neurological: Negative for headaches, weakness/numbness/paresthesias in any extremity Psychiatric: Negative for suicidal ideation/homicidal ideation   ____________________________________________   PHYSICAL EXAM:  VITAL SIGNS: ED Triage Vitals  Enc Vitals Group     BP 09/19/20 1424 105/72     Pulse Rate 09/19/20 1424 100     Resp 09/19/20 1424 18     Temp 09/19/20 1424 98 F (36.7 C)     Temp src --      SpO2 09/19/20 1424 98 %     Weight 09/19/20 1321 172 lb (78 kg)     Height 09/19/20 1321 '5\' 6"'$  (1.676 m)     Head Circumference --      Peak Flow --      Pain Score 09/19/20 1321 4     Pain Loc --      Pain Edu? --      Excl. in [place]? --    Constitutional: Alert and oriented. Well appearing and in no acute distress. Eyes: Conjunctivae are normal. PERRL. Head: Atraumatic. Nose: No congestion/rhinnorhea. Mouth/Throat: Mucous membranes are moist. Neck: No stridor Cardiovascular: Grossly normal heart sounds.  Good peripheral circulation. Respiratory: Normal respiratory effort.  No retractions. Gastrointestinal: Soft and nontender. No distention. Musculoskeletal: No obvious deformities Neurologic:  Normal speech and language. No gross focal neurologic deficits are appreciated. Skin:  Skin is warm and dry. No rash noted. Psychiatric: Mood and affect are normal. Speech and behavior are normal.  ____________________________________________   LABS (all labs ordered are listed, but only abnormal results are displayed)  Labs Reviewed  BASIC METABOLIC PANEL  CBC  URINALYSIS,  COMPLETE (UACMP) WITH MICROSCOPIC  CBG MONITORING, ED  POC URINE PREG, ED   ____________________________________________  EKG  ED ECG REPORT I, Naaman Plummer, the attending physician, personally viewed and interpreted this ECG.  Date: 09/19/2020 EKG Time:  1323 Rate: 110 Rhythm: Tachycardic sinus rhythm QRS Axis: normal Intervals: normal ST/T Wave abnormalities: normal Narrative Interpretation: Sinus tachycardia.  No evidence of acute ischemia  ____________________________________________  RADIOLOGY  ED MD interpretation: 1 view x-ray of the pelvis shows no evidence of lesions, fractures, or diastases  Official radiology report(s): DG Pelvis Portable  Result Date: 09/19/2020 CLINICAL DATA:  Syncopal episode and fall with pelvic pain. EXAM: PORTABLE PELVIS 1-2 VIEWS COMPARISON:  02/20/2006 FINDINGS: There is no evidence of pelvic fracture or diastasis. No pelvic bone lesions are seen. IMPRESSION: Normal radiographs. Electronically Signed   By: Nelson Chimes M.D.   On: 09/19/2020 17:29    ____________________________________________   PROCEDURES  Procedure(s) performed (including Critical Care):  .1-3 Lead EKG Interpretation  Date/Time: 09/19/2020 6:30 PM Performed by: Naaman Plummer, MD Authorized by: Naaman Plummer, MD     Interpretation: normal     ECG rate:  97   ECG rate assessment: normal     Rhythm: sinus rhythm     Ectopy: none     Conduction: normal     ____________________________________________   INITIAL IMPRESSION / ASSESSMENT AND PLAN / ED COURSE  As part of my medical decision making, I reviewed the following data within the electronic medical record, if available:  Nursing notes reviewed and incorporated, Labs reviewed, EKG interpreted, Old chart reviewed, Radiograph reviewed and Notes from prior ED visits reviewed and incorporated      Patient presents with complaints of syncope/presyncope ED Workup:  CBC, BMP, Troponin, BNP, ECG,  CXR Differential diagnosis includes HF, ICH, seizure, stroke, HOCM, ACS, aortic dissection, malignant arrhythmia, or GI bleed. Findings: No evidence of acute laboratory abnormalities.  Troponin negative x1 EKG: No e/o STEMI. No evidence of Brugadas sign, delta wave, epsilon wave, significantly prolonged QTc, or malignant arrhythmia.  Disposition: Discharge. Patient is at baseline at this time. Return precautions expressed and understood in person. Advised follow up with primary care provider or clinic physician in next 24 hours.      ____________________________________________   FINAL CLINICAL IMPRESSION(S) / ED DIAGNOSES  Final diagnoses:  Syncope and collapse     ED Discharge Orders     None        Note:  This document was prepared using Dragon voice recognition software and may include unintentional dictation errors.    Naaman Plummer, MD 09/19/20 [phone]

## 2020-09-19 NOTE — ED Notes (Signed)
X-Ray at bedside.

## 2020-10-15 ENCOUNTER — Encounter: Payer: Self-pay | Admitting: Family Medicine

## 2020-10-15 ENCOUNTER — Ambulatory Visit (INDEPENDENT_AMBULATORY_CARE_PROVIDER_SITE_OTHER): Payer: Medicaid Other | Admitting: Family Medicine

## 2020-10-15 ENCOUNTER — Other Ambulatory Visit: Payer: Self-pay

## 2020-10-15 VITALS — BP 101/75 | HR 100 | Temp 99.2°F | Resp 15 | Wt 176.5 lb

## 2020-10-15 DIAGNOSIS — R55 Syncope and collapse: Secondary | ICD-10-CM | POA: Insufficient documentation

## 2020-10-15 DIAGNOSIS — R000 Tachycardia, unspecified: Secondary | ICD-10-CM | POA: Diagnosis not present

## 2020-10-15 DIAGNOSIS — F333 Major depressive disorder, recurrent, severe with psychotic symptoms: Secondary | ICD-10-CM

## 2020-10-15 DIAGNOSIS — E119 Type 2 diabetes mellitus without complications: Secondary | ICD-10-CM | POA: Diagnosis not present

## 2020-10-15 LAB — POCT GLYCOSYLATED HEMOGLOBIN (HGB A1C): Hemoglobin A1C: 5.8 % — AB (ref 4.0–5.6)

## 2020-10-15 NOTE — Assessment & Plan Note (Signed)
Mood remains stable Denies SI or HI

## 2020-10-15 NOTE — Assessment & Plan Note (Signed)
Borderline tachycardia Denies CP, SOB, DOE No lower extremity edema noted

## 2020-10-15 NOTE — Assessment & Plan Note (Signed)
Reports near daily occurrence Without warning Denies low BG Does not appear dehydrated BP remains stable Pt reports LOC for 10 secs at each incidence Denies 'falls' outside of syncope

## 2020-10-15 NOTE — Assessment & Plan Note (Signed)
A1c WDL; congratulated.  Continued diabetic diet and exercise program.

## 2020-10-15 NOTE — Progress Notes (Signed)
Established patient visit   Patient: Becky Hubbard   DOB: 1970/09/23   50 y.o. Female  MRN: [id number] Visit Date: 10/15/2020  Today's healthcare provider: [name], FNP   Chief Complaint  Patient presents with   Diabetes   Subjective  -------------------------------------------------------------------------------------------------------------------- HPI  Diabetes Mellitus Type II, Follow-up  Lab Results  Component Value Date   HGBA1C 5.8 (A) 10/15/2020   HGBA1C 5.8 (H) 04/25/2020   HGBA1C 5.7 (H) 01/10/2020   Wt Readings from Last 3 Encounters:  10/15/20 176 lb 8 oz (80.1 kg)  09/19/20 172 lb (78 kg)  07/31/20 172 lb 3.2 oz (78.1 kg)   Last seen for diabetes 5 months ago.  Management since then includes none. She reports excellent compliance with treatment. She is not having side effects.  Symptoms: No fatigue No foot ulcerations  No appetite changes No nausea  No paresthesia of the feet  No polydipsia  No polyuria No visual disturbances   No vomiting     Home blood sugar records: fasting range: 120s  Episodes of hypoglycemia? No    Current insulin regiment: none Most Recent Eye Exam: in the past 12 months Current exercise: no regular exercise Current diet habits: well balanced  Pertinent Labs: Lab Results  Component Value Date   CHOL 193 04/25/2020   HDL 34 (L) 04/25/2020   LDLCALC 128 (H) 04/25/2020   TRIG 175 (H) 04/25/2020   CHOLHDL 5.7 (H) 04/25/2020   Lab Results  Component Value Date   NA 136 09/19/2020   K 3.8 09/19/2020   CREATININE 0.84 09/19/2020   GFRNONAA >60 09/19/2020   GFRAA >60 07/05/2017   GLUCOSE 91 09/19/2020     ---------------------------------------------------------------------------------------------------    Medications: Outpatient Medications Prior to Visit  Medication Sig   aspirin EC 81 MG EC tablet Take 1 tablet (81 mg total) by mouth daily. Swallow whole.   buPROPion (WELLBUTRIN XL) 150 MG 24 hr tablet  Take 150 mg by mouth every morning.   DULoxetine (CYMBALTA) 60 MG capsule Take 1 capsule (60 mg total) by mouth daily.   EPINEPHrine (EPIPEN 2-PAK) 0.3 mg/0.3 mL IJ SOAJ injection Inject 0.3 mg into the muscle as needed for anaphylaxis.   EPINEPHrine 0.3 mg/0.3 mL IJ SOAJ injection Inject 0.3 mg into the muscle as needed for anaphylaxis.   fluticasone (FLONASE) 50 MCG/ACT nasal spray Place 2 sprays into both nostrils daily.   fluticasone-salmeterol (ADVAIR DISKUS) 250-50 MCG/ACT AEPB Inhale 1 puff into the lungs in the morning and at bedtime.   haloperidol (HALDOL) 5 MG tablet Take 1 tablet (5 mg total) by mouth 2 (two) times daily.   hydrOXYzine (ATARAX/VISTARIL) 50 MG tablet TAKE ONE TABLET BY MOUTH 3 TIMES DAILY AS NEEDED (DOSAGE INCREASE)   lisdexamfetamine (VYVANSE) 40 MG capsule Take 1 capsule (40 mg total) by mouth daily.   metFORMIN (GLUCOPHAGE) 500 MG tablet TAKE 1 TABLET BY MOUTH DAILY WITH FOOD.   pantoprazole (PROTONIX) 40 MG tablet TAKE ONE TABLET EVERY DAY   PROAIR HFA 108 (90 Base) MCG/ACT inhaler Inhale 2 puffs into the lungs every 6 (six) hours as needed for wheezing or shortness of breath.   tiotropium (SPIRIVA HANDIHALER) 18 MCG inhalation capsule Place 1 capsule (18 mcg total) into inhaler and inhale daily.   Vitamin D, Ergocalciferol, (DRISDOL) 1.25 MG (50000 UNIT) CAPS capsule Take 1 capsule (50,000 Units total) by mouth every 7 (seven) days. (taking one tablet per week) walk in lab in office 1-2 weeks  after completing prescription.   ziprasidone (GEODON) 60 MG capsule TAKE 1 CAPSULE BY MOUTH EVERY EVENING AFTER MEAL   No facility-administered medications prior to visit.    Review of Systems  {Labs  Heme  Chem  Endocrine  Serology  Results Review (optional):23779}   Objective  -------------------------------------------------------------------------------------------------------------------- BP 101/75   Pulse 100   Temp 99.2 F (37.3 C) (Oral)   Resp 15   Wt  176 lb 8 oz (80.1 kg)   LMP  (LMP Unknown)   BMI 28.49 kg/m  {Show previous vital signs (optional):23777}  Physical Exam Vitals and nursing note reviewed.  Constitutional:      General: She is not in acute distress.    Appearance: Normal appearance. She is well-developed, well-groomed and overweight. She is not ill-appearing, toxic-appearing or diaphoretic.  HENT:     Head: Normocephalic and atraumatic.  Cardiovascular:     Rate and Rhythm: Regular rhythm. Tachycardia present.     Pulses: Normal pulses.     Heart sounds: Normal heart sounds. No murmur heard.   No friction rub. No gallop.  Pulmonary:     Effort: Pulmonary effort is normal. No respiratory distress.     Breath sounds: Normal breath sounds. No stridor. No wheezing, rhonchi or rales.  Chest:     Chest wall: No tenderness.  Abdominal:     General: Bowel sounds are normal. There is no distension.     Palpations: Abdomen is soft. There is no mass.     Tenderness: There is no abdominal tenderness.  Musculoskeletal:        General: No swelling, tenderness, deformity or signs of injury. Normal range of motion.     Right lower leg: No edema.     Left lower leg: No edema.  Skin:    General: Skin is warm and dry.     Capillary Refill: Capillary refill takes less than 2 seconds.     Coloration: Skin is not jaundiced or pale.     Findings: No bruising, erythema, lesion or rash.  Neurological:     General: No focal deficit present.     Mental Status: She is oriented to person, place, and time. Mental status is at baseline. She is lethargic.     Cranial Nerves: No cranial nerve deficit.     Sensory: No sensory deficit.     Motor: No weakness.     Coordination: Coordination normal.     Comments: Reports daily syncope, lasting 10 seconds.  Which is why she was seen in the ER last month.  This is first return to PCP following ER visit.  Neuro referral placed; chronic psychological concerns as well as hx of CVA  Psychiatric:         Attention and Perception: Attention normal.        Mood and Affect: Mood normal. Affect is blunt and flat.        Speech: Speech is delayed.        Behavior: Behavior is slowed. Behavior is not withdrawn. Behavior is cooperative.        Thought Content: Thought content normal.        Cognition and Memory: Cognition normal.        Judgment: Judgment normal.     Comments: Patient is Alert and Oriented; however, all responses and interactions are slow and drawn out.   Patient answers all questions appropriately; however, responses are inconsistent throughout.  Patient repeats back exact words to provider and does not appear  to have original train of thought.    Results for orders placed or performed in visit on 10/15/20  POCT glycosylated hemoglobin (Hb A1C)  Result Value Ref Range   Hemoglobin A1C 5.8 (A) 4.0 - 5.6 %   HbA1c POC (<> result, manual entry)     HbA1c, POC (prediabetic range)     HbA1c, POC (controlled diabetic range)      Assessment & Plan  --------------------------------------------------------------------------------------------------------------------- Problem List Items Addressed This Visit       Endocrine   Type 2 diabetes mellitus without complication, without long-term current use of insulin (HCC) - Primary    A1c WDL; congratulated.  Continued diabetic diet and exercise program.      Relevant Orders   POCT glycosylated hemoglobin (Hb A1C) (Completed)     Other   Tachycardia    Borderline tachycardia Denies CP, SOB, DOE No lower extremity edema noted      Major depressive disorder, recurrent episode, severe, with psychosis ([place])    Mood remains stable Denies SI or HI      Syncope    Reports near daily occurrence Without warning Denies low BG Does not appear dehydrated BP remains stable Pt reports LOC for 10 secs at each incidence Denies 'falls' outside of syncope      Relevant Orders   Ambulatory referral to Neurology     Return in  about 3 months (around 01/14/2021) for chonic disease management.      Vonna Kotyk, FNP, have reviewed all documentation for this visit. The documentation on 10/15/20 for the exam, diagnosis, procedures, and orders are all accurate and complete.    [name], [place] [phone] (phone) [phone] (fax)  [place]

## 2020-10-16 ENCOUNTER — Encounter: Payer: Self-pay | Admitting: Neurology

## 2020-10-21 ENCOUNTER — Ambulatory Visit: Payer: Medicaid Other | Admitting: Adult Health

## 2020-10-30 ENCOUNTER — Telehealth: Payer: Self-pay

## 2020-10-30 NOTE — Telephone Encounter (Signed)
Copied from [place] [id number]. Topic: General - Call Back - No Documentation >> Oct 30, 2020 12:59 PM Erick Blinks wrote: Reason for CRM: Pt's legal guardian called and wants to come and retreive a copy of the patient's AVS. Britt Boozer)

## 2020-11-25 ENCOUNTER — Other Ambulatory Visit: Payer: Self-pay | Admitting: Family Medicine

## 2020-11-25 NOTE — Telephone Encounter (Signed)
Copied from [place] [phone]. Topic: Quick Communication - Rx Refill/Question >> Nov 25, 2020 10:26 AM Leward Quan A wrote: Medication: metFORMIN (GLUCOPHAGE) 500 MG tablet  90 day please  Has the patient contacted their pharmacy? No. Will call Pharmacy and request 90 day supply  (Agent: If no, request that the patient contact the pharmacy for the refill.) (Agent: If yes, when and what did the pharmacy advise?)  Preferred Pharmacy (with phone number or street name): [place], [place] - [address] [place]  Phone:  [phone] Fax:  [phone]    Has the patient been seen for an appointment in the last year OR does the patient have an upcoming appointment? Yes.    Agent: Please be advised that RX refills may take up to 3 business days. We ask that you follow-up with your pharmacy.

## 2020-11-25 NOTE — Telephone Encounter (Signed)
Requested medication (s) are due for refill today: Yes  Requested medication (s) are on the active medication list: Yes  Last refill:  11/25/20  Future visit scheduled: Yes  Notes to clinic:  pt wants 90 day refill      Requested Prescriptions  Pending Prescriptions Disp Refills   metFORMIN (GLUCOPHAGE) 500 MG tablet 30 tablet 1    Sig: Take 1 tablet (500 mg total) by mouth daily. with food     Endocrinology:  Diabetes - Biguanides Passed - 11/25/2020  4:02 PM      Passed - Cr in normal range and within 360 days    Creatinine, Ser  Date Value Ref Range Status  09/19/2020 0.84 0.44 - 1.00 mg/dL Final          Passed - HBA1C is between 0 and 7.9 and within 180 days    Hemoglobin A1C  Date Value Ref Range Status  10/15/2020 5.8 (A) 4.0 - 5.6 % Final   HB A1C (BAYER DCA - WAIVED)  Date Value Ref Range Status  07/04/2015 5.6 <7.0 % Final    Comment:                                          Diabetic Adult            <7.0                                       Healthy Adult        4.3 - 5.7                                                           (DCCT/NGSP) American Diabetes Association's Summary of Glycemic Recommendations for Adults with Diabetes: Hemoglobin A1c <7.0%. More stringent glycemic goals (A1c <6.0%) may further reduce complications at the cost of increased risk of hypoglycemia.    Hgb A1c MFr Bld  Date Value Ref Range Status  04/25/2020 5.8 (H) 4.8 - 5.6 % Final    Comment:             Prediabetes: 5.7 - 6.4          Diabetes: >6.4          Glycemic control for adults with diabetes: <7.0           Passed - eGFR in normal range and within 360 days    GFR calc Af Amer  Date Value Ref Range Status  07/05/2017 >60 >60 mL/min Final    Comment:    (NOTE) The eGFR has been calculated using the CKD EPI equation. This calculation has not been validated in all clinical situations. eGFR's persistently <60 mL/min signify possible Chronic Kidney Disease.     GFR, Estimated  Date Value Ref Range Status  09/19/2020 >60 >60 mL/min Final    Comment:    (NOTE) Calculated using the CKD-EPI Creatinine Equation (2021)    eGFR  Date Value Ref Range Status  04/25/2020 77 >59 mL/min/1.73 Final          Passed - Valid encounter within last 6 months  Recent Outpatient Visits           1 month ago Type 2 diabetes mellitus without complication, without long-term current use of insulin [facility])   [facility] Tally Joe T, FNP   7 months ago Type 2 diabetes mellitus without complication, without long-term current use of insulin [facility])   [facility] [place], Kelby Aline, FNP   4 years ago COPD exacerbation [facility])   [place], [place], DO   4 years ago [place], DO   4 years ago Pain of right hip joint   [facility] Valerie Roys, DO       Future Appointments             In 1 month Pieter Partridge, [facility] [place]   In 1 month [name], [place], [place]

## 2020-11-28 ENCOUNTER — Telehealth: Payer: Self-pay

## 2020-11-28 ENCOUNTER — Other Ambulatory Visit: Payer: Self-pay | Admitting: Family Medicine

## 2020-11-28 DIAGNOSIS — R051 Acute cough: Secondary | ICD-10-CM

## 2020-11-28 MED ORDER — ALBUTEROL SULFATE HFA 108 (90 BASE) MCG/ACT IN AERS: 2.0000 | INHALATION_SPRAY | Freq: Four times a day (QID) | RESPIRATORY_TRACT | 0 refills | Status: DC | PRN

## 2020-11-28 NOTE — Telephone Encounter (Signed)
Copied from [place] [phone]. Topic: Quick Communication - Rx Refill/Question >> Nov 28, 2020  3:56 PM [place], Brayton Layman A wrote: [facility] called in stating the PROAIR HFA 108 (90 Base) MCG/ACT inhaler has been discontinued and they cannot refill this, please advise if an alternate option can be sent in.

## 2020-12-24 ENCOUNTER — Other Ambulatory Visit: Payer: Self-pay | Admitting: Family Medicine

## 2021-01-10 ENCOUNTER — Other Ambulatory Visit: Payer: Self-pay

## 2021-01-10 ENCOUNTER — Encounter: Payer: Self-pay | Admitting: Neurology

## 2021-01-10 ENCOUNTER — Ambulatory Visit: Payer: Medicaid Other | Admitting: Neurology

## 2021-01-10 VITALS — BP 131/86 | HR 95 | Ht 65.0 in | Wt 193.6 lb

## 2021-01-10 DIAGNOSIS — D352 Benign neoplasm of pituitary gland: Secondary | ICD-10-CM

## 2021-01-10 DIAGNOSIS — R55 Syncope and collapse: Secondary | ICD-10-CM

## 2021-01-10 DIAGNOSIS — R002 Palpitations: Secondary | ICD-10-CM | POA: Diagnosis not present

## 2021-01-10 NOTE — Patient Instructions (Signed)
Check MRI of brain and pituitary gland with and without contrast Routine EEG Have your PCP consider cardiac evaluation Further recommendations pending results.

## 2021-01-10 NOTE — Progress Notes (Addendum)
NEUROLOGY CONSULTATION NOTE  Becky Hubbard MRN: [id number] DOB: 27-Sep-1970  Referring provider: Tally Joe, FNP Primary care provider: Tally Joe, FNP  Reason for consult:  syncope  Assessment/Plan:   Syncope and collapse  Palpitations Pituitary microadenoma  1  Check MRI brain/pituitary with and without contrast 2  Routine EEG 3  Due to palpitations, PCP may want to consider cardiology evaluation. 4  Further recommendations pending results.  02/03/2021 ADDENDUM:  EEG normal.  MRI brain/pituitary showed no evidence of pituitary adenoma or other acute intracranial abnormality.  No further neurologic workup warranted.   Becky Clines, DO   Subjective:  Becky Hubbard is a 50 year old female with ADD, depression, anxiety, DM II, fibromyalgia who presents for syncope.  History supplemented by referring provider's note.  She is accompanied by her mother who supplements history.  During the summer, she began exhibiting syncopal spells.  She will be walking and suddenly lose consciousness.  No preceding warning such as lightheadedness, tunnel vision, diaphoresis. She would fall.  Lasts a couple of seconds and wakes up.  No convulsions, incontinence or postictal confusion.  It was occurring almost daily.  She went to the ED in August where EKG was unremarkable.  Sometimes experiences palpitations.  Denies low blood glucose.  Stable blood pressure.  Her mother started giving her iron supplement (although no anemia was noted on CBC in August).  She hasn't had a spell for the past couple of months.  She takes multiple medications for depression and anxiety.    Remote brain MRI from 03/01/2013 reported possible pituitary microadenoma (less than 1 cm).  At the time, she reportedly was evaluated by endocrinology.  No follow up imaging since then.  She thinks that she had a TIA at that time, because she was experiencing numbness and tingling.  She still endorses right sided facial numbness.  PAST  MEDICAL HISTORY: Past Medical History:  Diagnosis Date   ADD (attention deficit disorder)    Anxiety    Arthritis    Auditory hallucinations    Carpal tunnel syndrome on both sides    Cervical spine fracture ([place]) 2008   Closed with screw C2-3 [facility]. Delilah Shan   Degenerative joint disease of spine    Depression    Diabetes mellitus without complication ([place])    Family history of adverse reaction to anesthesia    mom- slow to awaken   Fibromyalgia    Foot fracture, left    Gastroparesis    Gastroparesis    GERD (gastroesophageal reflux disease)    Hearing loss    Herniated cervical disc    History of prescription drug abuse    Neuromuscular disorder (HCC)    fibromyalgia/chronic fatique syndrome   Neuropathy of foot    OCD (obsessive compulsive disorder)    Other specified disorder of skin    neurological skin disorder-breaks out when nervous   Pituitary mass ([place]) MRI 2015   NOT seen on exam 04/10/14   Plaque psoriasis    PTSD (post-traumatic stress disorder)    Stroke ([employer])    Ulcer     PAST SURGICAL HISTORY: Past Surgical History:  Procedure Laterality Date   CARPAL TUNNEL RELEASE Left 07/12/2015   Procedure: OPEN CARPAL TUNNEL RELEASE OF LEFT HAND;  Surgeon: Leanor Kail, MD;  Location: [place];  Service: Orthopedics;  Laterality: Left;   cervical radiofrequency neurotomy     FRACTURE SURGERY     MOUTH SURGERY     upper and  lower dentures   NASAL SEPTUM SURGERY     occipital nerve blocks     SPINE SURGERY     cervical    MEDICATIONS: Current Outpatient Medications on File Prior to Visit  Medication Sig Dispense Refill   albuterol (VENTOLIN HFA) 108 (90 Base) MCG/ACT inhaler Inhale 2 puffs into the lungs every 6 (six) hours as needed for wheezing or shortness of breath. 8 g 0   aspirin EC 81 MG EC tablet Take 1 tablet (81 mg total) by mouth daily. Swallow whole. 30 tablet 1   buPROPion (WELLBUTRIN XL) 150 MG 24 hr tablet Take 150 mg by mouth every  morning.     DULoxetine (CYMBALTA) 60 MG capsule Take 1 capsule (60 mg total) by mouth daily. 30 capsule 1   EPINEPHrine (EPIPEN 2-PAK) 0.3 mg/0.3 mL IJ SOAJ injection Inject 0.3 mg into the muscle as needed for anaphylaxis. 1 each 1   EPINEPHrine 0.3 mg/0.3 mL IJ SOAJ injection Inject 0.3 mg into the muscle as needed for anaphylaxis. 1 each 0   fluticasone (FLONASE) 50 MCG/ACT nasal spray Place 2 sprays into both nostrils daily.     fluticasone-salmeterol (ADVAIR DISKUS) 250-50 MCG/ACT AEPB Inhale 1 puff into the lungs in the morning and at bedtime. 60 each 6   haloperidol (HALDOL) 5 MG tablet Take 1 tablet (5 mg total) by mouth 2 (two) times daily. 60 tablet 1   hydrOXYzine (ATARAX/VISTARIL) 50 MG tablet TAKE ONE TABLET BY MOUTH 3 TIMES DAILY AS NEEDED (DOSAGE INCREASE)     lisdexamfetamine (VYVANSE) 40 MG capsule Take 1 capsule (40 mg total) by mouth daily. 30 capsule 0   metFORMIN (GLUCOPHAGE) 500 MG tablet TAKE 1 TABLET BY MOUTH DAILY WITH FOOD. 30 tablet 1   pantoprazole (PROTONIX) 40 MG tablet TAKE ONE TABLET EVERY DAY 30 tablet 3   tiotropium (SPIRIVA HANDIHALER) 18 MCG inhalation capsule Place 1 capsule (18 mcg total) into inhaler and inhale daily. 30 capsule 6   Vitamin D, Ergocalciferol, (DRISDOL) 1.25 MG (50000 UNIT) CAPS capsule Take 1 capsule (50,000 Units total) by mouth every 7 (seven) days. (taking one tablet per week) walk in lab in office 1-2 weeks after completing prescription. 12 capsule 0   ziprasidone (GEODON) 60 MG capsule TAKE 1 CAPSULE BY MOUTH EVERY EVENING AFTER MEAL     No current facility-administered medications on file prior to visit.    ALLERGIES: Allergies  Allergen Reactions   Doxycycline    Lyrica [Pregabalin] Swelling    Ankles swell and turn red   Neurontin [Gabapentin]    Chantix [Varenicline] Rash   Prednisone Other (See Comments)    Makes her crazy   Other Other (See Comments)    Cats - cough/sneezing   Penicillin G Hives   Penicillins Hives     Has patient had a PCN reaction causing immediate rash, facial/tongue/throat swelling, SOB or lightheadedness with hypotension: No Has patient had a PCN reaction causing severe rash involving mucus membranes or skin necrosis: No Has patient had a PCN reaction that required hospitalization No Has patient had a PCN reaction occurring within the last 10 years: No If all of the above answers are "NO", then may proceed with Cephalosporin use.    Sulfa Antibiotics Hives, Rash and Other (See Comments)    FAMILY HISTORY: Family History  Problem Relation Age of Onset   Cancer Mother 26       breast cancer   Migraines Mother    Hypertension Mother  Arthritis Mother    Sleep apnea Mother    Anxiety disorder Mother    Depression Mother    Breast cancer Mother    Diabetes Father    Cancer Father 32       of spine , pancreas and liver   Anxiety disorder Father    Depression Father    Diabetes Brother    Hyperlipidemia Brother    Sleep apnea Brother    Cancer Maternal Grandfather        leukemia, prostate   Cancer Paternal Grandfather        bone   Cancer Maternal Aunt        breast   Mental illness Maternal Grandmother        Dementia   Stroke Paternal Grandmother    Diabetes Brother    Cancer Paternal Aunt        breast    Objective:  Blood pressure 131/86, pulse 95, height 5\' 5"  (1.651 m), weight 193 lb 9.6 oz (87.8 kg), SpO2 95 %. General: No acute distress.  Patient appears well-groomed.  Flat affect. Head:  Normocephalic/atraumatic Eyes:  fundi examined but not visualized Neck: supple, no paraspinal tenderness, full range of motion Back: No paraspinal tenderness Heart: regular rate and rhythm Lungs: Clear to auscultation bilaterally. Vascular: No carotid bruits. Neurological Exam: Mental status: alert and oriented to person, place, and time, recent and remote memory intact, fund of knowledge intact, attention and concentration intact, speech fluent and not dysarthric,  language intact. Cranial nerves: CN I: not tested CN II: pupils equal, round and reactive to light, visual fields intact CN III, IV, VI:  full range of motion, no nystagmus, no ptosis CN V: reduced right V1-V3 sensation CN VII: upper and lower face symmetric CN VIII: hearing intact CN IX, X: gag intact, uvula midline CN XI: sternocleidomastoid and trapezius muscles intact CN XII: tongue midline Bulk & Tone: normal, no fasciculations. Motor:  muscle strength 5/5 throughout Sensation:  Pinprick, temperature and vibratory sensation intact. Deep Tendon Reflexes:  2+ throughout,  toes downgoing.   Finger to nose testing:  Without dysmetria.   Heel to shin:  Without dysmetria.   Gait:  Normal station and stride.  Romberg negative.    Thank you for allowing me to take part in the care of this patient.  Becky Clines, DO  CC: [name], FNP

## 2021-01-13 ENCOUNTER — Ambulatory Visit: Payer: Medicaid Other | Admitting: Pulmonary Disease

## 2021-01-14 ENCOUNTER — Other Ambulatory Visit: Payer: Self-pay

## 2021-01-14 ENCOUNTER — Encounter: Payer: Self-pay | Admitting: Family Medicine

## 2021-01-14 ENCOUNTER — Telehealth: Payer: Self-pay

## 2021-01-14 ENCOUNTER — Ambulatory Visit (INDEPENDENT_AMBULATORY_CARE_PROVIDER_SITE_OTHER): Payer: Medicaid Other | Admitting: Family Medicine

## 2021-01-14 VITALS — BP 119/87 | HR 111 | Resp 16 | Wt 183.9 lb

## 2021-01-14 DIAGNOSIS — R000 Tachycardia, unspecified: Secondary | ICD-10-CM | POA: Diagnosis not present

## 2021-01-14 DIAGNOSIS — F17200 Nicotine dependence, unspecified, uncomplicated: Secondary | ICD-10-CM

## 2021-01-14 DIAGNOSIS — E119 Type 2 diabetes mellitus without complications: Secondary | ICD-10-CM

## 2021-01-14 DIAGNOSIS — M25512 Pain in left shoulder: Secondary | ICD-10-CM | POA: Insufficient documentation

## 2021-01-14 DIAGNOSIS — Z716 Tobacco abuse counseling: Secondary | ICD-10-CM | POA: Insufficient documentation

## 2021-01-14 DIAGNOSIS — Z23 Encounter for immunization: Secondary | ICD-10-CM | POA: Insufficient documentation

## 2021-01-14 LAB — POCT GLYCOSYLATED HEMOGLOBIN (HGB A1C): Hemoglobin A1C: 5.5 % (ref 4.0–5.6)

## 2021-01-14 MED ORDER — METFORMIN HCL 500 MG PO TABS: 500.0000 mg | ORAL_TABLET | Freq: Every day | ORAL | 3 refills | Status: DC

## 2021-01-14 MED ORDER — MELOXICAM 15 MG PO TABS: 15.0000 mg | ORAL_TABLET | Freq: Every day | ORAL | 1 refills | Status: DC

## 2021-01-14 MED ORDER — NICOTINE 21 MG/24HR TD PT24: 21.0000 mg | MEDICATED_PATCH | Freq: Every day | TRANSDERMAL | 11 refills | Status: DC

## 2021-01-14 NOTE — Telephone Encounter (Signed)
Patient is scheduled for [facility] may 12th, at 7pm. No authroization required.

## 2021-01-14 NOTE — Assessment & Plan Note (Signed)
A1c remains stable; patient's mom asking to d/c metformin Metformin was initially started by psych provider; will ask their opinion Patient wishes to continue at this time

## 2021-01-14 NOTE — Progress Notes (Signed)
Established patient visit   Patient: Becky Hubbard   DOB: 1970-04-16   50 y.o. Female  MRN: [id number] Visit Date: 01/14/2021  Today's healthcare provider: [name], FNP   Chief Complaint  Patient presents with   Diabetes   Subjective    HPI  Diabetes Mellitus Type II, Follow-up  Lab Results  Component Value Date   HGBA1C 5.5 01/14/2021   HGBA1C 5.8 (A) 10/15/2020   HGBA1C 5.8 (H) 04/25/2020   Wt Readings from Last 3 Encounters:  01/14/21 183 lb 14.4 oz (83.4 kg)  01/10/21 193 lb 9.6 oz (87.8 kg)  10/15/20 176 lb 8 oz (80.1 kg)   Last seen for diabetes 3 months ago.  Management since then includes none. She reports excellent compliance with treatment. She is not having side effects.  Symptoms: Yes fatigue No foot ulcerations  No appetite changes No nausea  No paresthesia of the feet  No polydipsia  No polyuria No visual disturbances   No vomiting     Home blood sugar records:  not checked  Episodes of hypoglycemia? No     Current insulin regiment: n/a  Most Recent Eye Exam: UTD Current exercise: no regular exercise Current diet habits: well balanced  Pertinent Labs: Lab Results  Component Value Date   CHOL 193 04/25/2020   HDL 34 (L) 04/25/2020   LDLCALC 128 (H) 04/25/2020   TRIG 175 (H) 04/25/2020   CHOLHDL 5.7 (H) 04/25/2020   Lab Results  Component Value Date   NA 136 09/19/2020   K 3.8 09/19/2020   CREATININE 0.84 09/19/2020   GFRNONAA >60 09/19/2020   MICROALBUR 10 07/04/2015   LABMICR See below: 03/03/2016     ---------------------------------------------------------------------------------------------------   Medications: Outpatient Medications Prior to Visit  Medication Sig   albuterol (VENTOLIN HFA) 108 (90 Base) MCG/ACT inhaler Inhale 2 puffs into the lungs every 6 (six) hours as needed for wheezing or shortness of breath.   aspirin EC 81 MG EC tablet Take 1 tablet (81 mg total) by mouth daily. Swallow whole.   AUSTEDO 9 MG  TABS Take 1 tablet by mouth 2 (two) times daily.   buPROPion (WELLBUTRIN XL) 150 MG 24 hr tablet Take 150 mg by mouth every morning.   DULoxetine (CYMBALTA) 60 MG capsule Take 1 capsule (60 mg total) by mouth daily.   EPINEPHrine (EPIPEN 2-PAK) 0.3 mg/0.3 mL IJ SOAJ injection Inject 0.3 mg into the muscle as needed for anaphylaxis.   EPINEPHrine 0.3 mg/0.3 mL IJ SOAJ injection Inject 0.3 mg into the muscle as needed for anaphylaxis.   fluticasone (FLONASE) 50 MCG/ACT nasal spray Place 2 sprays into both nostrils daily.   fluticasone-salmeterol (ADVAIR DISKUS) 250-50 MCG/ACT AEPB Inhale 1 puff into the lungs in the morning and at bedtime.   haloperidol (HALDOL) 5 MG tablet Take 1 tablet (5 mg total) by mouth 2 (two) times daily.   hydrOXYzine (ATARAX/VISTARIL) 50 MG tablet TAKE ONE TABLET BY MOUTH 3 TIMES DAILY AS NEEDED (DOSAGE INCREASE)   lisdexamfetamine (VYVANSE) 40 MG capsule Take 1 capsule (40 mg total) by mouth daily.   LORazepam (ATIVAN) 1 MG tablet Take 1 mg by mouth 2 (two) times daily.   pantoprazole (PROTONIX) 40 MG tablet TAKE ONE TABLET EVERY DAY   tiotropium (SPIRIVA HANDIHALER) 18 MCG inhalation capsule Place 1 capsule (18 mcg total) into inhaler and inhale daily.   Vitamin D, Ergocalciferol, (DRISDOL) 1.25 MG (50000 UNIT) CAPS capsule Take 1 capsule (50,000 Units total) by  mouth every 7 (seven) days. (taking one tablet per week) walk in lab in office 1-2 weeks after completing prescription.   ziprasidone (GEODON) 80 MG capsule SMARTSIG:1 Tablet(s) By Mouth Every Evening   [DISCONTINUED] metFORMIN (GLUCOPHAGE) 500 MG tablet TAKE 1 TABLET BY MOUTH DAILY WITH FOOD.   [DISCONTINUED] ziprasidone (GEODON) 60 MG capsule TAKE 1 CAPSULE BY MOUTH EVERY EVENING AFTER MEAL   No facility-administered medications prior to visit.    Review of Systems     Objective    BP 119/87   Pulse (!) 111   Resp 16   Wt 183 lb 14.4 oz (83.4 kg)   SpO2 98%   BMI 30.60 kg/m    Physical  Exam Vitals and nursing note reviewed.  Constitutional:      General: She is not in acute distress.    Appearance: Normal appearance. She is obese. She is not ill-appearing, toxic-appearing or diaphoretic.  HENT:     Head: Normocephalic and atraumatic.  Cardiovascular:     Rate and Rhythm: Regular rhythm. Tachycardia present.     Pulses: Normal pulses.     Heart sounds: Normal heart sounds. No murmur heard.   No friction rub. No gallop.  Pulmonary:     Effort: Pulmonary effort is normal. No respiratory distress.     Breath sounds: Normal breath sounds. No stridor. No wheezing, rhonchi or rales.  Chest:     Chest wall: No tenderness.  Abdominal:     General: Bowel sounds are normal.     Palpations: Abdomen is soft.  Musculoskeletal:        General: No swelling, tenderness, deformity or signs of injury. Normal range of motion.     Right lower leg: No edema.     Left lower leg: No edema.  Skin:    General: Skin is warm and dry.     Capillary Refill: Capillary refill takes less than 2 seconds.     Coloration: Skin is not jaundiced or pale.     Findings: No bruising, erythema, lesion or rash.  Neurological:     General: No focal deficit present.     Mental Status: She is alert and oriented to person, place, and time. Mental status is at baseline.     Cranial Nerves: No cranial nerve deficit.     Sensory: No sensory deficit.     Motor: No weakness.     Coordination: Coordination normal.  Psychiatric:        Mood and Affect: Mood normal. Affect is blunt and flat.        Speech: Speech is delayed.        Behavior: Behavior is withdrawn.        Thought Content: Thought content normal.        Judgment: Judgment normal.      Results for orders placed or performed in visit on 01/14/21  POCT glycosylated hemoglobin (Hb A1C)  Result Value Ref Range   Hemoglobin A1C 5.5 4.0 - 5.6 %   HbA1c POC (<> result, manual entry)     HbA1c, POC (prediabetic range)     HbA1c, POC (controlled  diabetic range)      Assessment & Plan     Problem List Items Addressed This Visit       Endocrine   Type 2 diabetes mellitus without complication, without long-term current use of insulin (HCC) - Primary    A1c remains stable; patient's mom asking to d/c metformin Metformin was initially started by psych  provider; will ask their opinion Patient wishes to continue at this time      Relevant Medications   metFORMIN (GLUCOPHAGE) 500 MG tablet   Other Relevant Orders   POCT glycosylated hemoglobin (Hb A1C) (Completed)     Other   Tachycardia    HR elevated on vitals; denies contributory concern      Tobacco dependence    Smokes 1 ppd      Relevant Medications   nicotine (NICODERM CQ - DOSED IN MG/24 HOURS) 21 mg/24hr patch   Acute pain of left shoulder    Referral placed to ortho Recommend use of NSAID to decrease inflammation and exercises Continue to monitor      Relevant Orders   Ambulatory referral to Orthopedic Surgery   Flu vaccine need    Consent reviewed; received      Relevant Orders   Flu Vaccine QUAD 66mo+IM (Fluarix, Fluzone & Alfiuria Quad PF) (Completed)   Encounter for tobacco use cessation counseling    Patient seeking counsel in smoking cessation Wishing to start patch Advised that medication may not be covered by insurance      Relevant Medications   nicotine (NICODERM CQ - DOSED IN MG/24 HOURS) 21 mg/24hr patch     Return in about 3 months (around 04/14/2021) for chonic disease management, T2DM management.      Vonna Kotyk, FNP, have reviewed all documentation for this visit. The documentation on 01/14/21 for the exam, diagnosis, procedures, and orders are all accurate and complete.    [name], [place] [phone] (phone) [phone] (fax)  [place]

## 2021-01-14 NOTE — Assessment & Plan Note (Signed)
HR elevated on vitals; denies contributory concern

## 2021-01-14 NOTE — Assessment & Plan Note (Signed)
Referral placed to ortho Recommend use of NSAID to decrease inflammation and exercises Continue to monitor

## 2021-01-14 NOTE — Assessment & Plan Note (Signed)
Consent reviewed; received

## 2021-01-14 NOTE — Assessment & Plan Note (Signed)
Smokes 1 ppd

## 2021-01-14 NOTE — Assessment & Plan Note (Signed)
Patient seeking counsel in smoking cessation Wishing to start patch Advised that medication may not be covered by insurance

## 2021-01-15 ENCOUNTER — Ambulatory Visit (INDEPENDENT_AMBULATORY_CARE_PROVIDER_SITE_OTHER): Payer: Medicaid Other | Admitting: Pulmonary Disease

## 2021-01-15 ENCOUNTER — Encounter: Payer: Self-pay | Admitting: Pulmonary Disease

## 2021-01-15 VITALS — BP 126/82 | HR 100 | Temp 97.8°F | Ht 65.0 in | Wt 183.8 lb

## 2021-01-15 DIAGNOSIS — J449 Chronic obstructive pulmonary disease, unspecified: Secondary | ICD-10-CM | POA: Diagnosis not present

## 2021-01-15 DIAGNOSIS — R0602 Shortness of breath: Secondary | ICD-10-CM

## 2021-01-15 DIAGNOSIS — R918 Other nonspecific abnormal finding of lung field: Secondary | ICD-10-CM

## 2021-01-15 DIAGNOSIS — F17210 Nicotine dependence, cigarettes, uncomplicated: Secondary | ICD-10-CM | POA: Diagnosis not present

## 2021-01-15 NOTE — Progress Notes (Signed)
Subjective:    Patient ID: Becky Hubbard, female    DOB: 03-13-1970, 50 y.o.   MRN: [id number] Chief Complaint  Patient presents with   Follow-up    HPI Patient is a 50 year old current smoker (1 PPD) who follows here for the issue of dyspnea.  This is a scheduled visit.  She was initially seen on 07 May 2020.  She never had her allergen panel or CBC performed.  She was then seen on 31 July 2020 and we requested that she go get her blood work done however she has not done so,she cannot explain why.  She is very challenging patient due to very limited interaction with the examiner. She has a very flat affect and is on multiple psychiatric medications.  At her initial visit she was noted to have bronchospasm and was started on Trelegy Ellipta.  She was using this medication and noted that it is helpful to her.  However, because of insurance coverage she had to be switched to Advair 250/51 inhalation twice a day and Spiriva HandiHaler 18 mcg 1 capsule inhaled daily.  Her dyspnea is markedly improved on the LABA ICS/LAMA combination.  She had pulmonary function testing that showed mild restriction likely on the basis of overweight.  Diffusion capacity was normal.  She did have small airways component with response to bronchodilator on the small airways.  This may indicate an asthmatic component. She resides with her mother who has multiple cats.  She may be allergic to cats however again did not have allergen panel performed.   Her initial visit we also reviewed a small lung nodules that were noted incidentally on a CT.  These are minute. The patient was transitioned over to the lung cancer screening program, she has not had her first scan yet and this will not be due until February 2023.   She does not endorse any other symptomatology today.  As noted she interacts very little with the examiner.  Overall she feels that she is doing "okay".   DATA: 11/15/2019 2D echo: LVEF 60 to 65%.  No wall motion  abnormality.  No valvular abnormality. 03/25/2020 CT chest: Mild subpleural scarring posterior medial right upper lobe.  Subpleural left lower lobe nodules 5 mm in size, no adenopathy no parenchymal abnormalities. 06/04/2020 PFTs: Mild restrictive physiology likely due to overweight, small airways component, diffusion capacity normal  Review of Systems A 10 point review of systems was performed and it is as noted above otherwise negative.  Patient Active Problem List   Diagnosis Date Noted   Tobacco dependence 01/14/2021   Acute pain of left shoulder 01/14/2021   Flu vaccine need 01/14/2021   Encounter for tobacco use cessation counseling 01/14/2021   Major depressive disorder, recurrent episode, severe, with psychosis ([place]) 01/07/2020   Type 2 diabetes mellitus without complication, without long-term current use of insulin ([place]) 11/01/2019   Tachycardia 07/04/2017   COPD exacerbation ([place]) 02/11/2016   Social History   Tobacco Use   Smoking status: Every Day    Packs/day: 2.00    Years: 20.00    Pack years: 40.00    Types: Cigarettes    Start date: 03/01/2011   Smokeless tobacco: Never   Tobacco comments:    1PPD 01/15/2021  Substance Use Topics   Alcohol use: No   Allergies  Allergen Reactions   Doxycycline    Lyrica [Pregabalin] Swelling    Ankles swell and turn red   Neurontin [Gabapentin]    Chantix [Varenicline]  Rash   Prednisone Other (See Comments)    Makes her crazy   Other Other (See Comments)    Cats - cough/sneezing   Penicillin G Hives   Penicillins Hives    Has patient had a PCN reaction causing immediate rash, facial/tongue/throat swelling, SOB or lightheadedness with hypotension: No Has patient had a PCN reaction causing severe rash involving mucus membranes or skin necrosis: No Has patient had a PCN reaction that required hospitalization No Has patient had a PCN reaction occurring within the last 10 years: No If all of the above answers are "NO", then  may proceed with Cephalosporin use.    Sulfa Antibiotics Hives, Rash and Other (See Comments)   Current Meds  Medication Sig   albuterol (VENTOLIN HFA) 108 (90 Base) MCG/ACT inhaler Inhale 2 puffs into the lungs every 6 (six) hours as needed for wheezing or shortness of breath.   aspirin EC 81 MG EC tablet Take 1 tablet (81 mg total) by mouth daily. Swallow whole.   AUSTEDO 9 MG TABS Take 1 tablet by mouth 2 (two) times daily.   buPROPion (WELLBUTRIN XL) 150 MG 24 hr tablet Take 150 mg by mouth every morning.   DULoxetine (CYMBALTA) 60 MG capsule Take 1 capsule (60 mg total) by mouth daily.   EPINEPHrine (EPIPEN 2-PAK) 0.3 mg/0.3 mL IJ SOAJ injection Inject 0.3 mg into the muscle as needed for anaphylaxis.   EPINEPHrine 0.3 mg/0.3 mL IJ SOAJ injection Inject 0.3 mg into the muscle as needed for anaphylaxis.   fluticasone (FLONASE) 50 MCG/ACT nasal spray Place 2 sprays into both nostrils daily.   fluticasone-salmeterol (ADVAIR DISKUS) 250-50 MCG/ACT AEPB Inhale 1 puff into the lungs in the morning and at bedtime.   haloperidol (HALDOL) 5 MG tablet Take 1 tablet (5 mg total) by mouth 2 (two) times daily.   hydrOXYzine (ATARAX/VISTARIL) 50 MG tablet TAKE ONE TABLET BY MOUTH 3 TIMES DAILY AS NEEDED (DOSAGE INCREASE)   lisdexamfetamine (VYVANSE) 40 MG capsule Take 1 capsule (40 mg total) by mouth daily.   LORazepam (ATIVAN) 1 MG tablet Take 1 mg by mouth 2 (two) times daily.   meloxicam (MOBIC) 15 MG tablet Take 1 tablet (15 mg total) by mouth daily.   metFORMIN (GLUCOPHAGE) 500 MG tablet Take 1 tablet (500 mg total) by mouth daily. with food   nicotine (NICODERM CQ - DOSED IN MG/24 HOURS) 21 mg/24hr patch Place 1 patch (21 mg total) onto the skin daily.   pantoprazole (PROTONIX) 40 MG tablet TAKE ONE TABLET EVERY DAY   tiotropium (SPIRIVA HANDIHALER) 18 MCG inhalation capsule Place 1 capsule (18 mcg total) into inhaler and inhale daily.   Vitamin D, Ergocalciferol, (DRISDOL) 1.25 MG (50000 UNIT)  CAPS capsule Take 1 capsule (50,000 Units total) by mouth every 7 (seven) days. (taking one tablet per week) walk in lab in office 1-2 weeks after completing prescription.   ziprasidone (GEODON) 80 MG capsule SMARTSIG:1 Tablet(s) By Mouth Every Evening   Immunization History  Administered Date(s) Administered   Influenza,inj,Quad PF,6+ Mos 01/14/2021   Influenza-Unspecified 04/09/2014, 11/04/2015, 10/25/2018   Pneumococcal Polysaccharide-23 12/19/2008   Tdap 07/31/2014       Objective:   Physical Exam BP 126/82 (BP Location: Left Arm, Cuff Size: Normal)   Pulse 100   Temp 97.8 F (36.6 C) (Temporal)   Ht 5\' 5"  (1.651 m)   Wt 183 lb 12.8 oz (83.4 kg)   SpO2 97%   BMI 30.59 kg/m  GENERAL: Well-developed overweight woman, no  acute distress.  Flat affect.  Fully ambulatory.  No conversational dyspnea. HEAD: Normocephalic, atraumatic.  EYES: Pupils equal, round, reactive to light.  No scleral icterus.  MOUTH: Nose/mouth/throat not examined due to masking requirements for COVID 19. NECK: Supple. No thyromegaly. Trachea midline. No JVD.  No adenopathy. PULMONARY: Good air entry bilaterally.  No adventitious sounds. CARDIOVASCULAR: S1 and S2. Regular rate and rhythm.  No rubs, murmurs or gallops heard. ABDOMEN: Benign. MUSCULOSKELETAL: No joint deformity, no clubbing, no edema.  NEUROLOGIC: Mild psychomotor retardation noted SKIN: Intact,warm,dry. 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 Shortness of breath  R06.02    Improved on current regimen     3. Lung nodules  R91.8    Patient has been transitioned to lung cancer screening program    4. Tobacco dependence due to cigarettes  F17.210 Ambulatory Referral for Lung Cancer Scre   Patient counseled extensively with regards discontinuation of smoking She has  decreased from 3 packs a day to 1 pack a day     Orders Placed This Encounter  Procedures   Ambulatory Referral for Lung Cancer Scre    Referral Priority:   Routine    Referral Type:   Consultation    Referral Reason:   Specialty Services Required    Referred to Provider:   Magdalen Spatz, NP    Number of Visits Requested:   1   Patient continues to do well on the current regimen.  She is making some effort as to discontinuing smoking.  Transition to the lung cancer screening program, this was done at her last visit however the program has transitioned over to pulmonary medicine and we will make sure that this occurs.  She is not due for a repeat CT until February 2023.  We will see her in follow-up in 6 months time she is to contact us prior to that time should any new difficulties arise.   Renold Don, MD Advanced Bronchoscopy PCCM [facility]    *This note was dictated using voice recognition software/Dragon.  Despite best efforts to proofread, errors can occur which can change the meaning. Any transcriptional errors that result from this process are unintentional and may not be fully corrected at the time of dictation.

## 2021-01-15 NOTE — Patient Instructions (Signed)
Continue using Advair and Spiriva as you are doing.  Use your albuterol as needed that is your rescue inhaler.  We will enroll you in the [facility] which will do CTs of the chest once a year.  We will see him in follow-up in 6 months time call sooner should any new problems arise.

## 2021-01-17 ENCOUNTER — Encounter: Payer: Self-pay | Admitting: Pulmonary Disease

## 2021-01-20 ENCOUNTER — Ambulatory Visit: Payer: Medicaid Other

## 2021-01-22 ENCOUNTER — Other Ambulatory Visit: Payer: Self-pay

## 2021-01-22 ENCOUNTER — Ambulatory Visit: Payer: Medicaid Other | Admitting: Neurology

## 2021-01-22 DIAGNOSIS — R55 Syncope and collapse: Secondary | ICD-10-CM | POA: Diagnosis not present

## 2021-01-23 NOTE — Procedures (Signed)
ELECTROENCEPHALOGRAM REPORT  Date of Study: 01/22/2021  Patient's Name: Becky Hubbard MRN: [id number] Date of Birth: 12/04/1970   Clinical History: 50 year old female with recurrent syncope.  Medications: VENTOLIN HFA 108 (90 Base) MCG/ACT inhaler aspirin EC 81 MG EC tablet WELLBUTRIN XL 150 MG 24 hr tablet EPIPEN 2-PAK 0.3 mg/0.3 mL IJ 6SOAJ injection FLONASE 50 MCG/ACT nasal spray ADVAIR DISKUS 250-50 MCG/ACT AEPB HALDOL 5 MG tablet ATARAX/VISTARIL 50 MG tablet SPIRIVA HANDIHALER 18 MCG inhalation capsule VYVANSE 40 MG capsule GLUCOPHAGE 500 MG tablet PROTONIX 40 MG tablet DRISDOL 1.25 MG (50000 UNIT) CAPS capsule GEODON 60 MG capsule  Technical Summary: A multichannel digital EEG recording measured by the international 10-20 system with electrodes applied with paste and impedances below 5000 ohms performed in our laboratory with EKG monitoring in an awake and drowsy patient.  Photic stimulation was performed.  The digital EEG was referentially recorded, reformatted, and digitally filtered in a variety of bipolar and referential montages for optimal display.    Description: The patient is awake and drowsy during the recording.  During maximal wakefulness, there is a symmetric, medium voltage 8 Hz posterior dominant rhythm that attenuates with eye opening.  The record is symmetric.  During drowsiness, there is an increase in theta slowing of the background.  Stage 2 sleep was not seen.  There were no epileptiform discharges or electrographic seizures seen.    EKG lead was unremarkable.  Impression: This awake and drowsy EEG is normal.    Clinical Correlation: A normal EEG does not exclude a clinical diagnosis of epilepsy.  If further clinical questions remain, prolonged EEG may be helpful.  Clinical correlation is advised.   Metta Clines, DO

## 2021-01-27 ENCOUNTER — Ambulatory Visit
Admission: RE | Admit: 2021-01-27 | Discharge: 2021-01-27 | Disposition: A | Discharge status: Home / Self Care | Payer: Medicaid Other | Source: Ambulatory Visit | Attending: Neurology | Admitting: Neurology

## 2021-01-27 ENCOUNTER — Other Ambulatory Visit: Payer: Self-pay

## 2021-01-27 DIAGNOSIS — D352 Benign neoplasm of pituitary gland: Secondary | ICD-10-CM | POA: Diagnosis present

## 2021-01-27 MED ORDER — GADOBUTROL 1 MMOL/ML IV SOLN
6.0000 mL | Freq: Once | INTRAVENOUS | Status: AC | PRN
  Administered 2021-01-27: 16:00:00 6 mL via INTRAVENOUS

## 2021-02-11 ENCOUNTER — Other Ambulatory Visit: Payer: Self-pay | Admitting: Family Medicine

## 2021-03-19 ENCOUNTER — Other Ambulatory Visit: Payer: Self-pay

## 2021-03-19 DIAGNOSIS — Z87891 Personal history of nicotine dependence: Secondary | ICD-10-CM

## 2021-03-19 DIAGNOSIS — F17210 Nicotine dependence, cigarettes, uncomplicated: Secondary | ICD-10-CM

## 2021-04-01 ENCOUNTER — Other Ambulatory Visit: Payer: Self-pay

## 2021-04-01 ENCOUNTER — Ambulatory Visit (INDEPENDENT_AMBULATORY_CARE_PROVIDER_SITE_OTHER): Payer: Medicaid Other | Admitting: Acute Care

## 2021-04-01 ENCOUNTER — Encounter: Payer: Self-pay | Admitting: Acute Care

## 2021-04-01 DIAGNOSIS — F17210 Nicotine dependence, cigarettes, uncomplicated: Secondary | ICD-10-CM | POA: Diagnosis not present

## 2021-04-01 NOTE — Patient Instructions (Signed)
Thank you for participating in the [facility] Scre[facility] Program. °It was our pleasure to meet you today. °We will call you with the results of your scan within the next few days. °Your scan will be assigned a Lung RADS category score by the physicians reading the scans.  °This Lung RADS score determines follow up scanning.  °See below for description of categories, and follow up screening recommendations. °We will be in touch to schedule your follow up screening annually or based on recommendations of our providers. °We will fax a copy of your scan results to your Primary Care Physician, or the physician who referred you to the program, to ensure they have the results. °Please call the office if you have any questions or concerns regarding your scanning experience or results.  °Our office number is [phone]. °Please speak with Denise Phelps, RN. She is our Lung Cancer Screening RN. °If she is unavailable when you call, please have the office staff send her a message. She will return your call at her earliest convenience. °Remember, if your scan is normal, we will scan you annually as long as you continue to meet the criteria for the program. (Age 55-77, Current smoker or smoker who has quit within the last 15 years). °If you are a smoker, remember, quitting is the single most powerful action that you can take to decrease your risk of lung cancer and other pulmonary, breathing related problems. °We know quitting is hard, and we are here to help.  °Please let us know if there is anything we can do to help you meet your goal of quitting. °If you are a former smoker, congratulations. We are proud of you! Remain smoke free! °Remember you can refer friends or family members through the number above.  °We will screen them to make sure they meet criteria for the program. °Thank you for helping us take better care of you by participating in Lung Screening. ° °You can receive free nicotine replacement therapy  ( patches, gum or mints) by calling 1-800-QUIT NOW. Please call so we can get you on the path to becoming  a non-smoker. I know it is hard, but you can do this! ° °Lung RADS Categories: ° °Lung RADS 1: no nodules or definitely non-concerning nodules.  °Recommendation is for a repeat annual scan in 12 months. ° °Lung RADS 2:  nodules that are non-concerning in appearance and behavior with a very low likelihood of becoming an active cancer. °Recommendation is for a repeat annual scan in 12 months. ° °Lung RADS 3: nodules that are probably non-concerning , includes nodules with a low likelihood of becoming an active cancer.  Recommendation is for a 6-month repeat screening scan. Often noted after an upper respiratory illness. We will be in touch to make sure you have no questions, and to schedule your 6-month scan. ° °Lung RADS 4 A: nodules with concerning findings, recommendation is most often for a follow up scan in 3 months or additional testing based on our provider's assessment of the scan. We will be in touch to make sure you have no questions and to schedule the recommended 3 month follow up scan. ° °Lung RADS 4 B:  indicates findings that are concerning. We will be in touch with you to schedule additional diagnostic testing based on our provider's  assessment of the scan. ° °Hypnosis for smoking cessation  °[employer] °[phone] ° °Acupuncture for smoking cessation  °[facility] °[phone]  °

## 2021-04-01 NOTE — Progress Notes (Signed)
Shared Decision Making Visit Lung Cancer Screening Program [phone])   Eligibility: Age 51 y.o. Pack Years Smoking History Calculation 49.5 pack year smoking history (# packs/per year x # years smoked) Recent History of coughing up blood  no Unexplained weight loss? no ( >Than 15 pounds within the last 6 months ) Prior History Lung / other cancer no (Diagnosis within the last 5 years already requiring surveillance chest CT Scans). Smoking Status Current Smoker Former Smokers: Years since quit:  NA  Quit Date:  NA  Visit Components: Discussion included one or more decision making aids. yes Discussion included risk/benefits of screening. yes Discussion included potential follow up diagnostic testing for abnormal scans. yes Discussion included meaning and risk of over diagnosis. yes Discussion included meaning and risk of False Positives. yes Discussion included meaning of total radiation exposure. yes  Counseling Included: Importance of adherence to annual lung cancer LDCT screening. yes Impact of comorbidities on ability to participate in the program. yes Ability and willingness to under diagnostic treatment. yes  Smoking Cessation Counseling: Current Smokers:  Discussed importance of smoking cessation. yes Information about tobacco cessation classes and interventions provided to patient. yes Patient provided with "ticket" for LDCT Scan. yes Symptomatic Patient. no  Counseling NA Diagnosis Code: Tobacco Use Z72.0 Asymptomatic Patient yes  Counseling (Intermediate counseling: > three minutes counseling) [id number] Former Smokers:  Discussed the importance of maintaining cigarette abstinence. yes Diagnosis Code: Personal History of Nicotine Dependence. Y63.785 Information about tobacco cessation classes and interventions provided to patient. Yes Patient provided with "ticket" for LDCT Scan. yes Written Order for Lung Cancer Screening with LDCT placed in Epic. Yes (CT Chest Lung  Cancer Screening Low Dose W/O CM) [id number] Z12.2-Screening of respiratory organs Z87.891-Personal history of nicotine dependence  I have spent 25 minutes of face to face/ virtual visit   time with  Becky Hubbard discussing the risks and benefits of lung cancer screening. We viewed / discussed a power point together that explained in detail the above noted topics. We paused at intervals to allow for questions to be asked and answered to ensure understanding.We discussed that the single most powerful action that she can take to decrease her risk of developing lung cancer is to quit smoking. We discussed whether or not she is ready to commit to setting a quit date. We discussed options for tools to aid in quitting smoking including nicotine replacement therapy, non-nicotine medications, support groups, Quit Smart classes, and behavior modification. We discussed that often times setting smaller, more achievable goals, such as eliminating 1 cigarette a day for a week and then 2 cigarettes a day for a week can be helpful in slowly decreasing the number of cigarettes smoked. This allows for a sense of accomplishment as well as providing a clinical benefit. I provided  her  with smoking cessation  information  with contact information for community resources, classes, free nicotine replacement therapy, and access to mobile apps, text messaging, and on-line smoking cessation help. I have also provided  her  the office contact information in the event she needs to contact me, or the screening staff. We discussed the time and location of the scan, and that either Becky Glassman RN, Becky Prince, RN  or I will call / send a letter with the results within 24-72 hours of receiving them. The patient verbalized understanding of all of  the above and had no further questions upon leaving the office. They have my contact information in the event they have any  further questions.  I spent 3 minutes counseling on smoking cessation and  the health risks of continued tobacco abuse.  I explained to the patient that there has been a high incidence of coronary artery disease noted on these exams. I explained that this is a non-gated exam therefore degree or severity cannot be determined. This patient is not on statin therapy. I have asked the patient to follow-up with their PCP regarding any incidental finding of coronary artery disease and management with diet or medication as their PCP  feels is clinically indicated. The patient verbalized understanding of the above and had no further questions upon completion of the visit.      Magdalen Spatz, NP 04/01/2021

## 2021-04-02 ENCOUNTER — Other Ambulatory Visit: Payer: Self-pay

## 2021-04-02 ENCOUNTER — Ambulatory Visit
Admission: RE | Admit: 2021-04-02 | Discharge: 2021-04-02 | Disposition: A | Discharge status: Home / Self Care | Payer: Medicaid Other | Source: Ambulatory Visit | Attending: Acute Care | Admitting: Acute Care

## 2021-04-02 DIAGNOSIS — F17210 Nicotine dependence, cigarettes, uncomplicated: Secondary | ICD-10-CM | POA: Diagnosis present

## 2021-04-02 DIAGNOSIS — Z87891 Personal history of nicotine dependence: Secondary | ICD-10-CM | POA: Insufficient documentation

## 2021-04-07 ENCOUNTER — Other Ambulatory Visit: Payer: Self-pay

## 2021-04-07 DIAGNOSIS — Z87891 Personal history of nicotine dependence: Secondary | ICD-10-CM

## 2021-04-07 DIAGNOSIS — F17210 Nicotine dependence, cigarettes, uncomplicated: Secondary | ICD-10-CM

## 2021-04-14 ENCOUNTER — Encounter: Payer: Self-pay | Admitting: Family Medicine

## 2021-04-14 ENCOUNTER — Ambulatory Visit: Payer: Medicaid Other | Admitting: Family Medicine

## 2021-04-14 ENCOUNTER — Other Ambulatory Visit: Payer: Self-pay

## 2021-04-14 VITALS — BP 154/76 | HR 85 | Temp 99.5°F | Wt 178.0 lb

## 2021-04-14 DIAGNOSIS — D352 Benign neoplasm of pituitary gland: Secondary | ICD-10-CM

## 2021-04-14 DIAGNOSIS — E1159 Type 2 diabetes mellitus with other circulatory complications: Secondary | ICD-10-CM | POA: Diagnosis not present

## 2021-04-14 DIAGNOSIS — E1149 Type 2 diabetes mellitus with other diabetic neurological complication: Secondary | ICD-10-CM

## 2021-04-14 DIAGNOSIS — E785 Hyperlipidemia, unspecified: Secondary | ICD-10-CM | POA: Insufficient documentation

## 2021-04-14 DIAGNOSIS — J449 Chronic obstructive pulmonary disease, unspecified: Secondary | ICD-10-CM

## 2021-04-14 DIAGNOSIS — I152 Hypertension secondary to endocrine disorders: Secondary | ICD-10-CM

## 2021-04-14 DIAGNOSIS — Z716 Tobacco abuse counseling: Secondary | ICD-10-CM

## 2021-04-14 DIAGNOSIS — E1169 Type 2 diabetes mellitus with other specified complication: Secondary | ICD-10-CM | POA: Insufficient documentation

## 2021-04-14 DIAGNOSIS — F17210 Nicotine dependence, cigarettes, uncomplicated: Secondary | ICD-10-CM | POA: Insufficient documentation

## 2021-04-14 NOTE — Assessment & Plan Note (Signed)
Re emphasize goal of LDL <70 ?Encourage exercise and diet control ?Not on statin d/t pill burden ?MI and stroke risk elevated ?

## 2021-04-14 NOTE — Assessment & Plan Note (Signed)
Chronic, continued use ?Now has lung nodules that need additional f/u seen on low CT scan ?Continue to emphasize reduce use with goal of cessation ?

## 2021-04-14 NOTE — Progress Notes (Signed)
[place],acting as a scribe for [name], FNP.,have documented all relevant documentation on the behalf of [name], FNP,as directed by  [name], FNP while in the presence of [name], FNP.   Established patient visit   Patient: Becky Hubbard   DOB: 21-Oct-1970   51 y.o. Female  MRN: [id number] Visit Date: 04/14/2021  Today's healthcare provider: [name], FNP  Pt and mom- reintroduced to nurse practitioner role and practice setting.  All questions answered.  Discussed provider/patient relationship and expectations.   No chief complaint on file.  Subjective    HPI  Diabetes Mellitus Type II, Follow-up  Lab Results  Component Value Date   HGBA1C 5.5 01/14/2021   HGBA1C 5.8 (A) 10/15/2020   HGBA1C 5.8 (H) 04/25/2020   Wt Readings from Last 3 Encounters:  04/14/21 178 lb (80.7 kg)  04/02/21 181 lb (82.1 kg)  01/15/21 183 lb 12.8 oz (83.4 kg)   Last seen for diabetes 3 months ago.  Management since then includes none. She reports good compliance with treatment. She is not having side effects.  Symptoms: No fatigue No foot ulcerations  No appetite changes No nausea  No paresthesia of the feet  No polydipsia  No polyuria No visual disturbances   No vomiting     Home blood sugar records:  not being checked  Episodes of hypoglycemia? No   Current insulin regiment: none Most Recent Eye Exam: Patient is due Current exercise: none Current diet habits: low sugar  Pertinent Labs: Lab Results  Component Value Date   CHOL 193 04/25/2020   HDL 34 (L) 04/25/2020   LDLCALC 128 (H) 04/25/2020   TRIG 175 (H) 04/25/2020   CHOLHDL 5.7 (H) 04/25/2020   Lab Results  Component Value Date   NA 136 09/19/2020   K 3.8 09/19/2020   CREATININE 0.84 09/19/2020   GFRNONAA >60 09/19/2020   MICROALBUR 10 07/04/2015   LABMICR See below: 03/03/2016      ---------------------------------------------------------------------------------------------------   Medications: Outpatient Medications Prior to Visit  Medication Sig   albuterol (VENTOLIN HFA) 108 (90 Base) MCG/ACT inhaler Inhale 2 puffs into the lungs every 6 (six) hours as needed for wheezing or shortness of breath.   aspirin EC 81 MG EC tablet Take 1 tablet (81 mg total) by mouth daily. Swallow whole.   AUSTEDO 9 MG TABS Take 1 tablet by mouth 2 (two) times daily.   buPROPion (WELLBUTRIN XL) 150 MG 24 hr tablet Take 150 mg by mouth every morning.   DULoxetine (CYMBALTA) 60 MG capsule Take 1 capsule (60 mg total) by mouth daily.   EPINEPHrine (EPIPEN 2-PAK) 0.3 mg/0.3 mL IJ SOAJ injection Inject 0.3 mg into the muscle as needed for anaphylaxis.   EPINEPHrine 0.3 mg/0.3 mL IJ SOAJ injection Inject 0.3 mg into the muscle as needed for anaphylaxis.   fluticasone (FLONASE) 50 MCG/ACT nasal spray Place 2 sprays into both nostrils daily.   fluticasone-salmeterol (ADVAIR DISKUS) 250-50 MCG/ACT AEPB Inhale 1 puff into the lungs in the morning and at bedtime.   haloperidol (HALDOL) 5 MG tablet Take 1 tablet (5 mg total) by mouth 2 (two) times daily.   hydrOXYzine (ATARAX/VISTARIL) 50 MG tablet TAKE ONE TABLET BY MOUTH 3 TIMES DAILY AS NEEDED (DOSAGE INCREASE)   lisdexamfetamine (VYVANSE) 40 MG capsule Take 1 capsule (40 mg total) by mouth daily.   LORazepam (ATIVAN) 1 MG tablet Take 1 mg by mouth 2 (two) times daily.  meloxicam (MOBIC) 15 MG tablet TAKE 1 TABLET BY MOUTH DAILY   metFORMIN (GLUCOPHAGE) 500 MG tablet Take 1 tablet (500 mg total) by mouth daily. with food   nicotine (NICODERM CQ - DOSED IN MG/24 HOURS) 21 mg/24hr patch Place 1 patch (21 mg total) onto the skin daily.   pantoprazole (PROTONIX) 40 MG tablet TAKE ONE TABLET EVERY DAY   tiotropium (SPIRIVA HANDIHALER) 18 MCG inhalation capsule Place 1 capsule (18 mcg total) into inhaler and inhale daily.   Vitamin D,  Ergocalciferol, (DRISDOL) 1.25 MG (50000 UNIT) CAPS capsule Take 1 capsule (50,000 Units total) by mouth every 7 (seven) days. (taking one tablet per week) walk in lab in office 1-2 weeks after completing prescription.   ziprasidone (GEODON) 80 MG capsule SMARTSIG:1 Tablet(s) By Mouth Every Evening   No facility-administered medications prior to visit.    Review of Systems     Objective    BP (!) 154/76 (BP Location: Right Arm, Patient Position: Sitting, Cuff Size: Large)    Pulse 85    Temp 99.5 F (37.5 C) (Oral)    Wt 178 lb (80.7 kg)    SpO2 96%    BMI 28.73 kg/m    Physical Exam Vitals and nursing note reviewed.  Constitutional:      General: She is not in acute distress.    Appearance: Normal appearance. She is overweight. She is not ill-appearing, toxic-appearing or diaphoretic.  HENT:     Head: Normocephalic and atraumatic.  Cardiovascular:     Rate and Rhythm: Normal rate and regular rhythm.     Pulses: Normal pulses.     Heart sounds: Normal heart sounds. No murmur heard.   No friction rub. No gallop.  Pulmonary:     Effort: Pulmonary effort is normal. No respiratory distress.     Breath sounds: Normal breath sounds. No stridor. No wheezing, rhonchi or rales.  Chest:     Chest wall: No tenderness.  Abdominal:     General: Bowel sounds are normal.     Palpations: Abdomen is soft.  Musculoskeletal:        General: No swelling, tenderness, deformity or signs of injury. Normal range of motion.     Right lower leg: No edema.     Left lower leg: No edema.  Skin:    General: Skin is warm and dry.     Capillary Refill: Capillary refill takes less than 2 seconds.     Coloration: Skin is not jaundiced or pale.     Findings: No bruising, erythema, lesion or rash.  Neurological:     General: No focal deficit present.     Mental Status: She is alert and oriented to person, place, and time. Mental status is at baseline.     Cranial Nerves: No cranial nerve deficit.      Sensory: No sensory deficit.     Motor: No weakness.     Coordination: Coordination normal.  Psychiatric:        Mood and Affect: Mood normal.        Speech: Speech is delayed.        Behavior: Behavior is agitated and hyperactive.        Thought Content: Thought content normal.        Judgment: Judgment normal.     No results found for any visits on 04/14/21.  Assessment & Plan     Problem List Items Addressed This Visit       Cardiovascular and Mediastinum  Hypertension associated with type 2 diabetes mellitus (HCC)    Chronic, elevated Recommend home checks Goal of <130/<80 Encouraged not to smoke prior to appt Check BP at home 3x/week Not currently on bp med- previous stable without use of rx Denies CP, SOB, DOE, LE edema Call 9-1-1 for emergenices      Relevant Orders   Comprehensive metabolic panel   CBC with Differential/Platelet     Endocrine   Hyperlipidemia associated with type 2 diabetes mellitus ([place])    Re emphasize goal of LDL <70 Encourage exercise and diet control Not on statin d/t pill burden MI and stroke risk elevated      Relevant Orders   Lipid Panel With LDL/HDL Ratio   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 Encouraged to use 1-800-QUIT-NOW.       Tobacco dependence due to cigarettes    Chronic, continued use Now has lung nodules that need additional f/u seen on low CT scan Continue to emphasize reduce use with goal of  cessation        Return in about 4 weeks (around 05/12/2021) for HTN management.      Vonna Kotyk, FNP, have reviewed all documentation for this visit. The documentation on 04/14/21 for the exam, diagnosis, procedures, and orders are all accurate and complete.    [name], [place] [phone] (phone) [phone] (fax)  [place]

## 2021-04-14 NOTE — Assessment & Plan Note (Signed)
3-5 minute discuss regarding risks of tobacco/nicotine use and recommendations on ways to reduce use and work towards cessation of use of tobacco/nicotine products. Encouraged to use 1-800-QUIT-NOW. ? ?

## 2021-04-14 NOTE — Assessment & Plan Note (Signed)
Type 2 diabetes mellitus with other neurologic complication, without long-term current use of insulin ([place]) ?Stable on use of metformin ?Repeat A1c ?Continue diet and exercise ?Encourage statin and BP control ?Encourage tobacco cessation ?

## 2021-04-14 NOTE — Assessment & Plan Note (Signed)
Chronic, elevated ?Recommend home checks ?Goal of <130/<80 ?Encouraged not to smoke prior to appt ?Check BP at home 3x/week ?Not currently on bp med- previous stable without use of rx ?Denies CP, SOB, DOE, LE edema ?Call 9-1-1 for emergenices ?

## 2021-04-14 NOTE — Patient Instructions (Signed)
Goal 130/80 ?Check BP 3x/week ?Arm cuff preferred ?Work on smoking reduction ?Do not smoke prior to BP checks ?

## 2021-04-15 ENCOUNTER — Other Ambulatory Visit: Payer: Self-pay | Admitting: Family Medicine

## 2021-04-15 DIAGNOSIS — E785 Hyperlipidemia, unspecified: Secondary | ICD-10-CM | POA: Insufficient documentation

## 2021-04-15 DIAGNOSIS — E1169 Type 2 diabetes mellitus with other specified complication: Secondary | ICD-10-CM

## 2021-04-15 LAB — CBC WITH DIFFERENTIAL/PLATELET
Basophils Absolute: 0.1 10*3/uL (ref 0.0–0.2)
Basos: 1 %
EOS (ABSOLUTE): 0.5 10*3/uL — ABNORMAL HIGH (ref 0.0–0.4)
Eos: 6 %
Hematocrit: 44.8 % (ref 34.0–46.6)
Hemoglobin: 14.6 g/dL (ref 11.1–15.9)
Immature Grans (Abs): 0 10*3/uL (ref 0.0–0.1)
Immature Granulocytes: 1 %
Lymphocytes Absolute: 2.8 10*3/uL (ref 0.7–3.1)
Lymphs: 32 %
MCH: 28.2 pg (ref 26.6–33.0)
MCHC: 32.6 g/dL (ref 31.5–35.7)
MCV: 87 fL (ref 79–97)
Monocytes Absolute: 0.7 10*3/uL (ref 0.1–0.9)
Monocytes: 7 %
Neutrophils Absolute: 4.8 10*3/uL (ref 1.4–7.0)
Neutrophils: 53 %
Platelets: 293 10*3/uL (ref 150–450)
RBC: 5.17 x10E6/uL (ref 3.77–5.28)
RDW: 13 % (ref 11.7–15.4)
WBC: 8.8 10*3/uL (ref 3.4–10.8)

## 2021-04-15 LAB — COMPREHENSIVE METABOLIC PANEL
ALT: 24 IU/L (ref 0–32)
AST: 19 IU/L (ref 0–40)
Albumin/Globulin Ratio: 2.7 — ABNORMAL HIGH (ref 1.2–2.2)
Albumin: 4.8 g/dL (ref 3.8–4.9)
Alkaline Phosphatase: 78 IU/L (ref 44–121)
BUN/Creatinine Ratio: 11 (ref 9–23)
BUN: 9 mg/dL (ref 6–24)
Bilirubin Total: 0.4 mg/dL (ref 0.0–1.2)
CO2: 23 mmol/L (ref 20–29)
Calcium: 9.6 mg/dL (ref 8.7–10.2)
Chloride: 105 mmol/L (ref 96–106)
Creatinine, Ser: 0.84 mg/dL (ref 0.57–1.00)
Globulin, Total: 1.8 g/dL (ref 1.5–4.5)
Glucose: 74 mg/dL (ref 70–99)
Potassium: 4.1 mmol/L (ref 3.5–5.2)
Sodium: 141 mmol/L (ref 134–144)
Total Protein: 6.6 g/dL (ref 6.0–8.5)
eGFR: 84 mL/min/{1.73_m2} (ref >59)

## 2021-04-15 LAB — MICROALBUMIN / CREATININE URINE RATIO
Creatinine, Urine: 32.2 mg/dL
Microalb/Creat Ratio: <9 mg/g creat (ref 0–29)
Microalbumin, Urine: <3 ug/mL

## 2021-04-15 LAB — LIPID PANEL WITH LDL/HDL RATIO
Cholesterol, Total: 162 mg/dL (ref 100–199)
HDL: 33 mg/dL — ABNORMAL LOW (ref >39)
LDL Chol Calc (NIH): 95 mg/dL (ref 0–99)
LDL/HDL Ratio: 2.9 ratio (ref 0.0–3.2)
Triglycerides: 199 mg/dL — ABNORMAL HIGH (ref 0–149)
VLDL Cholesterol Cal: 34 mg/dL (ref 5–40)

## 2021-04-15 LAB — HEMOGLOBIN A1C
Est. average glucose Bld gHb Est-mCnc: 120 mg/dL
Hgb A1c MFr Bld: 5.8 % — ABNORMAL HIGH (ref 4.8–5.6)

## 2021-04-15 MED ORDER — ROSUVASTATIN CALCIUM 5 MG PO TABS: 5.0000 mg | ORAL_TABLET | Freq: Every day | ORAL | 3 refills | Status: DC

## 2021-04-25 ENCOUNTER — Other Ambulatory Visit: Payer: Self-pay | Admitting: Family Medicine

## 2021-04-25 DIAGNOSIS — Z1231 Encounter for screening mammogram for malignant neoplasm of breast: Secondary | ICD-10-CM

## 2021-05-12 ENCOUNTER — Encounter: Payer: Self-pay | Admitting: Family Medicine

## 2021-05-12 ENCOUNTER — Ambulatory Visit (INDEPENDENT_AMBULATORY_CARE_PROVIDER_SITE_OTHER): Payer: Medicaid Other | Admitting: Family Medicine

## 2021-05-12 ENCOUNTER — Other Ambulatory Visit: Payer: Self-pay | Admitting: Family Medicine

## 2021-05-12 VITALS — BP 110/70 | HR 61 | Temp 98.1°F | Resp 16 | Wt 177.0 lb

## 2021-05-12 DIAGNOSIS — L84 Corns and callosities: Secondary | ICD-10-CM

## 2021-05-12 DIAGNOSIS — F17210 Nicotine dependence, cigarettes, uncomplicated: Secondary | ICD-10-CM

## 2021-05-12 DIAGNOSIS — I152 Hypertension secondary to endocrine disorders: Secondary | ICD-10-CM

## 2021-05-12 DIAGNOSIS — Z23 Encounter for immunization: Secondary | ICD-10-CM

## 2021-05-12 DIAGNOSIS — E1149 Type 2 diabetes mellitus with other diabetic neurological complication: Secondary | ICD-10-CM | POA: Diagnosis not present

## 2021-05-12 DIAGNOSIS — E1159 Type 2 diabetes mellitus with other circulatory complications: Secondary | ICD-10-CM | POA: Diagnosis not present

## 2021-05-12 DIAGNOSIS — E119 Type 2 diabetes mellitus without complications: Secondary | ICD-10-CM | POA: Insufficient documentation

## 2021-05-12 NOTE — Assessment & Plan Note (Signed)
Chronic use; pt remains pre-contemplative regarding cessation ?Will recheck with patient at next OV ?

## 2021-05-12 NOTE — Assessment & Plan Note (Signed)
1/2 dose provided today; consent reviewed ?

## 2021-05-12 NOTE — Progress Notes (Signed)
? ? [employer] as a Education administrator for [name], FNP.,have documented all relevant documentation on the behalf of [name], FNP,as directed by  [name], FNP while in the presence of [name], FNP.  ? ? ?Established patient visit ? ? ?Patient: Becky Hubbard   DOB: 1971-01-16   51 y.o. Female  MRN: [id number] ?Visit Date: 05/12/2021 ? ?Today's healthcare provider: [name], FNP  ? ?Re Introduced to nurse practitioner role and practice setting.  All questions answered.  Discussed provider/patient relationship and expectations. ? ?Chief Complaint  ?Patient presents with  ? Diabetes  ? ?Subjective  ?  ?HPI  ?Diabetes Mellitus Type II, Follow-up ? ?Lab Results  ?Component Value Date  ? HGBA1C 5.8 (H) 04/14/2021  ? HGBA1C 5.5 01/14/2021  ? HGBA1C 5.8 (A) 10/15/2020  ? ?Wt Readings from Last 3 Encounters:  ?05/12/21 177 lb (80.3 kg)  ?04/14/21 178 lb (80.7 kg)  ?04/02/21 181 lb (82.1 kg)  ? ?Last seen for diabetes 3 months ago.  ?Management since then includes  ? A1c remains stable; patient's mom asking to d/c metformin ?Metformin was initially started by psych provider; will ask their opinion ?Patient wishes to continue at this time  ?Marland Kitchen ?She reports excellent compliance with treatment. ?She is not having side effects.  ?Symptoms: ?No fatigue No foot ulcerations  ?No appetite changes No nausea  ?No paresthesia of the feet  No polydipsia  ?No polyuria No visual disturbances   ?No vomiting   ? ? ?Home blood sugar records: fasting range: <150 ? ?Episodes of hypoglycemia? No   ?  ?Current insulin regiment: none ?Most Recent Eye Exam: UTD; CMA K Wolford will send in [facility] records [facility] ?Current exercise: no regular exercise ?Current diet habits: well balanced ? ?Pertinent Labs: ?Lab Results  ?Component Value Date  ? CHOL 162 04/14/2021  ? HDL 33 (L) 04/14/2021  ? [place] 95 04/14/2021  ? TRIG 199 (H) 04/14/2021  ? CHOLHDL 5.7 (H) 04/25/2020  ? Lab Results  ?Component Value Date  ? NA  141 04/14/2021  ? K 4.1 04/14/2021  ? CREATININE 0.84 04/14/2021  ? EGFR 84 04/14/2021  ? MICROALBUR 10 07/04/2015  ? LABMICR <3.0 04/14/2021  ?  ? ?---------------------------------------------------------------------------------------------------  ? ?Medications: ?Outpatient Medications Prior to Visit  ?Medication Sig  ? albuterol (VENTOLIN HFA) 108 (90 Base) MCG/ACT inhaler Inhale 2 puffs into the lungs every 6 (six) hours as needed for wheezing or shortness of breath.  ? aspirin EC 81 MG EC tablet Take 1 tablet (81 mg total) by mouth daily. Swallow whole.  ? AUSTEDO 9 MG TABS Take 1 tablet by mouth 2 (two) times daily.  ? buPROPion (WELLBUTRIN XL) 150 MG 24 hr tablet Take 150 mg by mouth every morning.  ? DULoxetine (CYMBALTA) 60 MG capsule Take 1 capsule (60 mg total) by mouth daily.  ? EPINEPHrine (EPIPEN 2-PAK) 0.3 mg/0.3 mL IJ SOAJ injection Inject 0.3 mg into the muscle as needed for anaphylaxis.  ? EPINEPHrine 0.3 mg/0.3 mL IJ SOAJ injection Inject 0.3 mg into the muscle as needed for anaphylaxis.  ? fluticasone (FLONASE) 50 MCG/ACT nasal spray Place 2 sprays into both nostrils daily.  ? fluticasone-salmeterol (ADVAIR DISKUS) 250-50 MCG/ACT AEPB Inhale 1 puff into the lungs in the morning and at bedtime.  ? haloperidol (HALDOL) 5 MG tablet Take 1 tablet (5 mg total) by mouth 2 (two) times daily.  ? hydrOXYzine (ATARAX/VISTARIL) 50 MG tablet TAKE ONE  TABLET BY MOUTH 3 TIMES DAILY AS NEEDED (DOSAGE INCREASE)  ? lisdexamfetamine (VYVANSE) 40 MG capsule Take 1 capsule (40 mg total) by mouth daily.  ? LORazepam (ATIVAN) 1 MG tablet Take 1 mg by mouth 2 (two) times daily.  ? meloxicam (MOBIC) 15 MG tablet TAKE 1 TABLET BY MOUTH DAILY  ? metFORMIN (GLUCOPHAGE) 500 MG tablet Take 1 tablet (500 mg total) by mouth daily. with food  ? nicotine (NICODERM CQ - DOSED IN MG/24 HOURS) 21 mg/24hr patch Place 1 patch (21 mg total) onto the skin daily.  ? pantoprazole (PROTONIX) 40 MG tablet TAKE ONE TABLET EVERY DAY  ?  rosuvastatin (CRESTOR) 5 MG tablet Take 1 tablet (5 mg total) by mouth daily.  ? tiotropium (SPIRIVA HANDIHALER) 18 MCG inhalation capsule Place 1 capsule (18 mcg total) into inhaler and inhale daily.  ? Vitamin D, Ergocalciferol, (DRISDOL) 1.25 MG (50000 UNIT) CAPS capsule Take 1 capsule (50,000 Units total) by mouth every 7 (seven) days. (taking one tablet per week) walk in lab in office 1-2 weeks after completing prescription.  ? ziprasidone (GEODON) 80 MG capsule SMARTSIG:1 Tablet(s) By Mouth Every Evening  ? ?No facility-administered medications prior to visit.  ? ? ?Review of Systems ? ? ?  Objective  ?  ?BP 110/70   Pulse 61   Temp 98.1 ?F (36.7 ?C) (Temporal)   Resp 16   Wt 177 lb (80.3 kg)   BMI 28.57 kg/m?  ? ? ?Physical Exam ?Vitals and nursing note reviewed.  ?Constitutional:   ?   General: She is not in acute distress. ?   Appearance: Normal appearance. She is overweight. She is not ill-appearing, toxic-appearing or diaphoretic.  ?HENT:  ?   Head: Normocephalic and atraumatic.  ?Cardiovascular:  ?   Rate and Rhythm: Normal rate and regular rhythm.  ?   Pulses: Normal pulses.     ?     Dorsalis pedis pulses are 2+ on the right side and 2+ on the left side.  ?     Posterior tibial pulses are 2+ on the right side and 2+ on the left side.  ?   Heart sounds: Normal heart sounds. No murmur heard. ?  No friction rub. No gallop.  ?Pulmonary:  ?   Effort: Pulmonary effort is normal. No respiratory distress.  ?   Breath sounds: No stridor. Wheezing and rhonchi present. No rales.  ?Chest:  ?   Chest wall: No tenderness.  ?Abdominal:  ?   General: Bowel sounds are normal.  ?   Palpations: Abdomen is soft.  ?Musculoskeletal:     ?   General: No swelling, tenderness, deformity or signs of injury. Normal range of motion.  ?   Right lower leg: No edema.  ?   Left lower leg: No edema.  ?     Feet: ? ?Feet:  ?   Right foot:  ?   Protective Sensation: 10 sites tested.  10 sites sensed.  ?   Skin integrity: Callus and  dry skin present.  ?   Toenail Condition: Right toenails are normal.  ?   Left foot:  ?   Protective Sensation: 10 sites tested.  10 sites sensed.  ?   Skin integrity: Callus and dry skin present.  ?   Toenail Condition: Left toenails are normal.  ?   Comments: Thickened, callus skin on bilateral feet ?Recommend f/u with podiatry  ?Skin: ?   General: Skin is warm and dry.  ?  Capillary Refill: Capillary refill takes less than 2 seconds.  ?   Coloration: Skin is not jaundiced or pale.  ?   Findings: No bruising, erythema, lesion or rash.  ?Neurological:  ?   General: No focal deficit present.  ?   Mental Status: She is alert and oriented to person, place, and time. Mental status is at baseline.  ?   Cranial Nerves: No cranial nerve deficit.  ?   Sensory: No sensory deficit.  ?   Motor: No weakness.  ?   Coordination: Coordination normal.  ?Psychiatric:     ?   Mood and Affect: Mood normal. Affect is blunt and flat.     ?   Speech: Speech is delayed.     ?   Behavior: Behavior normal.     ?   Thought Content: Thought content normal.     ?   Judgment: Judgment normal.  ?  ? ?No results found for any visits on 05/12/21. ? Assessment & Plan  ?  ? ?Problem List Items Addressed This Visit   ? ?  ? Cardiovascular and Mediastinum  ? Hypertension associated with type 2 diabetes mellitus ([place]) - Primary  ?  Chronic, stable within DM BP parameters today 110/70 ?Continue to monitor BP with use of nicotine and psychiatric medications ?Goal <130/<80 ?Continue to recommend weight reduction by 5-10% and use of DASH diet  ? ?  ?  ?  ? Endocrine  ? RESOLVED: Diabetes mellitus ([place])  ? Type 2 diabetes mellitus with other diabetic neurological complication ([place])  ?  Chronic, stable ?A1c 1 month ago at 5.8% ?Recommend continued use of metformin 500 mg QD to assist in prevent relapse of A1c back into higher levels ?Associated with major depression with psychosis, pt is f/b psych; foot exam completed today ? ?  ?  ? Relevant Orders  ?  Ambulatory referral to Podiatry  ?  ? Musculoskeletal and Integument  ? Foot callus  ?  Acute concern; seen on DM foot exam ?Recommend f/u with podiatry to assist in shaving/removal ?Hx of A1c at 5.8%; do not an

## 2021-05-12 NOTE — Assessment & Plan Note (Addendum)
Chronic, stable ?A1c 1 month ago at 5.8% ?Recommend continued use of metformin 500 mg QD to assist in prevent relapse of A1c back into higher levels ?Associated with major depression with psychosis, pt is f/b psych; foot exam completed today ? ?

## 2021-05-12 NOTE — Assessment & Plan Note (Signed)
Chronic, stable within DM BP parameters today 110/70 ?Continue to monitor BP with use of nicotine and psychiatric medications ?Goal <130/<80 ?Continue to recommend weight reduction by 5-10% and use of DASH diet  ? ?

## 2021-05-12 NOTE — Assessment & Plan Note (Signed)
Acute concern; seen on DM foot exam ?Recommend f/u with podiatry to assist in shaving/removal ?Hx of A1c at 5.8%; do not anticipate poor wound healing ?

## 2021-05-13 NOTE — Telephone Encounter (Signed)
LR 02-11-21 #30 with 1 r/f  ?LOV 05-12-21 ?NOV 10-09-21 ?

## 2021-05-14 ENCOUNTER — Ambulatory Visit: Payer: Self-pay | Admitting: *Deleted

## 2021-05-14 NOTE — Telephone Encounter (Signed)
?  Chief Complaint: soreness at injection site of Shingles injection right arm ?Symptoms: Red, sore, warm. ?Frequency: Since Monday when she received the shot. ?Pertinent Negatives: Patient denies Fever or having a knot at the site ?Disposition: '[]'$ ED /'[]'$ Urgent Care (no appt availability in office) / '[]'$ Appointment(In office/virtual)/ '[]'$  [facility]/ '[x]'$ Home Care/ '[]'$ Refused Recommended Disposition /'[]'$ [place] [facility]/ '[]'$  Follow-up with PCP ?Additional Notes:   ?

## 2021-05-14 NOTE — Telephone Encounter (Signed)
Reason for Disposition ? Shingles (Herpes zoster; Shingrix) vaccine reactions ? ?Answer Assessment - Initial Assessment Questions ?1. SYMPTOMS: "What is the main symptom?" (e.g., redness, swelling, pain)  ?    She got a shingles shot Monday and the site is red and sore.   Right arm. ?2. ONSET: "When was the vaccine (shot) given?" "How much later did the Monday begin?" (e.g., hours, days ago)  ?    Hot and red ?3. SEVERITY: "How bad is it?"  ?    It's just red ?4. FEVER: "Is there a fever?" If Yes, ask: "What is it, how was it measured, and when did it start?"  ?    No ?5. IMMUNIZATIONS GIVEN: "What shots have you recently received?" ?    Shingles ?6. PAST REACTIONS: "Have you reacted to immunizations before?" If Yes, ask: "What happened?" ?    Red but not bright red.   No drainage.  No swelling ?7. OTHER SYMPTOMS: "Do you have any other symptoms?" ?    No ? ?Protocols used: Immunization Reactions-A-AH ? ?

## 2021-05-19 ENCOUNTER — Encounter: Payer: Self-pay | Admitting: Podiatry

## 2021-05-19 ENCOUNTER — Ambulatory Visit: Payer: Medicaid Other | Admitting: Podiatry

## 2021-05-19 DIAGNOSIS — B351 Tinea unguium: Secondary | ICD-10-CM | POA: Diagnosis not present

## 2021-05-19 DIAGNOSIS — D492 Neoplasm of unspecified behavior of bone, soft tissue, and skin: Secondary | ICD-10-CM | POA: Diagnosis not present

## 2021-05-19 DIAGNOSIS — D2371 Other benign neoplasm of skin of right lower limb, including hip: Secondary | ICD-10-CM

## 2021-05-19 DIAGNOSIS — M79676 Pain in unspecified toe(s): Secondary | ICD-10-CM | POA: Diagnosis not present

## 2021-05-19 DIAGNOSIS — E1142 Type 2 diabetes mellitus with diabetic polyneuropathy: Secondary | ICD-10-CM

## 2021-05-19 DIAGNOSIS — D2372 Other benign neoplasm of skin of left lower limb, including hip: Secondary | ICD-10-CM

## 2021-05-19 NOTE — Progress Notes (Signed)
?Subjective:  ?Patient ID: Becky Hubbard, female    DOB: 04/08/1970,  MRN: [id number] ?HPI ?Chief Complaint  ?Patient presents with  ? Diabetes  ?  Diabetic Foot Exam - last a1c was 5.0, PCP concerned about calluses hallux bilateral   ? New Patient (Initial Visit)  ? ? ?51 y.o. female presents with the above complaint.  ? ?ROS: Denies fever chills nausea vomiting muscle aches pains calf pain back pain chest pain shortness of breath. ? ?Past Medical History:  ?Diagnosis Date  ? ADD (attention deficit disorder)   ? Anxiety   ? Arthritis   ? Auditory hallucinations   ? Carpal tunnel syndrome on both sides   ? Cervical spine fracture ([place]) 2008  ? Closed with screw C2-3 [facility] Dr. Delilah Shan  ? Degenerative joint disease of spine   ? Depression   ? Diabetes mellitus without complication ([place])   ? Family history of adverse reaction to anesthesia   ? mom- slow to awaken  ? Fibromyalgia   ? Foot fracture, left   ? Gastroparesis   ? Gastroparesis   ? GERD (gastroesophageal reflux disease)   ? Hearing loss   ? Herniated cervical disc   ? History of prescription drug abuse   ? Neuromuscular disorder ([place])   ? fibromyalgia/chronic fatique syndrome  ? Neuropathy of foot   ? OCD (obsessive compulsive disorder)   ? Other specified disorder of skin   ? neurological skin disorder-breaks out when nervous  ? 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 NOT seen on exam 04/10/14  ? Plaque psoriasis   ? PTSD (post-traumatic stress disorder)   ? Stroke [facility])   ? Ulcer   ? ?Past Surgical History:  ?Procedure Laterality Date  ? CARPAL TUNNEL RELEASE Left 07/12/2015  ? Procedure: OPEN CARPAL TUNNEL RELEASE OF LEFT HAND;  Surgeon: Leanor Kail, MD;  Location: [place];  Service: Orthopedics;  Laterality: Left;  ? cervical radiofrequency neurotomy    ? FRACTURE SURGERY    ? MOUTH SURGERY    ? upper and lower dentures  ? NASAL SEPTUM SURGERY    ? occipital nerve blocks    ? SPINE SURGERY    ? cervical  ? ? ?Current Outpatient Medications:  ?  albuterol  (VENTOLIN HFA) 108 (90 Base) MCG/ACT inhaler, Inhale 2 puffs into the lungs every 6 (six) hours as needed for wheezing or shortness of breath., Disp: 8 g, Rfl: 0 ?  aspirin EC 81 MG EC tablet, Take 1 tablet (81 mg total) by mouth daily. Swallow whole., Disp: 30 tablet, Rfl: 1 ?  AUSTEDO 9 MG TABS, Take 1 tablet by mouth 2 (two) times daily., Disp: , Rfl:  ?  buPROPion (WELLBUTRIN SR) 200 MG 12 hr tablet, Take 200 mg by mouth every morning., Disp: , Rfl:  ?  DULoxetine (CYMBALTA) 60 MG capsule, Take 1 capsule (60 mg total) by mouth daily., Disp: 30 capsule, Rfl: 1 ?  EPINEPHrine (EPIPEN 2-PAK) 0.3 mg/0.3 mL IJ SOAJ injection, Inject 0.3 mg into the muscle as needed for anaphylaxis., Disp: 1 each, Rfl: 1 ?  EPINEPHrine 0.3 mg/0.3 mL IJ SOAJ injection, Inject 0.3 mg into the muscle as needed for anaphylaxis., Disp: 1 each, Rfl: 0 ?  fluticasone (FLONASE) 50 MCG/ACT nasal spray, Place 2 sprays into both nostrils daily., Disp: , Rfl:  ?  fluticasone-salmeterol (ADVAIR DISKUS) 250-50 MCG/ACT AEPB, Inhale 1 puff into the lungs in the morning and at bedtime., Disp: 60 each, Rfl: 6 ?  haloperidol (HALDOL) 5 MG tablet, Take 1 tablet (5 mg total) by mouth 2 (two) times daily., Disp: 60 tablet, Rfl: 1 ?  hydrOXYzine (ATARAX/VISTARIL) 50 MG tablet, TAKE ONE TABLET BY MOUTH 3 TIMES DAILY AS NEEDED (DOSAGE INCREASE), Disp: , Rfl:  ?  lisdexamfetamine (VYVANSE) 40 MG capsule, Take 1 capsule (40 mg total) by mouth daily., Disp: 30 capsule, Rfl: 0 ?  LORazepam (ATIVAN) 1 MG tablet, Take 1 mg by mouth 2 (two) times daily., Disp: , Rfl:  ?  meloxicam (MOBIC) 15 MG tablet, Take 1 tablet (15 mg total) by mouth daily as needed. Avoid additional NSAIDs with use. Take with food., Disp: 30 tablet, Rfl: 1 ?  metFORMIN (GLUCOPHAGE) 500 MG tablet, Take 1 tablet (500 mg total) by mouth daily. with food, Disp: 90 tablet, Rfl: 3 ?  nicotine (NICODERM CQ - DOSED IN MG/24 HOURS) 21 mg/24hr patch, Place 1 patch (21 mg total) onto the skin daily.,  Disp: 29 patch, Rfl: 11 ?  pantoprazole (PROTONIX) 40 MG tablet, TAKE ONE TABLET EVERY DAY, Disp: 30 tablet, Rfl: 3 ?  rosuvastatin (CRESTOR) 5 MG tablet, Take 1 tablet (5 mg total) by mouth daily., Disp: 90 tablet, Rfl: 3 ?  tiotropium (SPIRIVA HANDIHALER) 18 MCG inhalation capsule, Place 1 capsule (18 mcg total) into inhaler and inhale daily., Disp: 30 capsule, Rfl: 6 ?  Vitamin D, Ergocalciferol, (DRISDOL) 1.25 MG (50000 UNIT) CAPS capsule, Take 1 capsule (50,000 Units total) by mouth every 7 (seven) days. (taking one tablet per week) walk in lab in office 1-2 weeks after completing prescription., Disp: 12 capsule, Rfl: 0 ?  ziprasidone (GEODON) 80 MG capsule, SMARTSIG:1 Tablet(s) By Mouth Every Evening, Disp: , Rfl:  ? ?Allergies  ?Allergen Reactions  ? Doxycycline   ? Lyrica [Pregabalin] Swelling  ?  Ankles swell and turn red  ? Neurontin [Gabapentin]   ? Chantix [Varenicline] Rash  ? Prednisone Other (See Comments)  ?  Makes her crazy  ? Other Other (See Comments)  ?  Cats - cough/sneezing  ? Penicillin G Hives  ? Penicillins Hives  ?  Has patient had a PCN reaction causing immediate rash, facial/tongue/throat swelling, SOB or lightheadedness with hypotension: No ?Has patient had a PCN reaction causing severe rash involving mucus membranes or skin necrosis: No ?Has patient had a PCN reaction that required hospitalization No ?Has patient had a PCN reaction occurring within the last 10 years: No ?If all of the above answers are "NO", then may proceed with Cephalosporin use. ?  ? Sulfa Antibiotics Hives, Rash and Other (See Comments)  ? ?Review of Systems ?Objective:  ?There were no vitals filed for this visit. ? ?General: Well developed, nourished, in no acute distress, alert and oriented x3  ? ?Dermatological: Skin is warm, dry and supple bilateral. Nails x 10 are well maintained; remaining integument appears unremarkable at this time. There are no open sores, no preulcerative lesions, no rash or signs of  infection present.  Dry xerotic skin bilateral no rashes.  Heel fissures bilateral noninfected not bleeding.  Benign skin lesion medial aspect hallux left ? ?Vascular: Dorsalis Pedis artery and Posterior Tibial artery pedal pulses are 2/4 bilateral with immedate capillary fill time. Pedal hair growth present. No varicosities and no lower extremity edema present bilateral.  ? ?Neruologic: Grossly intact via light touch bilateral. Vibratory intact via tuning fork bilateral. Protective threshold with Semmes Wienstein monofilament intact to all pedal sites bilateral. Patellar and Achilles deep tendon reflexes 2+ bilateral. No Babinski  or clonus noted bilateral.  ? ?Musculoskeletal: No gross boney pedal deformities bilateral. No pain, crepitus, or limitation noted with foot and ankle range of motion bilateral. Muscular strength 5/5 in all groups tested bilateral. ? ?Gait: Unassisted, Nonantalgic.  ? ? ?Radiographs: ? ?None taken ? ?Assessment & Plan:  ? ?Assessment: Diabetes without significant neuropathic changes to the foot.  Xerosis bilateral.  Long thick mycotic nails. ? ?Plan: Nails were debrided in thickness and length today discussed with her and her mother what types of cream should be used for her feet and legs.  Debrided all benign skin lesions. ? ? ? ? ?Fronia Depass T. [place], DPM ?

## 2021-06-06 ENCOUNTER — Ambulatory Visit
Admission: RE | Admit: 2021-06-06 | Discharge: 2021-06-06 | Disposition: A | Discharge status: Home / Self Care | Payer: Medicaid Other | Source: Ambulatory Visit | Attending: Family Medicine | Admitting: Family Medicine

## 2021-06-06 DIAGNOSIS — Z1231 Encounter for screening mammogram for malignant neoplasm of breast: Secondary | ICD-10-CM

## 2021-06-10 ENCOUNTER — Other Ambulatory Visit: Payer: Self-pay | Admitting: Family Medicine

## 2021-07-15 ENCOUNTER — Other Ambulatory Visit: Payer: Self-pay | Admitting: Family Medicine

## 2021-08-07 ENCOUNTER — Ambulatory Visit (INDEPENDENT_AMBULATORY_CARE_PROVIDER_SITE_OTHER): Payer: Medicaid Other | Admitting: Pulmonary Disease

## 2021-08-07 ENCOUNTER — Encounter: Payer: Self-pay | Admitting: Pulmonary Disease

## 2021-08-07 VITALS — BP 124/68 | HR 100 | Temp 98.0°F | Ht 65.5 in | Wt 176.0 lb

## 2021-08-07 DIAGNOSIS — R0602 Shortness of breath: Secondary | ICD-10-CM

## 2021-08-07 DIAGNOSIS — F17210 Nicotine dependence, cigarettes, uncomplicated: Secondary | ICD-10-CM | POA: Diagnosis not present

## 2021-08-07 DIAGNOSIS — J449 Chronic obstructive pulmonary disease, unspecified: Secondary | ICD-10-CM | POA: Diagnosis not present

## 2021-08-07 DIAGNOSIS — J4489 Other specified chronic obstructive pulmonary disease: Secondary | ICD-10-CM

## 2021-08-07 NOTE — Progress Notes (Signed)
Subjective:    Patient ID: Becky Hubbard, female    DOB: 02/18/1970, 51 y.o.   MRN: [id number] Patient Care Team: [name], FNP as PCP - General (Family Medicine) Werner Lean, MD as PCP - Cardiology (Cardiology)  Chief Complaint  Patient presents with   Follow-up    C/o prod cough with white sputum.    HPI Patient is a 51 year old current smoker (1 PPD) who follows here for the issue of dyspnea.  This is a scheduled visit.  Last seen on 15 January 2021 at that time doing well on her inhalers.  At that time she was on Advair and Spiriva but now her insurance company is back to covering Trelegy and she is using Trelegy 100 with good control of symptoms of dyspnea.  She has combined obstructive restrictive physiology by PFTs that is mild.  Today she presents without specific symptomatology.  Does not endorse any shortness of breath.  She has occasional cough productive of whitish sputum usually in the mornings.  This is unchanged. She is very challenging patient due to very limited interaction with the examiner. She has a very flat affect and is on multiple psychiatric medications.  She had her first lung cancer screening program imaging performed February 2023 this was a lung RADS 2 which is benign.  She does not endorse any other symptomatology today.  DATA: 11/15/2019 2D echo: LVEF 60 to 65%.  No wall motion abnormality.  No valvular abnormality. 03/25/2020 CT chest: Mild subpleural scarring posterior medial right upper lobe.  Subpleural left lower lobe nodules 5 mm in size, no adenopathy no parenchymal abnormalities. 06/04/2020 PFTs: Mild restrictive physiology likely due to overweight, small airways component, diffusion capacity normal 04/02/2021 LDCT chest: Lungs RADS 2, tiny perifissural nodules noted on the left lung measuring up to 5.8 mm subpleural lymph nodes.  No suspicious pulmonary nodule or mass   Review of Systems A 10 point review of systems was performed  and it is as noted above otherwise negative.  Patient Active Problem List   Diagnosis Date Noted   Need for zoster vaccination 05/12/2021   Foot callus 05/12/2021   Hyperlipidemia LDL goal <70 04/15/2021   Hypertension associated with type 2 diabetes mellitus ([place]) 04/14/2021   Hyperlipidemia associated with type 2 diabetes mellitus ([place]) 04/14/2021   Tobacco dependence due to cigarettes 04/14/2021   Tobacco dependence 01/14/2021   Acute pain of left shoulder 01/14/2021   Flu vaccine need 01/14/2021   Encounter for tobacco use cessation counseling 01/14/2021   Major depressive disorder, recurrent episode, severe, with psychosis ([place]) 01/07/2020   Type 2 diabetes mellitus with other diabetic neurological complication ([place]) 77/41/2878   Tachycardia 07/04/2017   Social History   Tobacco Use   Smoking status: Every Day    Packs/day: 2.00    Years: 20.00    Total pack years: 40.00    Types: Cigarettes    Start date: 03/01/2011   Smokeless tobacco: Never   Tobacco comments:    1PPD 08/07/2021  Substance Use Topics   Alcohol use: No   Allergies  Allergen Reactions   Doxycycline    Lyrica [Pregabalin] Swelling    Ankles swell and turn red   Neurontin [Gabapentin]    Chantix [Varenicline] Rash   Prednisone Other (See Comments)    Makes her crazy   Other Other (See Comments)    Cats - cough/sneezing   Penicillin G Hives   Penicillins Hives    Has patient had  a PCN reaction causing immediate rash, facial/tongue/throat swelling, SOB or lightheadedness with hypotension: No Has patient had a PCN reaction causing severe rash involving mucus membranes or skin necrosis: No Has patient had a PCN reaction that required hospitalization No Has patient had a PCN reaction occurring within the last 10 years: No If all of the above answers are "NO", then may proceed with Cephalosporin use.    Sulfa Antibiotics Hives, Rash and Other (See Comments)   Current Meds  Medication Sig    albuterol (VENTOLIN HFA) 108 (90 Base) MCG/ACT inhaler Inhale 2 puffs into the lungs every 6 (six) hours as needed for wheezing or shortness of breath.   aspirin EC 81 MG EC tablet Take 1 tablet (81 mg total) by mouth daily. Swallow whole.   AUSTEDO 9 MG TABS Take 1 tablet by mouth 2 (two) times daily.   buPROPion (WELLBUTRIN SR) 200 MG 12 hr tablet Take 200 mg by mouth every morning.   DULoxetine (CYMBALTA) 60 MG capsule Take 1 capsule (60 mg total) by mouth daily.   EPINEPHrine (EPIPEN 2-PAK) 0.3 mg/0.3 mL IJ SOAJ injection Inject 0.3 mg into the muscle as needed for anaphylaxis.   EPINEPHrine 0.3 mg/0.3 mL IJ SOAJ injection Inject 0.3 mg into the muscle as needed for anaphylaxis.   fluticasone (FLONASE) 50 MCG/ACT nasal spray Place 2 sprays into both nostrils daily.   Fluticasone-Umeclidin-Vilant (TRELEGY ELLIPTA) 100-62.5-25 MCG/ACT AEPB Inhale 1 puff into the lungs daily.   haloperidol (HALDOL) 5 MG tablet Take 1 tablet (5 mg total) by mouth 2 (two) times daily.   hydrOXYzine (ATARAX/VISTARIL) 50 MG tablet TAKE ONE TABLET BY MOUTH 3 TIMES DAILY AS NEEDED (DOSAGE INCREASE)   lisdexamfetamine (VYVANSE) 40 MG capsule Take 1 capsule (40 mg total) by mouth daily.   LORazepam (ATIVAN) 1 MG tablet Take 1 mg by mouth 2 (two) times daily.   meloxicam (MOBIC) 15 MG tablet TAKE 1 TABLET BY MOUTH DAILY. AVOID ADDITIONAL NSAIDS WITH USE. TAKE WITH FOOD.   metFORMIN (GLUCOPHAGE) 500 MG tablet Take 1 tablet (500 mg total) by mouth daily. with food   nicotine (NICODERM CQ - DOSED IN MG/24 HOURS) 21 mg/24hr patch Place 1 patch (21 mg total) onto the skin daily.   pantoprazole (PROTONIX) 40 MG tablet TAKE ONE TABLET EVERY DAY   rosuvastatin (CRESTOR) 5 MG tablet Take 1 tablet (5 mg total) by mouth daily.   Vitamin D, Ergocalciferol, (DRISDOL) 1.25 MG (50000 UNIT) CAPS capsule Take 1 capsule (50,000 Units total) by mouth every 7 (seven) days. (taking one tablet per week) walk in lab in office 1-2 weeks after  completing prescription.   ziprasidone (GEODON) 80 MG capsule SMARTSIG:1 Tablet(s) By Mouth Every Evening   [DISCONTINUED] fluticasone-salmeterol (ADVAIR DISKUS) 250-50 MCG/ACT AEPB Inhale 1 puff into the lungs in the morning and at bedtime.   Immunization History  Administered Date(s) Administered   Influenza,inj,Quad PF,6+ Mos 01/14/2021   Influenza-Unspecified 04/09/2014, 11/04/2015, 10/25/2018   Pneumococcal Polysaccharide-23 12/19/2008   Tdap 07/31/2014   Zoster Recombinat (Shingrix) 05/12/2021       Objective:   Physical Exam BP 124/68 (BP Location: Left Arm, Cuff Size: Normal)   Pulse 100   Temp 98 F (36.7 C) (Temporal)   Ht 5' 5.5" (1.664 m)   Wt 176 lb (79.8 kg)   SpO2 97%   BMI 28.84 kg/m  GENERAL: Well-developed overweight woman, no acute distress.  Flat affect.  Fully ambulatory.  Well-groomed.  No conversational dyspnea. HEAD: Normocephalic, atraumatic.  EYES:  Pupils equal, round, reactive to light.  No scleral icterus.  MOUTH: Poor dentition, oral mucosa moist. NECK: Supple. No thyromegaly. Trachea midline. No JVD.  No adenopathy. PULMONARY: Good air entry bilaterally.  No adventitious sounds. CARDIOVASCULAR: S1 and S2. Regular rate and rhythm.  No rubs, murmurs or gallops heard. ABDOMEN: Benign. MUSCULOSKELETAL: No joint deformity, no clubbing, no edema.  NEUROLOGIC:Akathisia noted.  No distinct focality. SKIN: Intact,warm,dry. 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 Shortness of breath  R06.02    Markedly improved on LABA/LAMA/ICS combination    3. Tobacco dependence due to cigarettes  F17.210    Patient counseled regards discontinuation of smoking Total counseling time 3 to 5 minutes.     Patient appears to be well compensated.  Continue Trelegy Ellipta 100.  We will see the  patient in follow-up in 6 months time she is to contact us prior to that time should any new problems arise.  Renold Don, MD Advanced Bronchoscopy PCCM [facility]    *This note was dictated using voice recognition software/Dragon.  Despite best efforts to proofread, errors can occur which can change the meaning. Any transcriptional errors that result from this process are unintentional and may not be fully corrected at the time of dictation.

## 2021-08-07 NOTE — Patient Instructions (Signed)
Your lungs sounded clear today.  The scan of your lungs that was done in February looked good.  Please make an effort to quit smoking.  Continue using your Trelegy.  We will see you in follow-up in 6 months time call sooner should any new problems arise

## 2021-09-22 IMAGING — MG MM DIGITAL SCREENING BILAT W/ TOMO AND CAD
6 of 10 series · 6 of 30 positions shown · non-contrast
Comparison: Previous exam(s).

CLINICAL DATA: Screening.

EXAM:
DIGITAL SCREENING BILATERAL MAMMOGRAM WITH TOMOSYNTHESIS AND CAD
TECHNIQUE: Bilateral screening digital craniocaudal and mediolateral oblique
mammograms were obtained. Bilateral screening digital breast
tomosynthesis was performed. The images were evaluated with
computer-aided detection.

[L CC synth-2D]
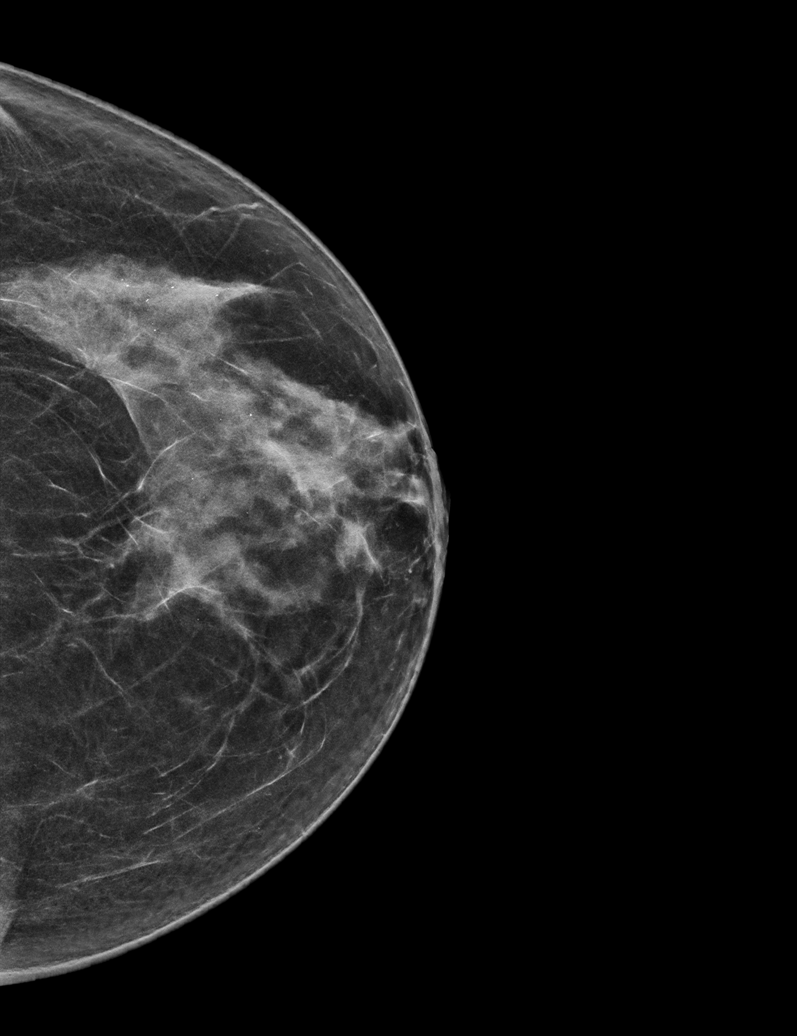

[R CC synth-2D]
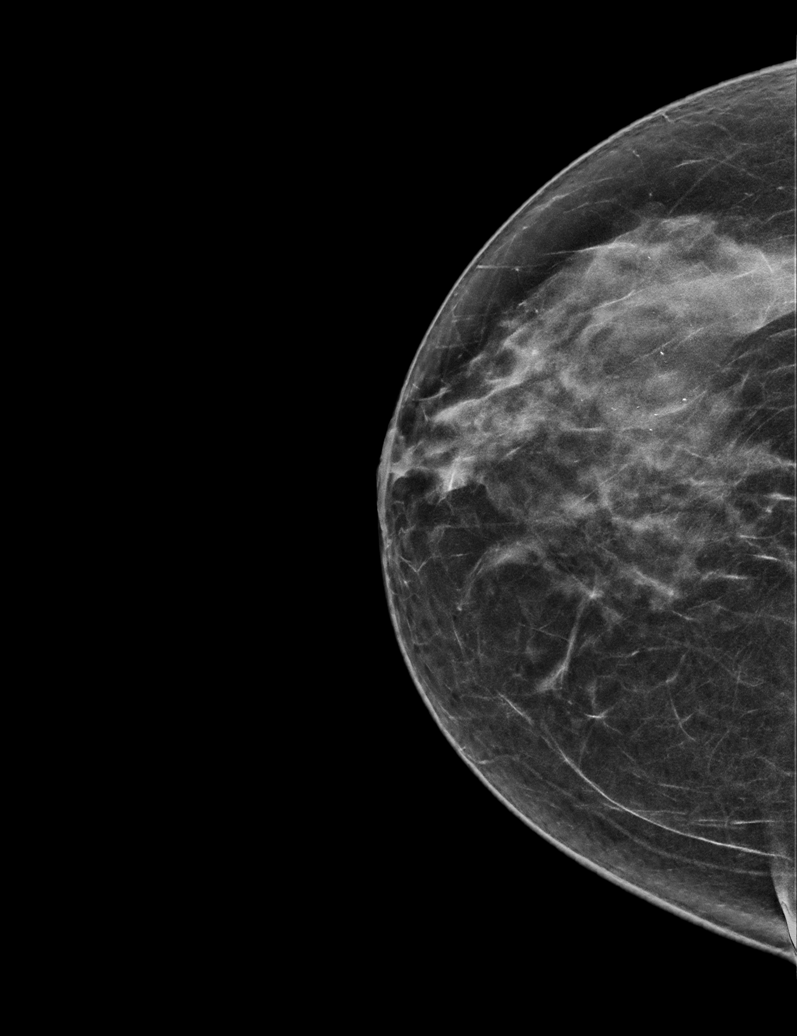

[L MLO synth-2D (1 of 2)]
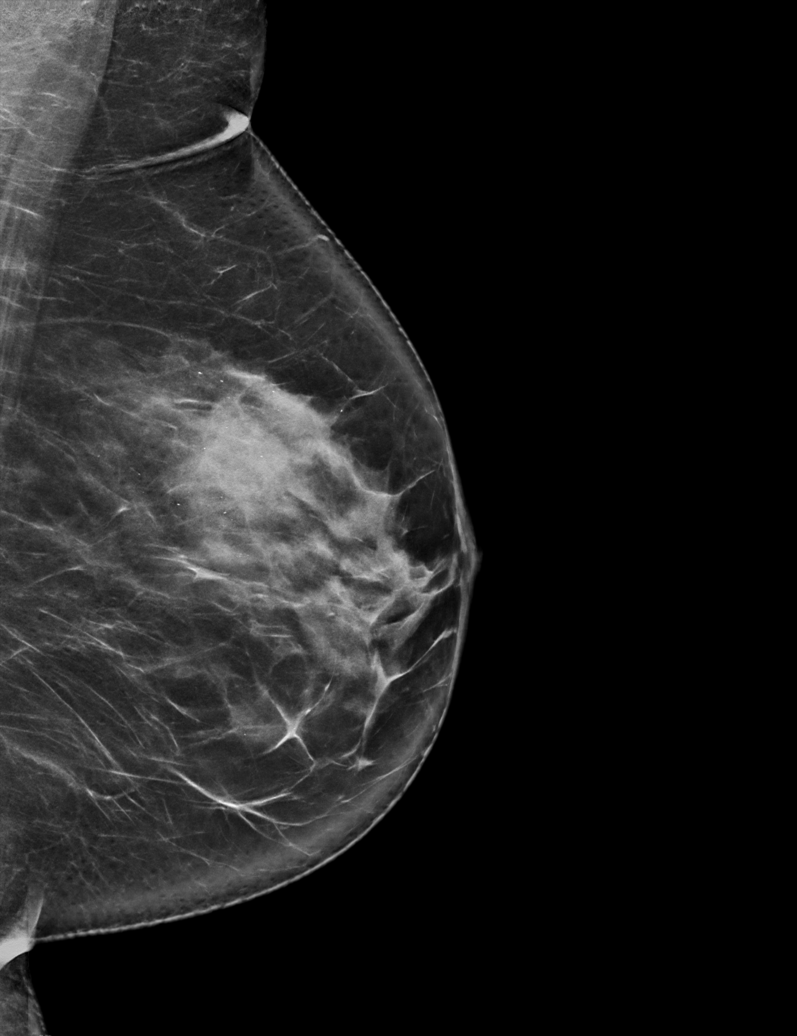

[R MLO synth-2D]
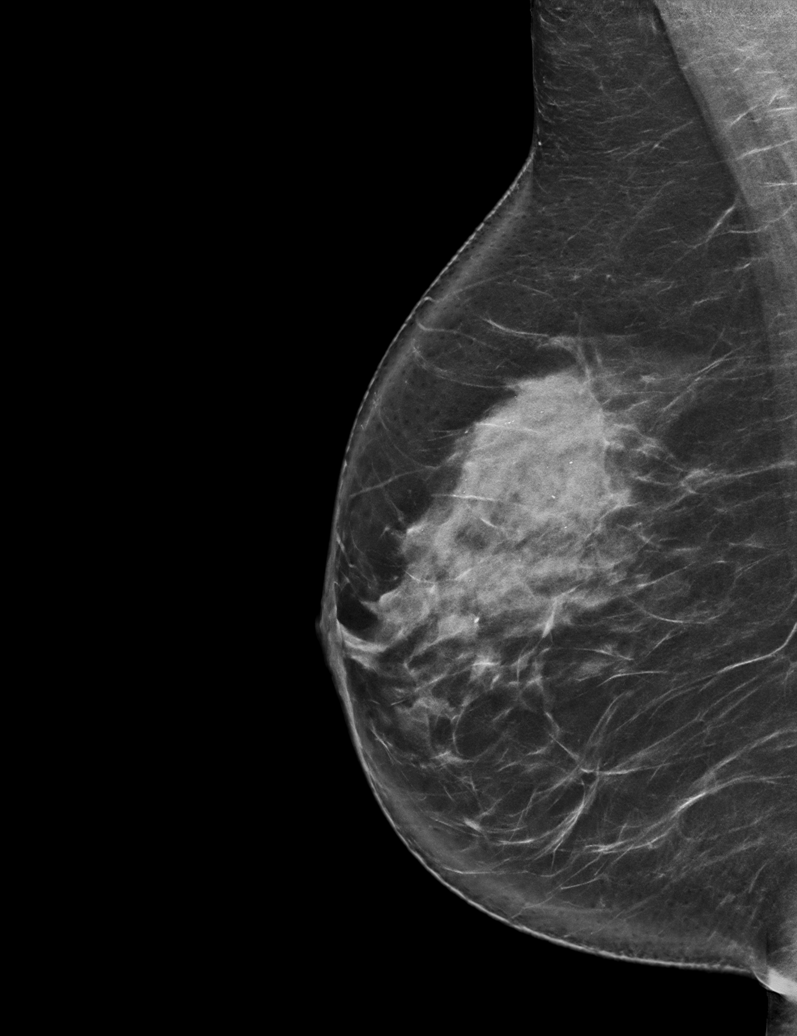

[L MLO synth-2D (2 of 2)]
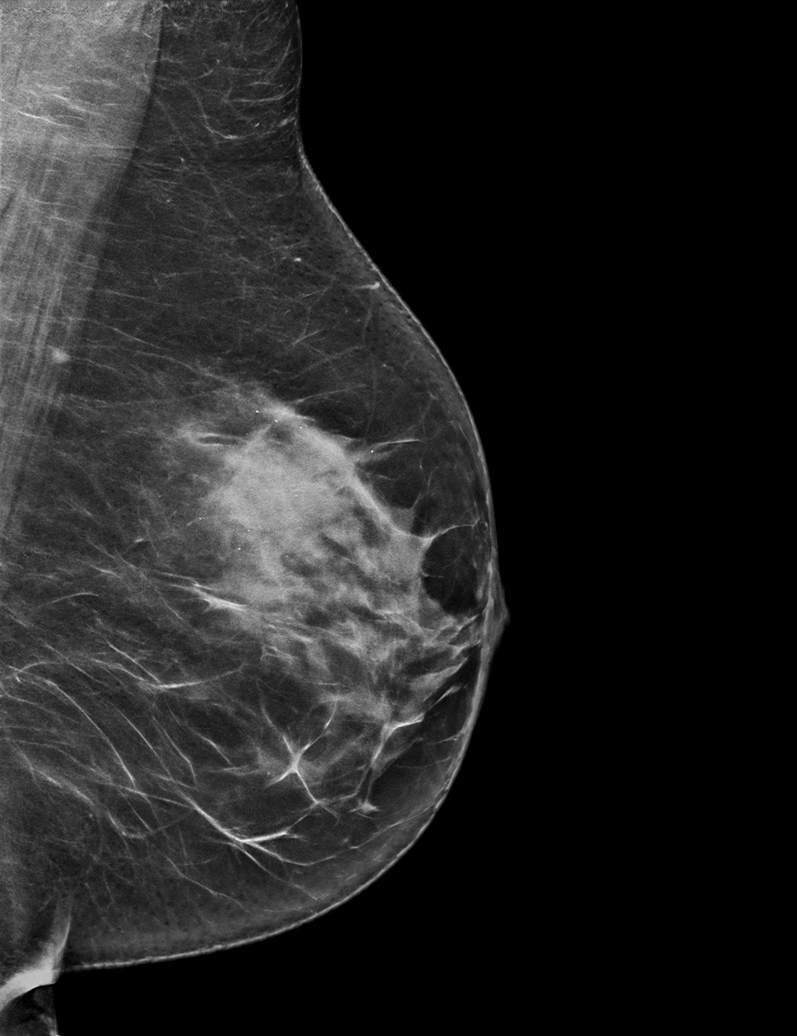

[R MLO tomo · tomo slice 41/82.0]
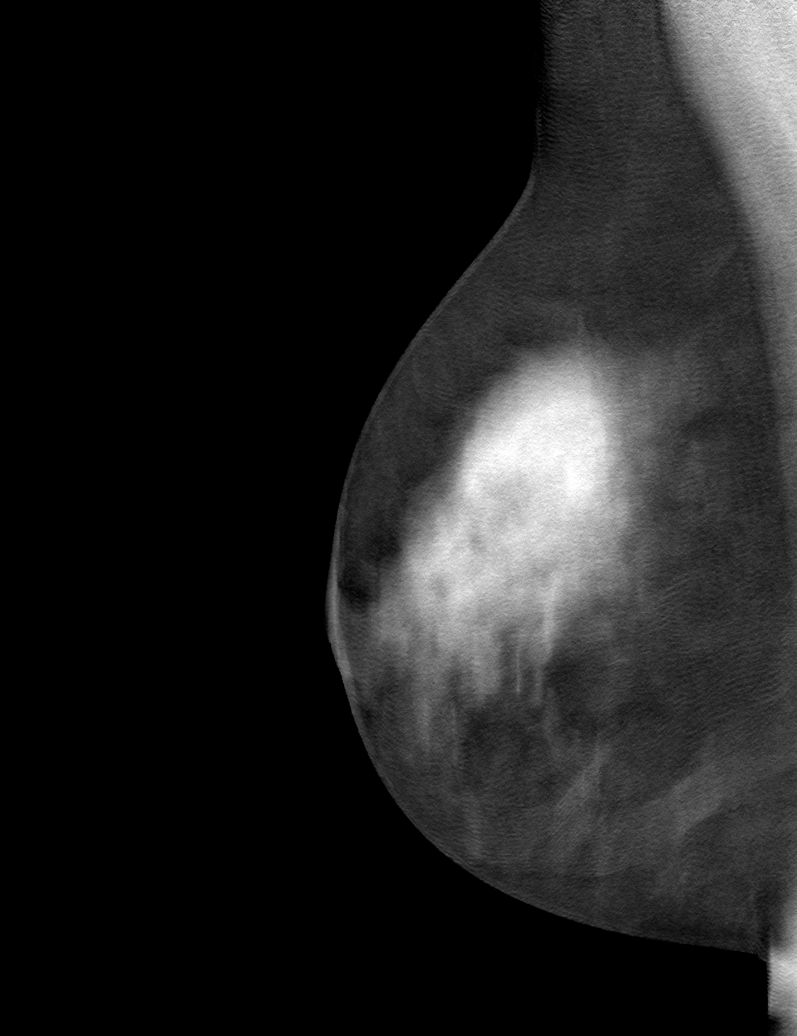

[6 of 30 positions shown; findings below may reference images not displayed]

ACR Breast Density Category d: The breast tissue is extremely dense,
which lowers the sensitivity of mammography
FINDINGS: There are no findings suspicious for malignancy. The images were
evaluated with computer-aided detection.
IMPRESSION: No mammographic evidence of malignancy. A result letter of this
screening mammogram will be mailed directly to the patient.

RECOMMENDATION:
Screening mammogram in one year. (Code:95-0-E9V)

BI-RADS CATEGORY  1: Negative.

## 2021-10-01 NOTE — Progress Notes (Signed)
I,Sulibeya S Dimas,acting as a Education administrator for Becky Sprout, FNP.,have documented all relevant documentation on the behalf of Becky Sprout, FNP,as directed by  Becky Sprout, FNP while in the presence of Becky Sprout, FNP.   Established patient visit   Patient: Becky Hubbard   DOB: [birth year]   51 y.o. Female  MRN: [id number] Visit Date: 10/09/2021  Today's healthcare provider: [name], FNP  RE Introduced to nurse practitioner role and practice setting.  All questions answered.  Discussed provider/patient relationship and expectations.   Chief Complaint  Patient presents with   Diabetes   Subjective    HPI  Diabetes Mellitus Type II, Follow-up  Lab Results  Component Value Date   HGBA1C 5.4 10/09/2021   HGBA1C 5.8 (H) 04/14/2021   HGBA1C 5.5 01/14/2021   Wt Readings from Last 3 Encounters:  10/09/21 176 lb 6.4 oz (80 kg)  08/07/21 176 lb (79.8 kg)  05/12/21 177 lb (80.3 kg)   Last seen for diabetes 5 months ago.  Management since then includes continue medication. She reports excellent compliance with treatment. She is not having side effects.  Symptoms: No fatigue No foot ulcerations  No appetite changes No nausea  No paresthesia of the feet  No polydipsia  No polyuria No visual disturbances   No vomiting     Home blood sugar records: fasting range: 120s-200s  Episodes of hypoglycemia? No    Current insulin regiment: none Most Recent Eye Exam: due for exam with Dr Ellin Mayhew; encouraged to call and make an appt Current exercise: none Current diet habits: well balanced  Pertinent Labs: Lab Results  Component Value Date   CHOL 162 04/14/2021   HDL 33 (L) 04/14/2021   LDLCALC 95 04/14/2021   TRIG 199 (H) 04/14/2021   CHOLHDL 5.7 (H) 04/25/2020   Lab Results  Component Value Date   NA 141 04/14/2021   K 4.1 04/14/2021   CREATININE 0.84 04/14/2021   EGFR 84 04/14/2021   MICROALBUR 10 07/04/2015   LABMICR <3.0 04/14/2021      ---------------------------------------------------------------------------------------------------   Medications: Outpatient Medications Prior to Visit  Medication Sig   albuterol (VENTOLIN HFA) 108 (90 Base) MCG/ACT inhaler Inhale 2 puffs into the lungs every 6 (six) hours as needed for wheezing or shortness of breath.   aspirin EC 81 MG EC tablet Take 1 tablet (81 mg total) by mouth daily. Swallow whole.   AUSTEDO 9 MG TABS Take 1 tablet by mouth 2 (two) times daily.   buPROPion (WELLBUTRIN SR) 200 MG 12 hr tablet Take 200 mg by mouth every morning.   DULoxetine (CYMBALTA) 60 MG capsule Take 1 capsule (60 mg total) by mouth daily.   EPINEPHrine (EPIPEN 2-PAK) 0.3 mg/0.3 mL IJ SOAJ injection Inject 0.3 mg into the muscle as needed for anaphylaxis.   EPINEPHrine 0.3 mg/0.3 mL IJ SOAJ injection Inject 0.3 mg into the muscle as needed for anaphylaxis.   fluticasone (FLONASE) 50 MCG/ACT nasal spray Place 2 sprays into both nostrils daily.   Fluticasone-Umeclidin-Vilant (TRELEGY ELLIPTA) 100-62.5-25 MCG/ACT AEPB Inhale 1 puff into the lungs daily.   haloperidol (HALDOL) 5 MG tablet Take 1 tablet (5 mg total) by mouth 2 (two) times daily.   hydrOXYzine (ATARAX/VISTARIL) 50 MG tablet TAKE ONE TABLET BY MOUTH 3 TIMES DAILY AS NEEDED (DOSAGE INCREASE)   LORazepam (ATIVAN) 1 MG tablet Take 1 mg by mouth 2 (two) times daily.   meloxicam (MOBIC) 15 MG tablet TAKE 1 TABLET  BY MOUTH DAILY. AVOID ADDITIONAL NSAIDS WITH USE. TAKE WITH FOOD.   metFORMIN (GLUCOPHAGE) 500 MG tablet Take 1 tablet (500 mg total) by mouth daily. with food   nicotine (NICODERM CQ - DOSED IN MG/24 HOURS) 21 mg/24hr patch Place 1 patch (21 mg total) onto the skin daily.   rosuvastatin (CRESTOR) 5 MG tablet Take 1 tablet (5 mg total) by mouth daily.   ziprasidone (GEODON) 80 MG capsule SMARTSIG:1 Tablet(s) By Mouth Every Evening   [DISCONTINUED] lisdexamfetamine (VYVANSE) 40 MG capsule Take 1 capsule (40 mg total) by mouth daily.    [DISCONTINUED] pantoprazole (PROTONIX) 40 MG tablet TAKE ONE TABLET EVERY DAY   [DISCONTINUED] Vitamin D, Ergocalciferol, (DRISDOL) 1.25 MG (50000 UNIT) CAPS capsule Take 1 capsule (50,000 Units total) by mouth every 7 (seven) days. (taking one tablet per week) walk in lab in office 1-2 weeks after completing prescription.   No facility-administered medications prior to visit.    Review of Systems  Last CBC Lab Results  Component Value Date   WBC 8.8 04/14/2021   HGB 14.6 04/14/2021   HCT 44.8 04/14/2021   MCV 87 04/14/2021   MCH 28.2 04/14/2021   RDW 13.0 04/14/2021   PLT 293 57/84/6962   Last metabolic panel Lab Results  Component Value Date   GLUCOSE 74 04/14/2021   NA 141 04/14/2021   K 4.1 04/14/2021   CL 105 04/14/2021   CO2 23 04/14/2021   BUN 9 04/14/2021   CREATININE 0.84 04/14/2021   EGFR 84 04/14/2021   CALCIUM 9.6 04/14/2021   PROT 6.6 04/14/2021   ALBUMIN 4.8 04/14/2021   LABGLOB 1.8 04/14/2021   AGRATIO 2.7 (H) 04/14/2021   BILITOT 0.4 04/14/2021   ALKPHOS 78 04/14/2021   AST 19 04/14/2021   ALT 24 04/14/2021   ANIONGAP 11 09/19/2020   Last lipids Lab Results  Component Value Date   CHOL 162 04/14/2021   HDL 33 (L) 04/14/2021   LDLCALC 95 04/14/2021   TRIG 199 (H) 04/14/2021   CHOLHDL 5.7 (H) 04/25/2020   Last hemoglobin A1c Lab Results  Component Value Date   HGBA1C 5.4 10/09/2021   Last thyroid functions Lab Results  Component Value Date   TSH 2.450 04/25/2020   Last vitamin D Lab Results  Component Value Date   VD25OH 49.9 04/25/2020   Last vitamin B12 and Folate Lab Results  Component Value Date   VITAMINB12 546 04/25/2020       Objective    BP (!) 90/56   Pulse (!) 112   Temp 98 F (36.7 C) (Oral)   Resp 16   Wt 176 lb 6.4 oz (80 kg)   BMI 28.91 kg/m   BP Readings from Last 3 Encounters:  10/09/21 (!) 90/56  08/07/21 124/68  05/12/21 110/70   Wt Readings from Last 3 Encounters:  10/09/21 176 lb 6.4 oz (80  kg)  08/07/21 176 lb (79.8 kg)  05/12/21 177 lb (80.3 kg)      Physical Exam Vitals and nursing note reviewed.  Constitutional:      General: She is not in acute distress.    Appearance: Normal appearance. She is overweight. She is not ill-appearing, toxic-appearing or diaphoretic.  HENT:     Head: Normocephalic and atraumatic.  Cardiovascular:     Rate and Rhythm: Regular rhythm. Tachycardia present.     Pulses: Normal pulses.     Heart sounds: Normal heart sounds. No murmur heard.    No friction rub. No gallop.  Pulmonary:     Effort: Pulmonary effort is normal. No respiratory distress.     Breath sounds: Normal breath sounds. No stridor. No wheezing, rhonchi or rales.  Chest:     Chest wall: No tenderness.  Musculoskeletal:        General: No swelling, tenderness, deformity or signs of injury. Normal range of motion.     Right lower leg: No edema.     Left lower leg: No edema.  Skin:    General: Skin is warm and dry.     Capillary Refill: Capillary refill takes less than 2 seconds.     Coloration: Skin is not jaundiced or pale.     Findings: No bruising, erythema, lesion or rash.  Neurological:     General: No focal deficit present.     Mental Status: She is alert and oriented to person, place, and time. Mental status is at baseline.     Cranial Nerves: No cranial nerve deficit.     Sensory: No sensory deficit.     Motor: No weakness.     Coordination: Coordination normal.  Psychiatric:        Mood and Affect: Mood is anxious.        Behavior: Behavior normal.        Thought Content: Thought content normal.        Judgment: Judgment normal.     Results for orders placed or performed in visit on 10/09/21  POCT glycosylated hemoglobin (Hb A1C)  Result Value Ref Range   Hemoglobin A1C 5.4 4.0 - 5.6 %   Est. 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 Smaller meal size, adding snacks. Choosing water as drink of choice and increasing purposeful exercise.         Other   Need for influenza vaccination    Consented; VIS made available; no immediate side effects following administration; plan to repeat annually        Relevant Orders   Flu Vaccine QUAD 73moIM (Fluarix, Fluzone & Alfiuria Quad PF) (Completed)   Need for zoster vaccination    Consented; VIS made available; no immediate side effects following administration       Relevant Orders   Varicella-zoster vaccine IM (Completed)     Return in about 4 weeks (around 11/06/2021) for HTN management, blood pressure check.      IVonna Kotyk FNP, have reviewed all documentation for this visit. The documentation on 10/09/21 for the exam, diagnosis, procedures, and orders are all accurate and complete.    EGwyneth Hubbard [phone](phone) [phone](fax)  [place]

## 2021-10-09 ENCOUNTER — Ambulatory Visit (INDEPENDENT_AMBULATORY_CARE_PROVIDER_SITE_OTHER): Payer: Medicaid Other | Admitting: Family Medicine

## 2021-10-09 ENCOUNTER — Encounter: Payer: Self-pay | Admitting: Family Medicine

## 2021-10-09 VITALS — BP 90/56 | HR 112 | Temp 98.0°F | Resp 16 | Wt 176.4 lb

## 2021-10-09 DIAGNOSIS — Z23 Encounter for immunization: Secondary | ICD-10-CM | POA: Diagnosis not present

## 2021-10-09 DIAGNOSIS — I9589 Other hypotension: Secondary | ICD-10-CM

## 2021-10-09 DIAGNOSIS — E861 Hypovolemia: Secondary | ICD-10-CM | POA: Diagnosis not present

## 2021-10-09 DIAGNOSIS — E1149 Type 2 diabetes mellitus with other diabetic neurological complication: Secondary | ICD-10-CM | POA: Diagnosis not present

## 2021-10-09 LAB — POCT GLYCOSYLATED HEMOGLOBIN (HGB A1C)
Est. average glucose Bld gHb Est-mCnc: 108
Hemoglobin A1C: 5.4 % (ref 4.0–5.6)

## 2021-10-09 NOTE — Assessment & Plan Note (Signed)
Chronic, improved Continue to recommend balanced, lower carb meals. Smaller meal size, adding snacks. Choosing water as drink of choice and increasing purposeful exercise.

## 2021-10-09 NOTE — Assessment & Plan Note (Addendum)
Acute, stable Denies dizziness or change in mental status Recommend 1 month follow up

## 2021-10-09 NOTE — Assessment & Plan Note (Signed)
Consented; VIS made available; no immediate side effects following administration

## 2021-10-09 NOTE — Assessment & Plan Note (Signed)
Consented; VIS made available; no immediate side effects following administration; plan to repeat annually   

## 2021-11-11 ENCOUNTER — Ambulatory Visit: Payer: Medicaid Other | Admitting: Family Medicine

## 2021-11-11 VITALS — BP 103/73 | HR 104

## 2021-11-11 DIAGNOSIS — R031 Nonspecific low blood-pressure reading: Secondary | ICD-10-CM | POA: Insufficient documentation

## 2021-11-11 NOTE — Progress Notes (Signed)
Nurse Visit. Patient was in just to recheck BP and heart rate. Patient coming back in 2 months to follow-up with PCP  Patient was not evaluated by me  She presented for BP and HR follow up  F/u as detailed above   Eulis Foster, MD  [facility]

## 2021-12-24 ENCOUNTER — Other Ambulatory Visit: Payer: Self-pay | Admitting: Family Medicine

## 2021-12-24 DIAGNOSIS — E119 Type 2 diabetes mellitus without complications: Secondary | ICD-10-CM

## 2022-01-12 ENCOUNTER — Other Ambulatory Visit: Payer: Self-pay | Admitting: Family Medicine

## 2022-01-12 DIAGNOSIS — E785 Hyperlipidemia, unspecified: Secondary | ICD-10-CM

## 2022-01-13 NOTE — Telephone Encounter (Signed)
Requested Prescriptions  Pending Prescriptions Disp Refills   rosuvastatin (CRESTOR) 5 MG tablet [Pharmacy Med Name: ROSUVASTATIN CALCIUM 5 MG TAB] 90 tablet     Sig: TAKE 1 TABLET BY MOUTH DAILY     Cardiovascular:  Antilipid - Statins 2 Failed - 01/12/2022 11:57 AM      Failed - Lipid Panel in normal range within the last 12 months    Cholesterol, Total  Date Value Ref Range Status  04/14/2021 162 100 - 199 mg/dL Final   Cholesterol Piccolo, Waived  Date Value Ref Range Status  07/31/2014 162 <200 mg/dL Final    Comment:                            Desirable                <200                         Borderline High      200- 239                         High                     >239    LDL Chol Calc (NIH)  Date Value Ref Range Status  04/14/2021 95 0 - 99 mg/dL Final   HDL  Date Value Ref Range Status  04/14/2021 33 (L) >39 mg/dL Final   Triglycerides  Date Value Ref Range Status  04/14/2021 199 (H) 0 - 149 mg/dL Final   Triglycerides Piccolo,Waived  Date Value Ref Range Status  07/31/2014 260 (H) <150 mg/dL Final    Comment:                            Normal                   <150                         Borderline High     150 - 199                         High                200 - 499                         Very High                >499          Passed - Cr in normal range and within 360 days    Creatinine, Ser  Date Value Ref Range Status  04/14/2021 0.84 0.57 - 1.00 mg/dL Final         Passed - Patient is not pregnant      Passed - Valid encounter within last 12 months    Recent Outpatient Visits           2 months ago Low blood pressure reading   [facility], [place], MD   3 months ago Type 2 diabetes mellitus with other neurologic complication, without long-term current use of insulin [facility])   [facility] [name], FNP   8 months  ago Hypertension associated with type 2 diabetes mellitus [facility])    [facility] Tally Joe T, FNP   9 months ago Type 2 diabetes mellitus with other neurologic complication, without long-term current use of insulin [facility])   [facility] Tally Joe T, FNP   12 months ago Type 2 diabetes mellitus without complication, without long-term current use of insulin [facility])   [facility] [name], FNP       Future Appointments             Tomorrow [name], [place], [facility]             meloxicam (MOBIC) 15 MG tablet [Pharmacy Med Name: MELOXICAM 15 MG TAB] 90 tablet 1    Sig: TAKE 1 TABLET BY MOUTH DAILY. AVOID ADDITIONAL NSAIDS WITH USE. TAKE WITH FOOD.     Analgesics:  COX2 Inhibitors Failed - 01/12/2022 11:57 AM      Failed - Manual Review: Labs are only required if the patient has taken medication for more than 8 weeks.      Passed - HGB in normal range and within 360 days    Hemoglobin  Date Value Ref Range Status  04/14/2021 14.6 11.1 - 15.9 g/dL Final         Passed - Cr in normal range and within 360 days    Creatinine, Ser  Date Value Ref Range Status  04/14/2021 0.84 0.57 - 1.00 mg/dL Final         Passed - HCT in normal range and within 360 days    Hematocrit  Date Value Ref Range Status  04/14/2021 44.8 34.0 - 46.6 % Final         Passed - AST in normal range and within 360 days    AST  Date Value Ref Range Status  04/14/2021 19 0 - 40 IU/L Final         Passed - ALT in normal range and within 360 days    ALT  Date Value Ref Range Status  04/14/2021 24 0 - 32 IU/L Final         Passed - eGFR is 30 or above and within 360 days    GFR calc Af Amer  Date Value Ref Range Status  07/05/2017 >60 >60 mL/min Final    Comment:    (NOTE) The eGFR has been calculated using the CKD EPI equation. This calculation has not been validated in all clinical situations. eGFR's persistently <60 mL/min signify possible Chronic Kidney Disease.    GFR, Estimated   Date Value Ref Range Status  09/19/2020 >60 >60 mL/min Final    Comment:    (NOTE) Calculated using the CKD-EPI Creatinine Equation (2021)    eGFR  Date Value Ref Range Status  04/14/2021 84 >59 mL/min/1.73 Final         Passed - Patient is not pregnant      Passed - Valid encounter within last 12 months    Recent Outpatient Visits           2 months ago Low blood pressure reading   [place], [place]   3 months ago Type 2 diabetes mellitus with other neurologic complication, without long-term current use of insulin [facility])   [facility] Tally Joe T, FNP   8 months ago Hypertension associated with type 2 diabetes mellitus [facility])   [facility] [name], FNP   9  months ago Type 2 diabetes mellitus with other neurologic complication, without long-term current use of insulin [facility])   [facility] Tally Joe T, FNP   12 months ago Type 2 diabetes mellitus without complication, without long-term current use of insulin [facility])   [facility] [name], FNP       Future Appointments             Tomorrow [name], [place], [facility]

## 2022-01-13 NOTE — Progress Notes (Signed)
Established patient visit   Patient: Becky Hubbard   DOB: 04-Mar-1970   51 y.o. Female  MRN: [id number] Visit Date: 01/14/2022  Today's healthcare provider: Jacky Kindle, FNP  Re Introduced to nurse practitioner role and practice setting.  All questions answered.  Discussed provider/patient relationship and expectations.  I,Tiffany J Bragg,acting as a scribe for Jacky Kindle, FNP.,have documented all relevant documentation on the behalf of Jacky Kindle, FNP,as directed by  Jacky Kindle, FNP while in the presence of Jacky Kindle, FNP.   Chief Complaint  Patient presents with   Hypotension    Patient is here for hypotension f/u from 2 months ago. Has no complaints, no changes to medications.    Subjective    HPI HPI     Hypotension    Additional comments: Patient is here for hypotension f/u from 2 months ago. Has no complaints, no changes to medications.       Last edited by Marlana Salvage, CMA on 01/14/2022  2:47 PM.      Medications: Outpatient Medications Prior to Visit  Medication Sig   albuterol (VENTOLIN HFA) 108 (90 Base) MCG/ACT inhaler Inhale 2 puffs into the lungs every 6 (six) hours as needed for wheezing or shortness of breath.   aspirin EC 81 MG EC tablet Take 1 tablet (81 mg total) by mouth daily. Swallow whole.   AUSTEDO 9 MG TABS Take 1 tablet by mouth 2 (two) times daily.   buPROPion (WELLBUTRIN SR) 200 MG 12 hr tablet Take 200 mg by mouth every morning.   DULoxetine (CYMBALTA) 60 MG capsule Take 1 capsule (60 mg total) by mouth daily.   EPINEPHrine (EPIPEN 2-PAK) 0.3 mg/0.3 mL IJ SOAJ injection Inject 0.3 mg into the muscle as needed for anaphylaxis.   EPINEPHrine 0.3 mg/0.3 mL IJ SOAJ injection Inject 0.3 mg into the muscle as needed for anaphylaxis.   fluticasone (FLONASE) 50 MCG/ACT nasal spray Place 2 sprays into both nostrils daily.   Fluticasone-Umeclidin-Vilant (TRELEGY ELLIPTA) 100-62.5-25 MCG/ACT AEPB Inhale 1 puff into the lungs daily.    haloperidol (HALDOL) 5 MG tablet Take 1 tablet (5 mg total) by mouth 2 (two) times daily.   hydrOXYzine (ATARAX/VISTARIL) 50 MG tablet TAKE ONE TABLET BY MOUTH 3 TIMES DAILY AS NEEDED (DOSAGE INCREASE)   LORazepam (ATIVAN) 1 MG tablet Take 1 mg by mouth 2 (two) times daily.   meloxicam (MOBIC) 15 MG tablet TAKE 1 TABLET BY MOUTH DAILY. AVOID ADDITIONAL NSAIDS WITH USE. TAKE WITH FOOD.   metFORMIN (GLUCOPHAGE) 500 MG tablet TAKE 1 TABLET BY MOUTH DAILY WITH FOOD   nicotine (NICODERM CQ - DOSED IN MG/24 HOURS) 21 mg/24hr patch Place 1 patch (21 mg total) onto the skin daily.   rosuvastatin (CRESTOR) 5 MG tablet Take 1 tablet (5 mg total) by mouth daily.   ziprasidone (GEODON) 80 MG capsule SMARTSIG:1 Tablet(s) By Mouth Every Evening   No facility-administered medications prior to visit.    Review of Systems    Objective    BP (!) 104/56 (BP Location: Right Arm, Patient Position: Sitting, Cuff Size: Normal)   Pulse (!) 111   Resp 16   Ht 5\' 5"  (1.651 m)   Wt 177 lb (80.3 kg)   SpO2 100%   BMI 29.45 kg/m   Physical Exam Vitals and nursing note reviewed.  Constitutional:      General: She is not in acute distress.    Appearance: Normal appearance. She is  overweight. She is not ill-appearing, toxic-appearing or diaphoretic.  HENT:     Head: Normocephalic and atraumatic.  Cardiovascular:     Rate and Rhythm: Normal rate and regular rhythm.     Pulses: Normal pulses.     Heart sounds: Normal heart sounds. No murmur heard.    No friction rub. No gallop.  Pulmonary:     Effort: Pulmonary effort is normal. No respiratory distress.     Breath sounds: Normal breath sounds. No stridor. No wheezing, rhonchi or rales.  Chest:     Chest wall: No tenderness.  Musculoskeletal:        General: No swelling, tenderness, deformity or signs of injury. Normal range of motion.     Right lower leg: No edema.     Left lower leg: No edema.  Skin:    General: Skin is warm and dry.     Capillary  Refill: Capillary refill takes less than 2 seconds.     Coloration: Skin is not jaundiced or pale.     Findings: No bruising, erythema, lesion or rash.  Neurological:     General: No focal deficit present.     Mental Status: She is alert and oriented to person, place, and time. Mental status is at baseline.     Cranial Nerves: No cranial nerve deficit.     Sensory: No sensory deficit.     Motor: No weakness.     Coordination: Coordination normal.  Psychiatric:        Mood and Affect: Mood normal. Affect is flat.        Behavior: Behavior normal.        Thought Content: Thought content normal.        Judgment: Judgment normal.     No results found for any visits on 01/14/22.  Assessment & Plan     Problem List Items Addressed This Visit       Cardiovascular and Mediastinum   Idiopathic hypotension    Chronic, improved Denies concerns Continue to monitor Seek emergent/urgent care if <90/<60 or symptomatic         Other   Tachycardia - Primary    Chronic, stable Likely s/s psychiatric medications Denies concerns with anxiety, agitation, insomnia Continue to monitor Seek consult with cards if >120 bmp sustained       Tobacco dependence    Chronic, unchanged Continue to recommend cessation; however, pt remains pre-contemplative       Return in about 3 months (around 04/15/2022) for annual examination.     Leilani Merl, FNP, have reviewed all documentation for this visit. The documentation on 01/14/22 for the exam, diagnosis, procedures, and orders are all accurate and complete.  Jacky Kindle, FNP  [facility] [phone] (phone) [phone] (fax)  [facility]

## 2022-01-14 ENCOUNTER — Encounter: Payer: Self-pay | Admitting: Family Medicine

## 2022-01-14 ENCOUNTER — Ambulatory Visit (INDEPENDENT_AMBULATORY_CARE_PROVIDER_SITE_OTHER): Payer: Medicaid Other | Admitting: Family Medicine

## 2022-01-14 VITALS — BP 104/56 | HR 111 | Resp 16 | Ht 65.0 in | Wt 177.0 lb

## 2022-01-14 DIAGNOSIS — I950 Idiopathic hypotension: Secondary | ICD-10-CM | POA: Diagnosis not present

## 2022-01-14 DIAGNOSIS — F17200 Nicotine dependence, unspecified, uncomplicated: Secondary | ICD-10-CM | POA: Diagnosis not present

## 2022-01-14 DIAGNOSIS — R000 Tachycardia, unspecified: Secondary | ICD-10-CM

## 2022-01-14 NOTE — Assessment & Plan Note (Signed)
Chronic, unchanged Continue to recommend cessation; however, pt remains pre-contemplative

## 2022-01-14 NOTE — Assessment & Plan Note (Signed)
Chronic, stable Likely s/s psychiatric medications Denies concerns with anxiety, agitation, insomnia Continue to monitor Seek consult with cards if >120 bmp sustained

## 2022-01-14 NOTE — Assessment & Plan Note (Signed)
Chronic, improved Denies concerns Continue to monitor Seek emergent/urgent care if <90/<60 or symptomatic

## 2022-03-17 ENCOUNTER — Ambulatory Visit: Payer: Medicaid Other | Admitting: Internal Medicine

## 2022-03-18 ENCOUNTER — Ambulatory Visit: Payer: Medicaid Other | Attending: Internal Medicine | Admitting: Nurse Practitioner

## 2022-03-18 ENCOUNTER — Encounter: Payer: Self-pay | Admitting: Nurse Practitioner

## 2022-03-18 VITALS — BP 100/68 | HR 94 | Ht 65.5 in | Wt 178.1 lb

## 2022-03-18 DIAGNOSIS — R000 Tachycardia, unspecified: Secondary | ICD-10-CM | POA: Diagnosis present

## 2022-03-18 DIAGNOSIS — E1149 Type 2 diabetes mellitus with other diabetic neurological complication: Secondary | ICD-10-CM

## 2022-03-18 DIAGNOSIS — E1169 Type 2 diabetes mellitus with other specified complication: Secondary | ICD-10-CM | POA: Diagnosis present

## 2022-03-18 DIAGNOSIS — R002 Palpitations: Secondary | ICD-10-CM

## 2022-03-18 DIAGNOSIS — E785 Hyperlipidemia, unspecified: Secondary | ICD-10-CM

## 2022-03-18 DIAGNOSIS — I950 Idiopathic hypotension: Secondary | ICD-10-CM | POA: Diagnosis present

## 2022-03-18 DIAGNOSIS — F17200 Nicotine dependence, unspecified, uncomplicated: Secondary | ICD-10-CM | POA: Diagnosis present

## 2022-03-18 NOTE — Progress Notes (Signed)
Office Visit    Patient Name: Becky Hubbard Date of Encounter: 03/18/2022  Primary Care Provider:  [name], FNP Primary Cardiologist:  Werner Lean, MD  Chief Complaint    52 year old female with a history of tobacco abuse, COPD, pulmonary nodules, diabetes, chest pain, palpitations, depression, anxiety, fibromyalgia, GERD, OCD, and PTSD, who presents for follow-up related to tachycardia.  Past Medical History    Past Medical History:  Diagnosis Date   ADD (attention deficit disorder)    Anxiety    Arthritis    Auditory hallucinations    Carpal tunnel syndrome on both sides    Cervical spine fracture ([place]) 2008   Closed with screw C2-3 [facility] Dr. Delilah Shan   Chest pain    a. 11/2019 Echo: EF 60-65%, no rwma, nl RV fxn; b. 03/2021 CT Chest: No Cor Ca2+.   Degenerative joint disease of spine    Depression    Diabetes mellitus without complication ([place])    Family history of adverse reaction to anesthesia    mom- slow to awaken   Fibromyalgia    Foot fracture, left    Gastroparesis    Gastroparesis    GERD (gastroesophageal reflux disease)    Hearing loss    Herniated cervical disc    History of prescription drug abuse    Lung nodules    a. 03/2021 CT Chest: Tiny perifissural nodules noted in the left lung measuring up to 5.8 mm and likely subpleural lymph nodes.  No suspicious nodes or masses.   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   NOT seen on exam 04/10/14   Plaque psoriasis    PTSD (post-traumatic stress disorder)    Stroke [facility])    Ulcer    Past Surgical History:  Procedure Laterality Date   CARPAL TUNNEL RELEASE Left 07/12/2015   Procedure: OPEN CARPAL TUNNEL RELEASE OF LEFT HAND;  Surgeon: Leanor Kail, MD;  Location: [facility];  Service: Orthopedics;  Laterality: Left;   cervical radiofrequency neurotomy     FRACTURE SURGERY     MOUTH SURGERY     upper and lower dentures   NASAL SEPTUM SURGERY     occipital nerve blocks     SPINE SURGERY     cervical   Allergies  Allergies  Allergen Reactions   Doxycycline    Lyrica [Pregabalin] Swelling    Ankles swell and turn red   Neurontin [Gabapentin]    Chantix [Varenicline] Rash   Prednisone Other (See Comments)    Makes her crazy   Other Other (See Comments)    Cats - cough/sneezing   Penicillin G Hives   Penicillins Hives    Has patient had a PCN reaction causing immediate rash, facial/tongue/throat swelling, SOB or lightheadedness with hypotension: No Has patient had a PCN reaction causing severe rash involving mucus membranes or skin necrosis: No Has patient had a PCN reaction that required hospitalization No Has patient had a PCN reaction occurring within the last 10 years: No If all of the above answers are "NO", then may proceed with Cephalosporin use.    Sulfa Antibiotics Hives, Rash and Other (See Comments)    History of Present Illness    52 year old female with history of tobacco abuse, COPD,  pulmonary nodules, diabetes, chest pain, palpitations, depression, anxiety, prior myalgia, GERD, OCD, and PTSD.  She was prev eval by [facility] cardiology in 12/2018 following ED visit for atypical chest pain.  She was scheduled for cor CTA, but never followed through.  She subsequently established care w/ Dr. Gasper Sells in 10/2019 for atypical chest pain and palpitations.  Echo was performed and showed nl EF w/o rwma.  Notes indicated that a Zio would be ordered, but this does not appear to have been carried out.  Since her last visit in 2021, she notes that she has done well.  She exercises on the treadmill for 45 minutes 3 times a week.  She denies any recurrence of tachypalpitations and also denies chest pain, dyspnea, PND, orthopnea, dizziness, syncope,  edema, or early satiety.  Home Medications    Current Outpatient Medications  Medication Sig Dispense Refill   albuterol (VENTOLIN HFA) 108 (90 Base) MCG/ACT inhaler Inhale 2 puffs into the lungs every 6 (six) hours as needed for wheezing or shortness of breath. 8 g 0   aspirin EC 81 MG EC tablet Take 1 tablet (81 mg total) by mouth daily. Swallow whole. 30 tablet 1   AUSTEDO 9 MG TABS Take 1 tablet by mouth 2 (two) times daily.     buPROPion (WELLBUTRIN SR) 200 MG 12 hr tablet Take 200 mg by mouth every morning.     DULoxetine (CYMBALTA) 60 MG capsule Take 1 capsule (60 mg total) by mouth daily. 30 capsule 1   fluticasone (FLONASE) 50 MCG/ACT nasal spray Place 2 sprays into both nostrils daily.     Fluticasone-Umeclidin-Vilant (TRELEGY ELLIPTA) 100-62.5-25 MCG/ACT AEPB Inhale 1 puff into the lungs daily.     haloperidol (HALDOL) 5 MG tablet Take 1 tablet (5 mg total) by mouth 2 (two) times daily. 60 tablet 1   hydrOXYzine (ATARAX/VISTARIL) 50 MG tablet TAKE ONE TABLET BY MOUTH 3 TIMES DAILY AS NEEDED (DOSAGE INCREASE)     LORazepam (ATIVAN) 1 MG tablet Take 1 mg by mouth 2 (two) times daily.     meloxicam (MOBIC) 15 MG tablet TAKE 1 TABLET BY MOUTH DAILY. AVOID ADDITIONAL NSAIDS WITH USE. TAKE WITH FOOD. 90 tablet 1   metFORMIN (GLUCOPHAGE) 500 MG tablet TAKE 1 TABLET BY MOUTH DAILY WITH FOOD 90 tablet 3   nicotine (NICODERM CQ - DOSED IN MG/24 HOURS) 21 mg/24hr patch Place 1 patch (21 mg total) onto the skin daily. 29 patch 11   rosuvastatin (CRESTOR) 5 MG tablet Take 1 tablet (5 mg total) by mouth daily. 90 tablet 3   ziprasidone (GEODON) 80 MG capsule SMARTSIG:1 Tablet(s) By Mouth Every Evening     EPINEPHrine (EPIPEN 2-PAK) 0.3 mg/0.3 mL IJ SOAJ injection Inject 0.3 mg into the muscle as needed for anaphylaxis. (Patient not taking: Reported on 03/18/2022) 1 each 1   EPINEPHrine 0.3 mg/0.3 mL IJ SOAJ injection Inject 0.3 mg into the muscle as needed for anaphylaxis. (Patient not taking:  Reported on 03/18/2022) 1 each 0   No current facility-administered medications for this visit.     Review of Systems    She denies chest pain, palpitations, dyspnea, pnd, orthopnea, n, v, dizziness, syncope, edema, weight gain, or early satiety.  All other systems reviewed and are otherwise negative except as noted above.    Physical Exam    VS:  BP 100/68 (BP Location: Right Arm, Patient Position: Sitting, Cuff Size: Normal)   Pulse 94   Ht 5' 5.5" (1.664 m)   [employer] [facility]  178 lb 2 oz (80.8 kg)   SpO2 98%   BMI 29.19 kg/m  , BMI Body mass index is 29.19 kg/m.     GEN: Well nourished, well developed, in no acute distress. HEENT: normal. Neck: Supple, no JVD, carotid bruits, or masses. Cardiac: RRR, no murmurs, rubs, or gallops. No clubbing, cyanosis, edema.  Radials 2+/PT 2+ and equal bilaterally.  Respiratory:  Respirations regular and unlabored, clear to auscultation bilaterally. GI: Soft, nontender, nondistended, BS + x 4. MS: no deformity or atrophy. Skin: warm and dry, no rash. Neuro:  Strength and sensation are intact. Psych: Normal affect.  Accessory Clinical Findings    ECG personally reviewed by me today -regular sinus rhythm, 94- no acute changes.  Lab Results  Component Value Date   WBC 8.8 04/14/2021   HGB 14.6 04/14/2021   HCT 44.8 04/14/2021   MCV 87 04/14/2021   PLT 293 04/14/2021   Lab Results  Component Value Date   CREATININE 0.84 04/14/2021   BUN 9 04/14/2021   NA 141 04/14/2021   K 4.1 04/14/2021   CL 105 04/14/2021   CO2 23 04/14/2021   Lab Results  Component Value Date   ALT 24 04/14/2021   AST 19 04/14/2021   ALKPHOS 78 04/14/2021   BILITOT 0.4 04/14/2021   Lab Results  Component Value Date   CHOL 162 04/14/2021   HDL 33 (L) 04/14/2021   LDLCALC 95 04/14/2021   TRIG 199 (H) 04/14/2021   CHOLHDL 5.7 (H) 04/25/2020    Lab Results  Component Value Date   HGBA1C 5.4 10/09/2021    Assessment & Plan    1.  Palpitations/tachycardia:  Quiescent without recent occurrence.  Overall feeling well.  No further workup warranted at this time.  2.  History of atypical chest pain: No recent symptoms.  Exercises regularly without limitations.  No further workup warranted at this time.  3.  Tobacco abuse: Still smoking.  Not currently considering quitting.  Cessation advised.  4.  Type 2 diabetes mellitus: A1c 5.4 last August.  Managed with metformin by primary care.  5.  Hyperlipidemia: LDL of 95 in March of last year.  Managed by primary care.  6.  History of hypotension: Blood pressure soft but stable at 100/70.  No presyncope or syncope.    7.  Disposition: Follow-up with cardiology as needed.   Murray Hodgkins, NP 03/18/2022, 6:03 PM

## 2022-03-18 NOTE — Patient Instructions (Signed)
Medication Instructions:  No medicine changes today *If you need a refill on your cardiac medications before your next appointment, please call your pharmacy*   Lab Work: None required today If you have labs (blood work) drawn today and your tests are completely normal, you will receive your results only by: [place] (if you have MyChart) OR A paper copy in the mail If you have any lab test that is abnormal or we need to change your treatment, we will call you to review the results.   Testing/Procedures: None   Follow-Up: At [facility], you and your health needs are our priority.  As part of our continuing mission to provide you with exceptional heart care, we have created designated Provider Care Teams.  These Care Teams include your primary Cardiologist (physician) and Advanced Practice Providers (APPs -  Physician Assistants and Nurse Practitioners) who all work together to provide you with the care you need, when you need it.  We recommend signing up for the patient portal called "MyChart".  Sign up information is provided on this After Visit Summary.  MyChart is used to connect with patients for Virtual Visits (Telemedicine).  Patients are able to view lab/test results, encounter notes, upcoming appointments, etc.  Non-urgent messages can be sent to your provider as well.   To learn more about what you can do with MyChart, go to [email or web address].    Your next appointment:   As Needed  Provider:   Werner Lean, MD in [place] or Ignacia Bayley, NP in [place]    Other Instructions None

## 2022-04-02 ENCOUNTER — Ambulatory Visit: Payer: Medicaid Other

## 2022-04-03 ENCOUNTER — Ambulatory Visit
Admission: RE | Admit: 2022-04-03 | Discharge: 2022-04-03 | Disposition: A | Discharge status: Home / Self Care | Payer: Medicaid Other | Source: Ambulatory Visit | Attending: Acute Care | Admitting: Acute Care

## 2022-04-03 DIAGNOSIS — Z87891 Personal history of nicotine dependence: Secondary | ICD-10-CM | POA: Diagnosis present

## 2022-04-03 DIAGNOSIS — F17210 Nicotine dependence, cigarettes, uncomplicated: Secondary | ICD-10-CM | POA: Insufficient documentation

## 2022-04-06 ENCOUNTER — Other Ambulatory Visit: Payer: Self-pay | Admitting: Acute Care

## 2022-04-06 DIAGNOSIS — Z122 Encounter for screening for malignant neoplasm of respiratory organs: Secondary | ICD-10-CM

## 2022-04-06 DIAGNOSIS — Z87891 Personal history of nicotine dependence: Secondary | ICD-10-CM

## 2022-04-06 DIAGNOSIS — F17210 Nicotine dependence, cigarettes, uncomplicated: Secondary | ICD-10-CM

## 2022-04-14 ENCOUNTER — Ambulatory Visit (INDEPENDENT_AMBULATORY_CARE_PROVIDER_SITE_OTHER): Payer: Medicaid Other | Admitting: Family Medicine

## 2022-04-14 ENCOUNTER — Encounter: Payer: Self-pay | Admitting: Family Medicine

## 2022-04-14 VITALS — BP 120/75 | HR 95 | Temp 98.8°F | Ht 65.5 in | Wt 177.0 lb

## 2022-04-14 DIAGNOSIS — R7303 Prediabetes: Secondary | ICD-10-CM | POA: Diagnosis not present

## 2022-04-14 DIAGNOSIS — J432 Centrilobular emphysema: Secondary | ICD-10-CM | POA: Diagnosis not present

## 2022-04-14 DIAGNOSIS — F330 Major depressive disorder, recurrent, mild: Secondary | ICD-10-CM | POA: Diagnosis not present

## 2022-04-14 DIAGNOSIS — Z1231 Encounter for screening mammogram for malignant neoplasm of breast: Secondary | ICD-10-CM

## 2022-04-14 DIAGNOSIS — Z0000 Encounter for general adult medical examination without abnormal findings: Secondary | ICD-10-CM

## 2022-04-14 DIAGNOSIS — I152 Hypertension secondary to endocrine disorders: Secondary | ICD-10-CM

## 2022-04-14 DIAGNOSIS — F17200 Nicotine dependence, unspecified, uncomplicated: Secondary | ICD-10-CM | POA: Diagnosis not present

## 2022-04-14 MED ORDER — TRELEGY ELLIPTA 100-62.5-25 MCG/ACT IN AEPB: 1.0000 | INHALATION_SPRAY | Freq: Every day | RESPIRATORY_TRACT | 11 refills | Status: DC

## 2022-04-14 NOTE — Assessment & Plan Note (Signed)
Chronic, unchanged UTD on lung cancer screening LDCT

## 2022-04-14 NOTE — Progress Notes (Signed)
Complete physical exam   Patient: Becky Hubbard   DOB: 03-07-1970   52 y.o. Female  MRN: [id number] Visit Date: 04/14/2022  Today's healthcare provider: [name], FNP  Re Introduced to nurse practitioner role and practice setting.  All questions answered.  Discussed provider/patient relationship and expectations.  Chief Complaint  Patient presents with   Annual Exam   Subjective    Becky Hubbard is a 52 y.o. female who presents today for a complete physical exam.  She reports consuming a general diet. The patient does not participate in regular exercise at present. She generally feels well. She reports sleeping fairly well. She does have additional problems to discuss today.  HPI    Past Medical History:  Diagnosis Date   ADD (attention deficit disorder)    Anxiety    Arthritis    Auditory hallucinations    Carpal tunnel syndrome on both sides    Cervical spine fracture ([place]) 2008   Closed with screw C2-3 [facility] Dr. Delilah Shan   Chest pain    a. 11/2019 Echo: EF 60-65%, no rwma, nl RV fxn; b. 03/2021 CT Chest: No Cor Ca2+.   Degenerative joint disease of spine    Depression    Family history of adverse reaction to anesthesia    mom- slow to awaken   Fibromyalgia    Foot fracture, left    Gastroparesis    Gastroparesis    GERD (gastroesophageal reflux disease)    Hearing loss    Herniated cervical disc    History of prescription drug abuse    Lung nodules    a. 03/2021 CT Chest: Tiny perifissural nodules noted in the left lung measuring up to 5.8 mm and likely subpleural lymph nodes.  No suspicious nodes or masses.   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   NOT seen on exam 04/10/14   Plaque psoriasis    PTSD (post-traumatic stress disorder)    Stroke [facility])     Ulcer    Past Surgical History:  Procedure Laterality Date   CARPAL TUNNEL RELEASE Left 07/12/2015   Procedure: OPEN CARPAL TUNNEL RELEASE OF LEFT HAND;  Surgeon: Leanor Kail, MD;  Location: [place];  Service: Orthopedics;  Laterality: Left;   cervical radiofrequency neurotomy     FRACTURE SURGERY     MOUTH SURGERY     upper and lower dentures   NASAL SEPTUM SURGERY     occipital nerve blocks     SPINE SURGERY     cervical   Social History   Socioeconomic History   Marital status: Divorced    Spouse name: Not on file   Number of children: Not on file   Years of education: Not on file   Highest education level: Not on file  Occupational History   Not on file  Tobacco Use   Smoking status: Every Day    Years: 20.00    Types: Cigarettes    Start date: 03/01/2011   Smokeless tobacco: Never   Tobacco comments:    1PPD 08/07/2021  Vaping Use   Vaping Use: Never used  Substance and Sexual Activity   Alcohol use: No   Drug use: No   Sexual activity: Yes    Birth control/protection: None  Other Topics Concern   Not on file  Social History Narrative   Depression- history of suicide attempt and hospitalization about 1998 after divorce   H/O hospitalization at [facility] for narcotics detox/withdrawal approx. 2000   Social Determinants of Health   Financial Resource Strain: Not on file  Food Insecurity: Not on file  Transportation Needs: Not on file  Physical Activity: Not on file  Stress: Not on file  Social Connections: Not on file  Intimate Partner Violence: Not on file   Family Status  Relation Name Status   Mother  Alive   Father  Deceased   Brother Faye Ramsay   MGF  Deceased   PGF  Deceased   Mat Aunt  Deceased   MGM  Deceased   [place]  Deceased   Brother Murlean Iba   Sister [place]  (Not Specified)   Family History  Problem Relation Age of Onset   Cancer Mother 59       breast cancer    Migraines Mother    Hypertension Mother    Arthritis Mother    Sleep apnea Mother    Anxiety disorder Mother    Depression Mother    Breast cancer Mother    Diabetes Father    Cancer Father 64       of spine , pancreas and liver   Anxiety disorder Father    Depression Father    Diabetes Brother    Hyperlipidemia Brother    Sleep apnea Brother    Cancer Maternal Grandfather        leukemia, prostate   Cancer Paternal Grandfather        bone   Cancer Maternal Aunt        breast   Mental illness Maternal Grandmother        Dementia   Stroke Paternal Grandmother    Diabetes Brother    Cancer Paternal Aunt        breast   Allergies  Allergen Reactions   Doxycycline    Lyrica [Pregabalin] Swelling    Ankles swell and turn red   Neurontin [Gabapentin]    Chantix [Varenicline] Rash   Prednisone Other (See Comments)    Makes her crazy   Other Other (See Comments)    Cats - cough/sneezing   Penicillin G Hives   Penicillins Hives    Has patient had a PCN reaction causing immediate rash, facial/tongue/throat swelling, SOB or lightheadedness with hypotension: No Has patient had a PCN reaction causing severe rash involving mucus membranes or skin necrosis: No Has patient had a PCN reaction that required hospitalization No Has patient had a PCN reaction occurring within the last 10 years: No If all of the above answers are "NO", then may proceed with Cephalosporin use.    Sulfa Antibiotics Hives, Rash and Other (See Comments)    Patient Care Team: [name], FNP as PCP - General (Family Medicine) Werner Lean, MD as PCP - Cardiology (Cardiology)   Medications: Outpatient Medications Prior to Visit  Medication Sig   albuterol (VENTOLIN HFA) 108 (90 Base) MCG/ACT inhaler Inhale 2 puffs into the lungs every 6 (six) hours as needed for wheezing or shortness of breath.   aspirin EC 81 MG EC tablet Take 1 tablet (81 mg total) by mouth daily. Swallow whole.    AUSTEDO 9 MG TABS Take 1 tablet by mouth 2 (two) times daily.   buPROPion (  WELLBUTRIN SR) 200 MG 12 hr tablet Take 200 mg by mouth every morning.   DULoxetine (CYMBALTA) 60 MG capsule Take 1 capsule (60 mg total) by mouth daily.   EPINEPHrine (EPIPEN 2-PAK) 0.3 mg/0.3 mL IJ SOAJ injection Inject 0.3 mg into the muscle as needed for anaphylaxis.   EPINEPHrine 0.3 mg/0.3 mL IJ SOAJ injection Inject 0.3 mg into the muscle as needed for anaphylaxis.   fluticasone (FLONASE) 50 MCG/ACT nasal spray Place 2 sprays into both nostrils daily.   haloperidol (HALDOL) 5 MG tablet Take 1 tablet (5 mg total) by mouth 2 (two) times daily.   hydrOXYzine (ATARAX/VISTARIL) 50 MG tablet TAKE ONE TABLET BY MOUTH 3 TIMES DAILY AS NEEDED (DOSAGE INCREASE)   LORazepam (ATIVAN) 1 MG tablet Take 1 mg by mouth 2 (two) times daily.   meloxicam (MOBIC) 15 MG tablet TAKE 1 TABLET BY MOUTH DAILY. AVOID ADDITIONAL NSAIDS WITH USE. TAKE WITH FOOD.   metFORMIN (GLUCOPHAGE) 500 MG tablet TAKE 1 TABLET BY MOUTH DAILY WITH FOOD   nicotine (NICODERM CQ - DOSED IN MG/24 HOURS) 21 mg/24hr patch Place 1 patch (21 mg total) onto the skin daily.   rosuvastatin (CRESTOR) 5 MG tablet Take 1 tablet (5 mg total) by mouth daily.   ziprasidone (GEODON) 80 MG capsule SMARTSIG:1 Tablet(s) By Mouth Every Evening   [DISCONTINUED] Fluticasone-Umeclidin-Vilant (TRELEGY ELLIPTA) 100-62.5-25 MCG/ACT AEPB Inhale 1 puff into the lungs daily.   No facility-administered medications prior to visit.    Review of Systems  HENT:  Positive for rhinorrhea and sneezing.   Musculoskeletal:  Positive for myalgias, neck pain and neck stiffness.  Psychiatric/Behavioral:  The patient is nervous/anxious.     Objective    BP 120/75 (BP Location: Right Arm, Patient Position: Sitting, Cuff Size: Normal)   Pulse 95   Temp 98.8 F (37.1 C)   Ht 5' 5.5" (1.664 m)   Wt 177 lb (80.3 kg)   SpO2 98%   BMI 29.01 kg/m   Physical Exam Vitals and nursing note  reviewed.  Constitutional:      General: She is awake. She is not in acute distress.    Appearance: Normal appearance. She is well-developed, well-groomed and overweight. She is not ill-appearing, toxic-appearing or diaphoretic.  HENT:     Head: Normocephalic and atraumatic.     Jaw: There is normal jaw occlusion. No trismus, tenderness, swelling or pain on movement.     Right Ear: Hearing, tympanic membrane, ear canal and external ear normal. There is no impacted cerumen.     Left Ear: Hearing, tympanic membrane, ear canal and external ear normal. There is no impacted cerumen.     Nose: Nose normal. No congestion or rhinorrhea.     Right Turbinates: Not enlarged, swollen or pale.     Left Turbinates: Not enlarged, swollen or pale.     Right Sinus: No maxillary sinus tenderness or frontal sinus tenderness.     Left Sinus: No maxillary sinus tenderness or frontal sinus tenderness.     Mouth/Throat:     Lips: Pink.     Mouth: Mucous membranes are moist. No injury.     Tongue: No lesions.     Pharynx: Oropharynx is clear. Uvula midline. No pharyngeal swelling, oropharyngeal exudate, posterior oropharyngeal erythema or uvula swelling.     Tonsils: No tonsillar exudate or tonsillar abscesses.  Eyes:     General: Lids are normal. Lids are everted, no foreign bodies appreciated. Vision grossly intact. Gaze aligned appropriately. No allergic shiner  or visual field deficit.       Right eye: No discharge.        Left eye: No discharge.     Extraocular Movements: Extraocular movements intact.     Conjunctiva/sclera: Conjunctivae normal.     Right eye: Right conjunctiva is not injected. No exudate.    Left eye: Left conjunctiva is not injected. No exudate.    Pupils: Pupils are equal, round, and reactive to light.  Neck:     Thyroid: No thyroid mass, thyromegaly or thyroid tenderness.     Vascular: No carotid bruit.     Trachea: Trachea normal.  Cardiovascular:     Rate and Rhythm: Normal rate  and regular rhythm.     Pulses: Normal pulses.          Carotid pulses are 2+ on the right side and 2+ on the left side.      Radial pulses are 2+ on the right side and 2+ on the left side.       Dorsalis pedis pulses are 2+ on the right side and 2+ on the left side.       Posterior tibial pulses are 2+ on the right side and 2+ on the left side.     Heart sounds: Normal heart sounds, S1 normal and S2 normal. No murmur heard.    No friction rub. No gallop.  Pulmonary:     Effort: Pulmonary effort is normal. No respiratory distress.     Breath sounds: Normal breath sounds and air entry. No stridor. No wheezing, rhonchi or rales.  Chest:     Chest wall: No tenderness.     Comments: Breasts: breasts appear normal, no suspicious masses, no skin or nipple changes or axillary nodes, symmetric fibrous changes in both upper outer quadrants, right breast normal without mass, skin or nipple changes or axillary nodes, left breast normal without mass, skin or nipple changes or axillary nodes, risk and benefit of breast self-exam was discussed  Abdominal:     General: Abdomen is flat. Bowel sounds are normal. There is no distension.     Palpations: Abdomen is soft. There is no mass.     Tenderness: There is no abdominal tenderness. There is no right CVA tenderness, left CVA tenderness, guarding or rebound.     Hernia: No hernia is present.  Genitourinary:    Comments: Exam deferred; denies complaints Musculoskeletal:        General: No swelling, tenderness, deformity or signs of injury. Normal range of motion.     Cervical back: Full passive range of motion without pain, normal range of motion and neck supple. No edema, rigidity or tenderness. No muscular tenderness.     Right lower leg: No edema.     Left lower leg: No edema.  Lymphadenopathy:     Cervical: No cervical adenopathy.     Right cervical: No superficial, deep or posterior cervical adenopathy.    Left cervical: No superficial, deep or  posterior cervical adenopathy.  Skin:    General: Skin is warm and dry.     Capillary Refill: Capillary refill takes less than 2 seconds.     Coloration: Skin is not jaundiced or pale.     Findings: No bruising, erythema, lesion or rash.  Neurological:     General: No focal deficit present.     Mental Status: She is alert and oriented to person, place, and time. Mental status is at baseline.     GCS: GCS eye  subscore is 4. GCS verbal subscore is 5. GCS motor subscore is 6.     Sensory: Sensation is intact. No sensory deficit.     Motor: Motor function is intact. No weakness.     Coordination: Coordination is intact. Coordination normal.     Gait: Gait is intact. Gait normal.  Psychiatric:        Attention and Perception: Attention and perception normal.        Mood and Affect: Mood and affect normal.        Speech: Speech normal.        Behavior: Behavior normal. Behavior is cooperative.        Thought Content: Thought content normal.        Cognition and Memory: Cognition and memory normal.        Judgment: Judgment normal.     Last depression screening scores    04/14/2022    3:03 PM 01/14/2022    2:48 PM 10/09/2021    3:35 PM  PHQ 2/9 Scores  PHQ - 2 Score 0 2 4  PHQ- 9 Score '2 8 9   '$ Last fall risk screening    04/14/2022    3:03 PM  [place] in the past year? 0  Number falls in past yr: 0  Injury with Fall? 0   Last Audit-C alcohol use screening    04/14/2022    3:03 PM  Alcohol Use Disorder Test (AUDIT)  1. How often do you have a drink containing alcohol? 0  3. How often do you have six or more drinks on one occasion? 0   A score of 3 or more in women, and 4 or more in men indicates increased risk for alcohol abuse, EXCEPT if all of the points are from question 1   No results found for any visits on 04/14/22.  Assessment & Plan    Routine Health Maintenance and Physical Exam  Exercise Activities and Dietary recommendations  Goals   None      Immunization History  Administered Date(s) Administered   Influenza,inj,Quad PF,6+ Mos 01/14/2021, 10/09/2021   Influenza-Unspecified 04/09/2014, 11/04/2015, 10/25/2018   Pneumococcal Polysaccharide-23 12/19/2008   Tdap 07/31/2014   Zoster Recombinat (Shingrix) 05/12/2021, 10/09/2021    Health Maintenance  Topic Date Due   COVID-19 Vaccine (1) Never done   Diabetic kidney evaluation - eGFR measurement  04/15/2022   Diabetic kidney evaluation - Urine ACR  04/15/2022   Lung Cancer Screening  04/04/2023   Fecal DNA (Cologuard)  05/15/2023   MAMMOGRAM  06/07/2023   PAP SMEAR-Modifier  10/14/2023   DTaP/Tdap/Td (2 - Td or Tdap) 07/30/2024   INFLUENZA VACCINE  Completed   Hepatitis C Screening  Completed   HIV Screening  Completed   Zoster Vaccines- Shingrix  Completed   HPV VACCINES  Aged Out    Discussed health benefits of physical activity, and encouraged her to engage in regular exercise appropriate for her age and condition.  Problem List Items Addressed This Visit       Cardiovascular and Mediastinum   Hypertension associated with type 2 diabetes mellitus ([place])    Chronic, at goal Continue to monitor- has been stable off medication Continue to recommend low sodium diet and purposeful exercise        Respiratory   Centrilobular emphysema (HCC)    Chronic, stable Request for refills of trelegy Continues to smoke 1 ppd; denies assistance in reduction for goal of cessation  Relevant Medications   Fluticasone-Umeclidin-Vilant (TRELEGY ELLIPTA) 100-62.5-25 MCG/ACT AEPB     Other   Annual physical exam - Primary    UTD on vision; due for dental Things to do to keep yourself healthy  - Exercise at least 30-45 minutes a day, 3-4 days a week.  - Eat a low-fat diet with lots of fruits and vegetables, up to 7-9 servings per day.  - Seatbelts can save your life. Wear them always.  - Smoke detectors on every level of your home, check batteries every year.  - Eye  Doctor - have an eye exam every 1-2 years  - Safe sex - if you may be exposed to STDs, use a condom.  - Alcohol -  If you drink, do it moderately, less than 2 drinks per day.  - [place]. Choose someone to speak for you if you are not able.  - Depression is common in our stressful world.If you're feeling down or losing interest in things you normally enjoy, please come in for a visit.  - Violence - If anyone is threatening or hurting you, please call immediately.       Relevant Orders   Comprehensive metabolic panel   Lipid panel   TSH + free T4   CBC with Differential/Platelet   Prediabetes    Chronic, well controlled previously Continues on 500 mg metformin daily Repeat A1c      Relevant Orders   Hemoglobin A1c   Recurrent depressive disorder, current episode mild (HCC)    Chronic, well controlled No e/o psychosis F/b psych      Screening mammogram, encounter for    Due for screening for mammogram, denies breast concerns, provided with phone number to call and schedule appointment for mammogram. Encouraged to repeat breast cancer screening every 1-2 years.       Relevant Orders   MM 3D SCREENING MAMMOGRAM BILATERAL BREAST   Tobacco dependence    Chronic, unchanged UTD on lung cancer screening LDCT      Return in about 6 months (around 10/15/2022) for chonic disease management.    Vonna Kotyk, FNP, have reviewed all documentation for this visit. The documentation on 04/14/22 for the exam, diagnosis, procedures, and orders are all accurate and complete.  [name], [place] [phone] (phone) [phone] (fax)  [place]

## 2022-04-14 NOTE — Assessment & Plan Note (Signed)
Chronic, stable Request for refills of trelegy Continues to smoke 1 ppd; denies assistance in reduction for goal of cessation

## 2022-04-14 NOTE — Assessment & Plan Note (Signed)
Chronic, at goal Continue to monitor- has been stable off medication Continue to recommend low sodium diet and purposeful exercise

## 2022-04-14 NOTE — Assessment & Plan Note (Signed)
Chronic, well controlled previously Continues on 500 mg metformin daily Repeat A1c

## 2022-04-14 NOTE — Assessment & Plan Note (Signed)
Due for screening for mammogram, denies breast concerns, provided with phone number to call and schedule appointment for mammogram. Encouraged to repeat breast cancer screening every 1-2 years.  

## 2022-04-14 NOTE — Assessment & Plan Note (Signed)
Chronic, well controlled No e/o psychosis F/b psych

## 2022-04-14 NOTE — Assessment & Plan Note (Signed)
UTD on vision; due for dental Things to do to keep yourself healthy  - Exercise at least 30-45 minutes a day, 3-4 days a week.  - Eat a low-fat diet with lots of fruits and vegetables, up to 7-9 servings per day.  - Seatbelts can save your life. Wear them always.  - Smoke detectors on every level of your home, check batteries every year.  - Eye Doctor - have an eye exam every 1-2 years  - Safe sex - if you may be exposed to STDs, use a condom.  - Alcohol -  If you drink, do it moderately, less than 2 drinks per day.  - [place]. Choose someone to speak for you if you are not able.  - Depression is common in our stressful world.If you're feeling down or losing interest in things you normally enjoy, please come in for a visit.  - Violence - If anyone is threatening or hurting you, please call immediately.

## 2022-04-14 NOTE — Patient Instructions (Addendum)
The CDC recommends two doses of Shingrix (the new shingles vaccine) separated by 2 to 6 months for adults age 53 years and older. I recommend checking with your insurance plan regarding coverage for this vaccine.    Please call and schedule your mammogram:  [facility]  [place], [place],  [place]  [id number] Get Driving Directions Main: [phone]  Sunday:Closed Monday:7:20 AM - 5:00 PM Tuesday:7:20 AM - 5:00 PM Wednesday:7:20 AM - 5:00 PM Thursday:7:20 AM - 5:00 PM Friday:7:20 AM - 4:30 PM Saturday:Closed

## 2022-04-15 ENCOUNTER — Other Ambulatory Visit: Payer: Self-pay | Admitting: Family Medicine

## 2022-04-15 ENCOUNTER — Telehealth: Payer: Self-pay | Admitting: Family Medicine

## 2022-04-15 LAB — TSH+FREE T4
Free T4: 0.93 ng/dL (ref 0.82–1.77)
TSH: 2.12 u[IU]/mL (ref 0.450–4.500)

## 2022-04-15 LAB — CBC WITH DIFFERENTIAL/PLATELET
Basophils Absolute: 0.1 10*3/uL (ref 0.0–0.2)
Basos: 1 %
EOS (ABSOLUTE): 0.4 10*3/uL (ref 0.0–0.4)
Eos: 4 %
Hematocrit: 44.8 % (ref 34.0–46.6)
Hemoglobin: 14.8 g/dL (ref 11.1–15.9)
Immature Grans (Abs): 0 10*3/uL (ref 0.0–0.1)
Immature Granulocytes: 0 %
Lymphocytes Absolute: 2.6 10*3/uL (ref 0.7–3.1)
Lymphs: 25 %
MCH: 28.5 pg (ref 26.6–33.0)
MCHC: 33 g/dL (ref 31.5–35.7)
MCV: 86 fL (ref 79–97)
Monocytes Absolute: 0.7 10*3/uL (ref 0.1–0.9)
Monocytes: 7 %
Neutrophils Absolute: 6.4 10*3/uL (ref 1.4–7.0)
Neutrophils: 63 %
Platelets: 275 10*3/uL (ref 150–450)
RBC: 5.19 x10E6/uL (ref 3.77–5.28)
RDW: 12.7 % (ref 11.7–15.4)
WBC: 10.2 10*3/uL (ref 3.4–10.8)

## 2022-04-15 LAB — COMPREHENSIVE METABOLIC PANEL
ALT: 37 IU/L — ABNORMAL HIGH (ref 0–32)
AST: 24 IU/L (ref 0–40)
Albumin/Globulin Ratio: 2.2 (ref 1.2–2.2)
Albumin: 4.8 g/dL (ref 3.8–4.9)
Alkaline Phosphatase: 81 IU/L (ref 44–121)
BUN/Creatinine Ratio: 16 (ref 9–23)
BUN: 11 mg/dL (ref 6–24)
Bilirubin Total: 0.4 mg/dL (ref 0.0–1.2)
CO2: 23 mmol/L (ref 20–29)
Calcium: 10.4 mg/dL — ABNORMAL HIGH (ref 8.7–10.2)
Chloride: 102 mmol/L (ref 96–106)
Creatinine, Ser: 0.7 mg/dL (ref 0.57–1.00)
Globulin, Total: 2.2 g/dL (ref 1.5–4.5)
Glucose: 86 mg/dL (ref 70–99)
Potassium: 3.9 mmol/L (ref 3.5–5.2)
Sodium: 141 mmol/L (ref 134–144)
Total Protein: 7 g/dL (ref 6.0–8.5)
eGFR: 104 mL/min/{1.73_m2} (ref >59)

## 2022-04-15 LAB — LIPID PANEL
Chol/HDL Ratio: 3.4 ratio (ref 0.0–4.4)
Cholesterol, Total: 124 mg/dL (ref 100–199)
HDL: 36 mg/dL — ABNORMAL LOW (ref >39)
LDL Chol Calc (NIH): 63 mg/dL (ref 0–99)
Triglycerides: 141 mg/dL (ref 0–149)
VLDL Cholesterol Cal: 25 mg/dL (ref 5–40)

## 2022-04-15 LAB — HEMOGLOBIN A1C
Est. average glucose Bld gHb Est-mCnc: 126 mg/dL
Hgb A1c MFr Bld: 6 % — ABNORMAL HIGH (ref 4.8–5.6)

## 2022-04-15 MED ORDER — METFORMIN HCL ER 750 MG PO TB24: 750.0000 mg | ORAL_TABLET | Freq: Two times a day (BID) | ORAL | 1 refills | Status: DC

## 2022-04-15 NOTE — Progress Notes (Signed)
Borderline elevation in calcium and ALT/liver enzyme. Recommend recheck BMP in 3-6 months.  Pre-diabetes is elevated at 6%; highest in 2 years. Continue to recommend balanced, lower carb meals. Smaller meal size, adding snacks. Choosing water as drink of choice and increasing purposeful exercise.  Recommend increase metformin to twice/day dosing. New Rx sent.  All other labs are normal and stable.

## 2022-04-15 NOTE — Telephone Encounter (Signed)
Received a fax from covermymeds for Trelegy Ellipta  Key:  [id number]

## 2022-04-17 ENCOUNTER — Other Ambulatory Visit: Payer: Self-pay | Admitting: Family Medicine

## 2022-04-17 DIAGNOSIS — E1169 Type 2 diabetes mellitus with other specified complication: Secondary | ICD-10-CM

## 2022-04-17 DIAGNOSIS — E785 Hyperlipidemia, unspecified: Secondary | ICD-10-CM

## 2022-04-17 MED ORDER — ROSUVASTATIN CALCIUM 5 MG PO TABS: 5.0000 mg | ORAL_TABLET | Freq: Every day | ORAL | 3 refills | Status: DC

## 2022-04-27 NOTE — Telephone Encounter (Signed)
PA created through [facility] Track waiting on the outcome

## 2022-04-30 NOTE — Telephone Encounter (Signed)
Pt's mother is calling in checking on the status of Fluticasone-Umeclidin-Vilant (TRELEGY ELLIPTA) 100-62.5-25 MCG/ACT AEPB [id number] . Says she would like someone to follow up with her regarding the status and what is holding up the medication. Please advise.

## 2022-04-30 NOTE — Telephone Encounter (Signed)
Called [facility] track to check on status. Per pharmacist Trelegy Ellipta approved from 04/27/2022-10/14/2022. [id number] and Ref# [id number]  Called [facility] and they rerun the medication and per pharmacist at [facility] medication went through.  Called patient and LM advising.

## 2022-05-20 ENCOUNTER — Ambulatory Visit: Payer: Medicaid Other | Admitting: Podiatry

## 2022-05-20 ENCOUNTER — Encounter: Payer: Self-pay | Admitting: Podiatry

## 2022-05-20 DIAGNOSIS — D492 Neoplasm of unspecified behavior of bone, soft tissue, and skin: Secondary | ICD-10-CM | POA: Diagnosis not present

## 2022-05-20 DIAGNOSIS — D2371 Other benign neoplasm of skin of right lower limb, including hip: Secondary | ICD-10-CM

## 2022-05-20 DIAGNOSIS — M79676 Pain in unspecified toe(s): Secondary | ICD-10-CM | POA: Diagnosis not present

## 2022-05-20 DIAGNOSIS — B351 Tinea unguium: Secondary | ICD-10-CM

## 2022-05-20 DIAGNOSIS — E1142 Type 2 diabetes mellitus with diabetic polyneuropathy: Secondary | ICD-10-CM

## 2022-05-20 DIAGNOSIS — D2372 Other benign neoplasm of skin of left lower limb, including hip: Secondary | ICD-10-CM | POA: Diagnosis not present

## 2022-05-20 NOTE — Progress Notes (Signed)
Presents today for diabetic follow-up she has a history of diabetic peripheral neuropathy though she states that is not bothering her at this time.  States her last A1c was 6.0.  She would like to have her toenails trimmed.  Objective: Toenails are long thick yellow dystrophic and mycotic pulses are strong and palpable.  Assessment: Pain in limb secondary to onychomycosis diabetes mellitus and diabetic peripheral neuropathy.  Plan: Debridement of toenails 1 through 5 bilaterally follow-up with her as needed

## 2022-06-04 NOTE — Progress Notes (Deleted)
      Established patient visit   Patient: Becky Hubbard   DOB: 1970/04/26   52 y.o. Female  MRN: [id number] Visit Date: 06/05/2022  Today's healthcare provider: Jacky Kindle, FNP   No chief complaint on file.  Subjective    HPI    Medications: Outpatient Medications Prior to Visit  Medication Sig   metFORMIN (GLUCOPHAGE-XR) 750 MG 24 hr tablet Take 1 tablet (750 mg total) by mouth in the morning and at bedtime.   albuterol (VENTOLIN HFA) 108 (90 Base) MCG/ACT inhaler Inhale 2 puffs into the lungs every 6 (six) hours as needed for wheezing or shortness of breath.   aspirin EC 81 MG EC tablet Take 1 tablet (81 mg total) by mouth daily. Swallow whole.   AUSTEDO 9 MG TABS Take 1 tablet by mouth 2 (two) times daily.   buPROPion (WELLBUTRIN SR) 200 MG 12 hr tablet Take 200 mg by mouth every morning.   DULoxetine (CYMBALTA) 60 MG capsule Take 1 capsule (60 mg total) by mouth daily.   EPINEPHrine (EPIPEN 2-PAK) 0.3 mg/0.3 mL IJ SOAJ injection Inject 0.3 mg into the muscle as needed for anaphylaxis.   EPINEPHrine 0.3 mg/0.3 mL IJ SOAJ injection Inject 0.3 mg into the muscle as needed for anaphylaxis.   fluticasone (FLONASE) 50 MCG/ACT nasal spray Place 2 sprays into both nostrils daily.   Fluticasone-Umeclidin-Vilant (TRELEGY ELLIPTA) 100-62.5-25 MCG/ACT AEPB Inhale 1 puff into the lungs daily.   haloperidol (HALDOL) 5 MG tablet Take 1 tablet (5 mg total) by mouth 2 (two) times daily.   hydrOXYzine (ATARAX/VISTARIL) 50 MG tablet TAKE ONE TABLET BY MOUTH 3 TIMES DAILY AS NEEDED (DOSAGE INCREASE)   LORazepam (ATIVAN) 1 MG tablet Take 1 mg by mouth 2 (two) times daily.   meloxicam (MOBIC) 15 MG tablet TAKE 1 TABLET BY MOUTH DAILY. AVOID ADDITIONAL NSAIDS WITH USE. TAKE WITH FOOD.   nicotine (NICODERM CQ - DOSED IN MG/24 HOURS) 21 mg/24hr patch Place 1 patch (21 mg total) onto the skin daily.   rosuvastatin (CRESTOR) 5 MG tablet Take 1 tablet (5 mg total) by mouth daily.   ziprasidone (GEODON)  80 MG capsule SMARTSIG:1 Tablet(s) By Mouth Every Evening   No facility-administered medications prior to visit.    Review of Systems  {Labs  Heme  Chem  Endocrine  Serology  Results Review (optional):23779}   Objective    There were no vitals taken for this visit. {Show previous vital signs (optional):23777}  Physical Exam  ***  No results found for any visits on 06/05/22.  Assessment & Plan     ***  No follow-ups on file.      {provider attestation***:1}   Jacky Kindle, FNP  [facility] [phone] (phone) [phone] (fax)  [facility]

## 2022-06-05 ENCOUNTER — Telehealth: Payer: Self-pay

## 2022-06-05 ENCOUNTER — Telehealth (INDEPENDENT_AMBULATORY_CARE_PROVIDER_SITE_OTHER): Payer: Medicaid Other | Admitting: Family Medicine

## 2022-06-05 DIAGNOSIS — L2082 Flexural eczema: Secondary | ICD-10-CM | POA: Diagnosis not present

## 2022-06-05 MED ORDER — TRIAMCINOLONE ACETONIDE 0.5 % EX OINT: 1.0000 | TOPICAL_OINTMENT | Freq: Two times a day (BID) | CUTANEOUS | 0 refills | Status: DC

## 2022-06-05 NOTE — Assessment & Plan Note (Signed)
Acute, stable Unknown trigger Recommend topical steroid RTC as needed

## 2022-06-05 NOTE — Progress Notes (Signed)
I,J'ya E Hunter,acting as a scribe for Jacky Kindle, FNP.,have documented all relevant documentation on the behalf of Jacky Kindle, FNP,as directed by  Jacky Kindle, FNP while in the presence of Jacky Kindle, FNP.  MyChart Video Visit    Virtual Visit via Video Note   This format is felt to be most appropriate for this patient at this time. Physical exam was limited by quality of the video and audio technology used for the visit.   Patient location: Home Provider location: [facility]  I discussed the limitations of evaluation and management by telemedicine and the availability of in person appointments. The patient expressed understanding and agreed to proceed.  Patient: Becky Hubbard   DOB: 1970/08/27   52 y.o. Female  MRN: [id number] Visit Date: 06/05/2022  Today's healthcare provider: Jacky Kindle, FNP   Chief Complaint  Patient presents with   Eczema   Subjective    Eczema: Patient complains of rash. Onset of symptoms a month ago, and have been rapidly worsening since that time. The affected locations include left hand, right hand, and left elbow. The rash is characterized by itchiness, redness, dryness, and peeling. Associated symptoms include nail changes (redness). Treatment modalities that have been used in the past include: Hydrocortisone cream and soaking in hydrogen peroxide with mild relief.     Medications: Outpatient Medications Prior to Visit  Medication Sig   albuterol (VENTOLIN HFA) 108 (90 Base) MCG/ACT inhaler Inhale 2 puffs into the lungs every 6 (six) hours as needed for wheezing or shortness of breath.   aspirin EC 81 MG EC tablet Take 1 tablet (81 mg total) by mouth daily. Swallow whole.   AUSTEDO 9 MG TABS Take 1 tablet by mouth 2 (two) times daily.   buPROPion (WELLBUTRIN SR) 200 MG 12 hr tablet Take 200 mg by mouth every morning.   DULoxetine (CYMBALTA) 60 MG capsule Take 1 capsule (60 mg total) by mouth daily.   EPINEPHrine  (EPIPEN 2-PAK) 0.3 mg/0.3 mL IJ SOAJ injection Inject 0.3 mg into the muscle as needed for anaphylaxis.   EPINEPHrine 0.3 mg/0.3 mL IJ SOAJ injection Inject 0.3 mg into the muscle as needed for anaphylaxis.   fluticasone (FLONASE) 50 MCG/ACT nasal spray Place 2 sprays into both nostrils daily.   Fluticasone-Umeclidin-Vilant (TRELEGY ELLIPTA) 100-62.5-25 MCG/ACT AEPB Inhale 1 puff into the lungs daily.   haloperidol (HALDOL) 5 MG tablet Take 1 tablet (5 mg total) by mouth 2 (two) times daily.   hydrOXYzine (ATARAX/VISTARIL) 50 MG tablet TAKE ONE TABLET BY MOUTH 3 TIMES DAILY AS NEEDED (DOSAGE INCREASE)   LORazepam (ATIVAN) 1 MG tablet Take 1 mg by mouth 2 (two) times daily.   meloxicam (MOBIC) 15 MG tablet TAKE 1 TABLET BY MOUTH DAILY. AVOID ADDITIONAL NSAIDS WITH USE. TAKE WITH FOOD.   metFORMIN (GLUCOPHAGE-XR) 750 MG 24 hr tablet Take 1 tablet (750 mg total) by mouth in the morning and at bedtime.   nicotine (NICODERM CQ - DOSED IN MG/24 HOURS) 21 mg/24hr patch Place 1 patch (21 mg total) onto the skin daily.   rosuvastatin (CRESTOR) 5 MG tablet Take 1 tablet (5 mg total) by mouth daily.   ziprasidone (GEODON) 80 MG capsule SMARTSIG:1 Tablet(s) By Mouth Every Evening   No facility-administered medications prior to visit.    Review of Systems  Skin:  Positive for rash.    Objective    There were no vitals taken for this visit.  Physical Exam Constitutional:  Appearance: Normal appearance.  Pulmonary:     Effort: Pulmonary effort is normal.  Skin:    Findings: Erythema and rash present.     Comments: Bilateral hands, c/w eczema   Neurological:     General: No focal deficit present.     Mental Status: She is alert and oriented to person, place, and time.  Psychiatric:        Mood and Affect: Mood normal.        Behavior: Behavior normal.        Thought Content: Thought content normal.        Judgment: Judgment normal.      Assessment & Plan     Problem List Items  Addressed This Visit       Musculoskeletal and Integument   Flexural eczema - Primary    Acute, stable Unknown trigger Recommend topical steroid RTC as needed       No follow-ups on file.    I discussed the assessment and treatment plan with the patient. The patient was provided an opportunity to ask questions and all were answered. The patient agreed with the plan and demonstrated an understanding of the instructions.   The patient was advised to call back or seek an in-person evaluation if the symptoms worsen or if the condition fails to improve as anticipated.  I provided 10 minutes of face-to-face time during this encounter discussing eczema flare and plan of care.  Leilani Merl, FNP, have reviewed all documentation for this visit. The documentation on 06/05/22 for the exam, diagnosis, procedures, and orders are all accurate and complete.  Jacky Kindle, FNP [facility] [phone] (phone) [phone] (fax)  [facility]

## 2022-06-05 NOTE — Telephone Encounter (Signed)
Patient has 3:20PM virtual visit with FNP Merita Norton

## 2022-06-18 ENCOUNTER — Ambulatory Visit
Admission: RE | Admit: 2022-06-18 | Discharge: 2022-06-18 | Disposition: A | Discharge status: Home / Self Care | Payer: Medicaid Other | Source: Ambulatory Visit | Attending: Family Medicine | Admitting: Family Medicine

## 2022-06-18 DIAGNOSIS — Z1231 Encounter for screening mammogram for malignant neoplasm of breast: Secondary | ICD-10-CM | POA: Insufficient documentation

## 2022-06-22 NOTE — Progress Notes (Signed)
Hi Michon  Normal mammogram; repeat in 1 year.  Please let us know if you have any questions.  Thank you,  Sylas Twombly, FNP 

## 2022-06-29 ENCOUNTER — Other Ambulatory Visit: Payer: Self-pay | Admitting: Family Medicine

## 2022-08-29 ENCOUNTER — Other Ambulatory Visit: Payer: Self-pay | Admitting: Family Medicine

## 2022-10-16 ENCOUNTER — Other Ambulatory Visit (INDEPENDENT_AMBULATORY_CARE_PROVIDER_SITE_OTHER): Payer: MEDICAID | Admitting: Family Medicine

## 2022-10-18 LAB — HEMOGLOBIN A1C
Est. average glucose Bld gHb Est-mCnc: 126 mg/dL
Hgb A1c MFr Bld: 6 % — ABNORMAL HIGH (ref 4.8–5.6)

## 2022-10-18 LAB — COMPREHENSIVE METABOLIC PANEL
ALT: 19 IU/L (ref 0–32)
AST: 16 IU/L (ref 0–40)
Albumin: 4.6 g/dL (ref 3.8–4.9)
Alkaline Phosphatase: 75 IU/L (ref 44–121)
BUN/Creatinine Ratio: 11 (ref 9–23)
BUN: 9 mg/dL (ref 6–24)
Bilirubin Total: 0.4 mg/dL (ref 0.0–1.2)
CO2: 23 mmol/L (ref 20–29)
Calcium: 9.6 mg/dL (ref 8.7–10.2)
Chloride: 105 mmol/L (ref 96–106)
Creatinine, Ser: 0.79 mg/dL (ref 0.57–1.00)
Globulin, Total: 2.2 g/dL (ref 1.5–4.5)
Glucose: 120 mg/dL — ABNORMAL HIGH (ref 70–99)
Potassium: 3.5 mmol/L (ref 3.5–5.2)
Sodium: 143 mmol/L (ref 134–144)
Total Protein: 6.8 g/dL (ref 6.0–8.5)
eGFR: 89 mL/min/{1.73_m2} (ref >59)

## 2022-10-18 LAB — MICROALBUMIN / CREATININE URINE RATIO
Creatinine, Urine: 114.6 mg/dL
Microalb/Creat Ratio: 8 mg/g{creat} (ref 0–29)
Microalbumin, Urine: 9.3 ug/mL

## 2022-11-25 ENCOUNTER — Telehealth: Payer: Self-pay

## 2022-11-25 NOTE — Telephone Encounter (Unsigned)
Copied from CRM [id number]. Topic: General - Other >> Nov 25, 2022  2:49 PM Macon Large wrote: Reason for CRM: Pt called to request lab results. Pt stated she is unable to get [facility] to her [facility] and would like for a nurse to return her call. Cb# [phone]

## 2022-11-26 ENCOUNTER — Other Ambulatory Visit: Payer: Self-pay | Admitting: Family Medicine

## 2022-11-26 NOTE — Telephone Encounter (Signed)
Requested Prescriptions  Pending Prescriptions Disp Refills   metFORMIN (GLUCOPHAGE-XR) 750 MG 24 hr tablet [Pharmacy Med Name: METFORMIN HCL ER 750 MG TAB] 180 tablet 0    Sig: TAKE ONE TABLET BY MOUTH EVERY MORNING AND AT BEDTIME     Endocrinology:  Diabetes - Biguanides Failed - 11/26/2022 10:00 AM      Failed - B12 Level in normal range and within 720 days    Vitamin B-12  Date Value Ref Range Status  04/25/2020 546 232 - 1,245 pg/mL Final         Passed - Cr in normal range and within 360 days    Creatinine, Ser  Date Value Ref Range Status  10/16/2022 0.79 0.57 - 1.00 mg/dL Final         Passed - HBA1C is between 0 and 7.9 and within 180 days    HB A1C (BAYER DCA - WAIVED)  Date Value Ref Range Status  07/04/2015 5.6 <7.0 % Final    Comment:                                          Diabetic Adult            <7.0                                       Healthy Adult        4.3 - 5.7                                                           (DCCT/NGSP) American Diabetes Association's Summary of Glycemic Recommendations for Adults with Diabetes: Hemoglobin A1c <7.0%. More stringent glycemic goals (A1c <6.0%) may further reduce complications at the cost of increased risk of hypoglycemia.    Hgb A1c MFr Bld  Date Value Ref Range Status  10/16/2022 6.0 (H) 4.8 - 5.6 % Final    Comment:             Prediabetes: 5.7 - 6.4          Diabetes: >6.4          Glycemic control for adults with diabetes: <7.0          Passed - eGFR in normal range and within 360 days    GFR calc Af Amer  Date Value Ref Range Status  07/05/2017 >60 >60 mL/min Final    Comment:    (NOTE) The eGFR has been calculated using the CKD EPI equation. This calculation has not been validated in all clinical situations. eGFR's persistently <60 mL/min signify possible Chronic Kidney Disease.    GFR, Estimated  Date Value Ref Range Status  09/19/2020 >60 >60 mL/min Final    Comment:     (NOTE) Calculated using the CKD-EPI Creatinine Equation (2021)    eGFR  Date Value Ref Range Status  10/16/2022 89 >59 mL/min/1.73 Final         Passed - Valid encounter within last 6 months    Recent Outpatient Visits           5 months ago Flexural eczema  [facility] Merita Norton T, FNP   7 months ago Annual physical exam   [facility] Jacky Kindle, FNP   10 months ago Tachycardia   [facility] Merita Norton T, FNP   1 year ago Low blood pressure reading   [place] [facility] Simmons-Robinson, [place], MD   1 year ago Type 2 diabetes mellitus with other neurologic complication, without long-term current use of insulin [facility])   [facility] Merita Norton T, FNP              Passed - CBC within normal limits and completed in the last 12 months    WBC  Date Value Ref Range Status  04/14/2022 10.2 3.4 - 10.8 x10E3/uL Final  09/19/2020 8.1 4.0 - 10.5 K/uL Final   RBC  Date Value Ref Range Status  04/14/2022 5.19 3.77 - 5.28 x10E6/uL Final  09/19/2020 4.93 3.87 - 5.11 MIL/uL Final   Hemoglobin  Date Value Ref Range Status  04/14/2022 14.8 11.1 - 15.9 g/dL Final   Hematocrit  Date Value Ref Range Status  04/14/2022 44.8 34.0 - 46.6 % Final   MCHC  Date Value Ref Range Status  04/14/2022 33.0 31.5 - 35.7 g/dL Final  16/11/9602 54.0 30.0 - 36.0 g/dL Final   [facility]  Date Value Ref Range Status  04/14/2022 28.5 26.6 - 33.0 pg Final  09/19/2020 29.2 26.0 - 34.0 pg Final   MCV  Date Value Ref Range Status  04/14/2022 86 79 - 97 fL Final   No results found for: "PLTCOUNTKUC", "LABPLAT", "POCPLA" RDW  Date Value Ref Range Status  04/14/2022 12.7 11.7 - 15.4 % Final

## 2022-11-27 ENCOUNTER — Telehealth: Payer: Self-pay | Admitting: *Deleted

## 2022-11-27 NOTE — Telephone Encounter (Signed)
  Chief Complaint: Results Symptoms: NA Frequency: NA Pertinent Negatives: Patient denies NA Disposition: [] ED /[] Urgent Care (no appt availability in office) / [] Appointment(In office/virtual)/ []  [facility]/ [] Home Care/ [] Refused Recommended Disposition /[] [facility]/ []  Follow-up with PCP Additional Notes:  Pt returned call for results. Reviewed with pt. verbalizes understanding.      Prediabetes remains stable at 6%.   Liver enzymes are improved.   Continue to recommend balanced, lower carb meals. Smaller meal size, adding snacks. Choosing water as drink of choice and increasing purposeful exercise.

## 2022-11-30 NOTE — Telephone Encounter (Signed)
Patient aware of results from 10/16/2022. Verbalizes understanding and no further questions.

## 2023-01-11 ENCOUNTER — Other Ambulatory Visit: Payer: Self-pay | Admitting: Family Medicine

## 2023-01-11 DIAGNOSIS — E1169 Type 2 diabetes mellitus with other specified complication: Secondary | ICD-10-CM

## 2023-01-11 DIAGNOSIS — E785 Hyperlipidemia, unspecified: Secondary | ICD-10-CM

## 2023-01-16 ENCOUNTER — Other Ambulatory Visit: Payer: Self-pay | Admitting: Family Medicine

## 2023-01-18 NOTE — Telephone Encounter (Signed)
Please advise 

## 2023-01-19 ENCOUNTER — Other Ambulatory Visit: Payer: Self-pay | Admitting: Family Medicine

## 2023-01-19 NOTE — Telephone Encounter (Signed)
Medication Refill -  Most Recent Primary Care Visit:  Provider: Merita Norton T  Department: [facility]  Visit Type: MYCHART VIDEO VISIT  Date: 06/05/2022  Medication: meloxicam (MOBIC) 15 MG tablet [[id number]]   Has the patient contacted their pharmacy? Yes  (Agent: If yes, when and what did the pharmacy advise?) Contact office   Is this the correct pharmacy for this prescription? Yes  This is the patient's preferred pharmacy:  [facility] - [place], [place] - [address] Che[address] [place] [address] Phone: [phone] Fax: [phone]   Has the prescription been filled recently? Yes  Is the patient out of the medication? Yes  Has the patient been seen for an appointment in the last year OR does the patient have an upcoming appointment? Yes  Can we respond through MyChart? No  Agent: Please be advised that Rx refills may take up to 3 business days. We ask that you follow-up with your pharmacy.   Pt's mother Eather Colas is requesting a call when the refill has been sent.

## 2023-01-20 NOTE — Telephone Encounter (Signed)
Refilled 01/19/23. Requested Prescriptions  Refused Prescriptions Disp Refills   meloxicam (MOBIC) 15 MG tablet 90 tablet 1     Analgesics:  COX2 Inhibitors Failed - 01/19/2023 10:45 AM      Failed - Manual Review: Labs are only required if the patient has taken medication for more than 8 weeks.      Passed - HGB in normal range and within 360 days    Hemoglobin  Date Value Ref Range Status  04/14/2022 14.8 11.1 - 15.9 g/dL Final         Passed - Cr in normal range and within 360 days    Creatinine, Ser  Date Value Ref Range Status  10/16/2022 0.79 0.57 - 1.00 mg/dL Final         Passed - HCT in normal range and within 360 days    Hematocrit  Date Value Ref Range Status  04/14/2022 44.8 34.0 - 46.6 % Final         Passed - AST in normal range and within 360 days    AST  Date Value Ref Range Status  10/16/2022 16 0 - 40 IU/L Final         Passed - ALT in normal range and within 360 days    ALT  Date Value Ref Range Status  10/16/2022 19 0 - 32 IU/L Final         Passed - eGFR is 30 or above and within 360 days    GFR calc Af Amer  Date Value Ref Range Status  07/05/2017 >60 >60 mL/min Final    Comment:    (NOTE) The eGFR has been calculated using the CKD EPI equation. This calculation has not been validated in all clinical situations. eGFR's persistently <60 mL/min signify possible Chronic Kidney Disease.    GFR, Estimated  Date Value Ref Range Status  09/19/2020 >60 >60 mL/min Final    Comment:    (NOTE) Calculated using the CKD-EPI Creatinine Equation (2021)    eGFR  Date Value Ref Range Status  10/16/2022 89 >59 mL/min/1.73 Final         Passed - Patient is not pregnant      Passed - Valid encounter within last 12 months    Recent Outpatient Visits           7 months ago Flexural eczema   [facility] Jacky Kindle, FNP   9 months ago Annual physical exam   [facility] Jacky Kindle, FNP    1 year ago Tachycardia   [facility] Merita Norton T, FNP   1 year ago Low blood pressure reading    [facility] Simmons-Robinson, [place], MD   1 year ago Type 2 diabetes mellitus with other neurologic complication, without long-term current use of insulin [facility])   [facility] Jacky Kindle, [place]

## 2023-03-01 ENCOUNTER — Telehealth: Payer: Self-pay | Admitting: Physician Assistant

## 2023-03-01 ENCOUNTER — Other Ambulatory Visit: Payer: Self-pay | Admitting: Physician Assistant

## 2023-03-01 MED ORDER — METFORMIN HCL ER 750 MG PO TB24: 750.0000 mg | ORAL_TABLET | Freq: Every day | ORAL | 0 refills | Status: DC

## 2023-03-01 NOTE — Telephone Encounter (Signed)
[facility] is requesting prescription refill metFORMIN (GLUCOPHAGE-XR) 750 MG 24 hr tablet   Please advise

## 2023-03-01 NOTE — Telephone Encounter (Signed)
Refill sent has appt 2/3

## 2023-03-01 NOTE — Telephone Encounter (Signed)
Medication Refill -  Most Recent Primary Care Visit:  Provider: Merita Norton T  Department: [facility]  Visit Type: MYCHART VIDEO VISIT  Date: 06/05/2022  Medication: metFORMIN (GLUCOPHAGE-XR) 750 MG 24 hr tablet   Has the patient contacted their pharmacy? Yes (Agent: If no, request that the patient contact the pharmacy for the refill. If patient does not wish to contact the pharmacy document the reason why and proceed with request.) (Agent: If yes, when and what did the pharmacy advise?)  Is this the correct pharmacy for this prescription? Yes If no, delete pharmacy and type the correct one.  This is the patient's preferred pharmacy:  [facility] - [place], [place] - [address] Che[address] [place] [address] Phone: [phone] Fax: [phone]   Has the prescription been filled recently? Yes  Is the patient out of the medication? Yes   Has the patient been seen for an appointment in the last year OR does the patient have an upcoming appointment? Yes 03/15/2023  Can we respond through MyChart? Yes  Agent: Please be advised that Rx refills may take up to 3 business days. We ask that you follow-up with your pharmacy.

## 2023-03-02 NOTE — Telephone Encounter (Signed)
Requested Prescriptions  Refused Prescriptions Disp Refills   metFORMIN (GLUCOPHAGE-XR) 750 MG 24 hr tablet 180 tablet 0    Sig: Take 1 tablet (750 mg total) by mouth daily with breakfast.     Endocrinology:  Diabetes - Biguanides Failed - 03/02/2023  8:13 AM      Failed - B12 Level in normal range and within 720 days    Vitamin B-12  Date Value Ref Range Status  04/25/2020 546 232 - 1,245 pg/mL Final         Failed - Valid encounter within last 6 months    Recent Outpatient Visits           9 months ago Flexural eczema   [facility] Jacky Kindle, FNP   10 months ago Annual physical exam   [facility] Jacky Kindle, FNP   1 year ago Tachycardia   [facility] Merita Norton T, FNP   1 year ago Low blood pressure reading   [facility] Simmons-Robinson, [place], MD   1 year ago Type 2 diabetes mellitus with other neurologic complication, without long-term current use of insulin [facility])   [facility] Merita Norton T, FNP       Future Appointments             In 1 week Debera Lat, PA-C [facility]            Passed - Cr in normal range and within 360 days    Creatinine, Ser  Date Value Ref Range Status  10/16/2022 0.79 0.57 - 1.00 mg/dL Final         Passed - HBA1C is between 0 and 7.9 and within 180 days    HB A1C (BAYER DCA - WAIVED)  Date Value Ref Range Status  07/04/2015 5.6 <7.0 % Final    Comment:                                          Diabetic Adult            <7.0                                       Healthy Adult        4.3 - 5.7                                                           (DCCT/NGSP) American Diabetes Association's Summary of Glycemic Recommendations for Adults with Diabetes: Hemoglobin A1c <7.0%. More stringent glycemic goals (A1c <6.0%) may further reduce complications at  the cost of increased risk of hypoglycemia.    Hgb A1c MFr Bld  Date Value Ref Range Status  10/16/2022 6.0 (H) 4.8 - 5.6 % Final    Comment:             Prediabetes: 5.7 - 6.4          Diabetes: >6.4          Glycemic control for adults with diabetes: <  7.0          Passed - eGFR in normal range and within 360 days    GFR calc Af Amer  Date Value Ref Range Status  07/05/2017 >60 >60 mL/min Final    Comment:    (NOTE) The eGFR has been calculated using the CKD EPI equation. This calculation has not been validated in all clinical situations. eGFR's persistently <60 mL/min signify possible Chronic Kidney Disease.    GFR, Estimated  Date Value Ref Range Status  09/19/2020 >60 >60 mL/min Final    Comment:    (NOTE) Calculated using the CKD-EPI Creatinine Equation (2021)    eGFR  Date Value Ref Range Status  10/16/2022 89 >59 mL/min/1.73 Final         Passed - CBC within normal limits and completed in the last 12 months    WBC  Date Value Ref Range Status  04/14/2022 10.2 3.4 - 10.8 x10E3/uL Final  09/19/2020 8.1 4.0 - 10.5 K/uL Final   RBC  Date Value Ref Range Status  04/14/2022 5.19 3.77 - 5.28 x10E6/uL Final  09/19/2020 4.93 3.87 - 5.11 MIL/uL Final   Hemoglobin  Date Value Ref Range Status  04/14/2022 14.8 11.1 - 15.9 g/dL Final   Hematocrit  Date Value Ref Range Status  04/14/2022 44.8 34.0 - 46.6 % Final   MCHC  Date Value Ref Range Status  04/14/2022 33.0 31.5 - 35.7 g/dL Final  32/44/0102 72.5 30.0 - 36.0 g/dL Final   [facility]  Date Value Ref Range Status  04/14/2022 28.5 26.6 - 33.0 pg Final  09/19/2020 29.2 26.0 - 34.0 pg Final   MCV  Date Value Ref Range Status  04/14/2022 86 79 - 97 fL Final   No results found for: "PLTCOUNTKUC", "LABPLAT", "POCPLA" RDW  Date Value Ref Range Status  04/14/2022 12.7 11.7 - 15.4 % Final

## 2023-03-15 ENCOUNTER — Ambulatory Visit (INDEPENDENT_AMBULATORY_CARE_PROVIDER_SITE_OTHER): Payer: MEDICAID | Admitting: Physician Assistant

## 2023-03-15 ENCOUNTER — Encounter: Payer: Self-pay | Admitting: Physician Assistant

## 2023-03-15 VITALS — BP 109/73 | HR 101 | Ht 65.5 in | Wt 171.0 lb

## 2023-03-15 DIAGNOSIS — F17200 Nicotine dependence, unspecified, uncomplicated: Secondary | ICD-10-CM

## 2023-03-15 DIAGNOSIS — F330 Major depressive disorder, recurrent, mild: Secondary | ICD-10-CM

## 2023-03-15 DIAGNOSIS — E119 Type 2 diabetes mellitus without complications: Secondary | ICD-10-CM | POA: Insufficient documentation

## 2023-03-15 DIAGNOSIS — E785 Hyperlipidemia, unspecified: Secondary | ICD-10-CM

## 2023-03-15 DIAGNOSIS — L2082 Flexural eczema: Secondary | ICD-10-CM

## 2023-03-15 DIAGNOSIS — J4489 Other specified chronic obstructive pulmonary disease: Secondary | ICD-10-CM

## 2023-03-15 DIAGNOSIS — E1149 Type 2 diabetes mellitus with other diabetic neurological complication: Secondary | ICD-10-CM

## 2023-03-15 DIAGNOSIS — E1169 Type 2 diabetes mellitus with other specified complication: Secondary | ICD-10-CM

## 2023-03-15 MED ORDER — TRIAMCINOLONE ACETONIDE 0.5 % EX OINT: 1.0000 | TOPICAL_OINTMENT | Freq: Two times a day (BID) | CUTANEOUS | 0 refills | Status: DC

## 2023-03-15 MED ORDER — ALBUTEROL SULFATE HFA 108 (90 BASE) MCG/ACT IN AERS: 2.0000 | INHALATION_SPRAY | Freq: Four times a day (QID) | RESPIRATORY_TRACT | 0 refills | Status: AC | PRN

## 2023-03-15 NOTE — Progress Notes (Signed)
Established patient visit  Patient: Becky Hubbard   DOB: 25-Oct-1970   53 y.o. Female  MRN: [id number] Visit Date: 03/15/2023  Today's healthcare provider: Debera Lat, PA-C   Chief Complaint  Patient presents with   Diabetes   Subjective       Discussed the use of AI scribe software for clinical note transcription with the patient, who gave verbal consent to proceed.  History of Present Illness   The patient presents for a routine check-up. They have a history of prediabetes, for which they have been prescribed metformin. However, the patient was unsure about the use of metformin, indicating possible confusion or lack of understanding about their medication regimen. The patient also has high cholesterol, managed with Crestor. They confirmed having enough of this medication for the year. The patient is a smoker, consuming two packs a day. They have been given Nicoderm, but it is unclear if they are using it.  The patient also has a history of asthma or COPD. They are using Trelegy and ProAir inhalers for this condition. They have a history of depression and anxiety, for which they are seeing a behavioral health specialist every three months. They are on several psychiatric medications, which seem to be causing a tremor.  The patient also has eczema, which is causing itching and discomfort. They have been using Systane and triple antibiotic for this. They have a history of an allergic reaction to oral prednisone, but topical steroids do not seem to cause this reaction.           03/15/2023    3:17 PM 04/14/2022    3:03 PM 01/14/2022    2:48 PM  Depression screen PHQ 2/9  Decreased Interest 1 0 0  Down, Depressed, Hopeless 1 0 2  PHQ - 2 Score 2 0 2  Altered sleeping 1 0 2  Tired, decreased energy 1 1 2   Change in appetite 0 0 0  Feeling bad or failure about yourself  0 0 0  Trouble concentrating 1 0 2  Moving slowly or fidgety/restless 0 1 0  Suicidal thoughts 0 0 0  PHQ-9 Score 5  2 8   Difficult doing work/chores   Not difficult at all      03/15/2023    3:20 PM 04/14/2022    3:04 PM  GAD 7 : Generalized Anxiety Score  Nervous, Anxious, on Edge 2 2  Control/stop worrying 0 0  Worry too much - different things 0 0  Trouble relaxing 1 1  Restless 1 3  Easily annoyed or irritable 0 0  Afraid - awful might happen 0 0  Total GAD 7 Score 4 6  Anxiety Difficulty  Not difficult at all    Medications: Outpatient Medications Prior to Visit  Medication Sig   aspirin EC 81 MG EC tablet Take 1 tablet (81 mg total) by mouth daily. Swallow whole.   AUSTEDO 12 MG TABS Take 1 tablet by mouth 2 (two) times daily.   buPROPion (WELLBUTRIN XL) 300 MG 24 hr tablet Take 300 mg by mouth daily.   DULoxetine (CYMBALTA) 60 MG capsule Take 1 capsule (60 mg total) by mouth daily.   EPINEPHrine (EPIPEN 2-PAK) 0.3 mg/0.3 mL IJ SOAJ injection Inject 0.3 mg into the muscle as needed for anaphylaxis.   fluticasone (FLONASE) 50 MCG/ACT nasal spray Place 2 sprays into both nostrils daily.   Fluticasone-Umeclidin-Vilant (TRELEGY ELLIPTA) 100-62.5-25 MCG/ACT AEPB Inhale 1 puff into the lungs daily.   haloperidol (HALDOL) 5 MG tablet  Take 1 tablet (5 mg total) by mouth 2 (two) times daily.   hydrOXYzine (ATARAX/VISTARIL) 50 MG tablet TAKE ONE TABLET BY MOUTH 3 TIMES DAILY AS NEEDED (DOSAGE INCREASE)   LORazepam (ATIVAN) 1 MG tablet Take 1 mg by mouth 2 (two) times daily.   meloxicam (MOBIC) 15 MG tablet TAKE 1 TABLET BY MOUTH DAILY. AVOID ADDITIONAL NSAIDS WITH USE. TAKE WITH FOOD.   metFORMIN (GLUCOPHAGE-XR) 750 MG 24 hr tablet Take 1 tablet (750 mg total) by mouth daily with breakfast.   nicotine (NICODERM CQ - DOSED IN MG/24 HOURS) 21 mg/24hr patch Place 1 patch (21 mg total) onto the skin daily.   rosuvastatin (CRESTOR) 5 MG tablet TAKE ONE TABLET BY MOUTH EVERY DAY   ziprasidone (GEODON) 80 MG capsule SMARTSIG:1 Tablet(s) By Mouth Every Evening   [DISCONTINUED] albuterol (VENTOLIN HFA) 108 (90  Base) MCG/ACT inhaler Inhale 2 puffs into the lungs every 6 (six) hours as needed for wheezing or shortness of breath.   [DISCONTINUED] triamcinolone ointment (KENALOG) 0.5 % Apply 1 Application topically 2 (two) times daily. Use for 7-10 days.   [DISCONTINUED] AUSTEDO 9 MG TABS Take 1 tablet by mouth 2 (two) times daily.   [DISCONTINUED] buPROPion (WELLBUTRIN SR) 200 MG 12 hr tablet Take 200 mg by mouth every morning.   [DISCONTINUED] EPINEPHrine 0.3 mg/0.3 mL IJ SOAJ injection Inject 0.3 mg into the muscle as needed for anaphylaxis.   No facility-administered medications prior to visit.    Review of Systems  All other systems reviewed and are negative.  All negative Except see HPI       Objective    BP 109/73   Pulse (!) 101   Ht 5' 5.5" (1.664 m)   Wt 171 lb (77.6 kg)   SpO2 99%   BMI 28.02 kg/m     Physical Exam Vitals reviewed.  Constitutional:      General: She is not in acute distress.    Appearance: Normal appearance. She is well-developed. She is not diaphoretic.  HENT:     Head: Normocephalic and atraumatic.  Eyes:     General: No scleral icterus.    Conjunctiva/sclera: Conjunctivae normal.  Neck:     Thyroid: No thyromegaly.  Cardiovascular:     Rate and Rhythm: Normal rate and regular rhythm.     Pulses: Normal pulses.     Heart sounds: Normal heart sounds. No murmur heard. Pulmonary:     Effort: Pulmonary effort is normal. No respiratory distress.     Breath sounds: Normal breath sounds. No wheezing, rhonchi or rales.  Musculoskeletal:     Cervical back: Neck supple.     Right lower leg: No edema.     Left lower leg: No edema.  Lymphadenopathy:     Cervical: No cervical adenopathy.  Skin:    General: Skin is warm and dry.     Findings: No rash.  Neurological:     Mental Status: She is alert and oriented to person, place, and time. Mental status is at baseline.  Psychiatric:        Mood and Affect: Mood normal.        Behavior: Behavior normal.       No results found for any visits on 03/15/23.      Assessment and Plan    Hyperlipidemia associated with type 2 diabetes mellitus (HCC) (Primary) - Comprehensive metabolic panel  Type 2 diabetes mellitus with other neurologic complication, without long-term current use of insulin (HCC) - Comprehensive metabolic  panel - Hemoglobin A1c - Lipid panel - TSH  COPD with asthma (HCC) - albuterol (VENTOLIN HFA) 108 (90 Base) MCG/ACT inhaler; Inhale 2 puffs into the lungs every 6 (six) hours as needed for wheezing or shortness of breath.  Dispense: 8 g; Refill: 0  Flexural eczema - triamcinolone ointment (KENALOG) 0.5 %; Apply 1 Application topically 2 (two) times daily. Use for 7-10 days.  Dispense: 30 g; Refill: 0  diabetes Elevated A1c, currently on Metformin. -Continue Metformin. -Check A1c to monitor control.  Hyperlipidemia On Crestor . -Continue Crestor . -Check lipid panel to assess control.  Behavioral Health Medications Multiple medications prescribed by behavioral health specialist. -Continue current regimen as prescribed by behavioral health specialist. -Check TSH, kidney and liver function tests.  COPD On Trelegy and ProAir. -Continue Trelegy and ProAir. -Prescribe Albuterol as rescue inhaler.  Tobacco Use Two packs per day. -Advise patient to contact CDC Quit Smoking program for assistance.  Dermatologic Eczema on arms, itching around eyes. -Prescribe steroid cream for eczema. -Advise patient to avoid scratching around eyes.  General Health Maintenance -Obtain fasting blood work including kidney and liver function tests. -Sign release form for psychiatric notes. -Schedule follow-up appointment based on blood work results.      Orders Placed This Encounter  Procedures   Comprehensive metabolic panel    Has the patient fasted?:   Yes   Hemoglobin A1c   Lipid panel    Has the patient fasted?:   Yes   TSH    No follow-ups on file.    The patient was advised to call back or seek an in-person evaluation if the symptoms worsen or if the condition fails to improve as anticipated.  I discussed the assessment and treatment plan with the patient. The patient was provided an opportunity to ask questions and all were answered. The patient agreed with the plan and demonstrated an understanding of the instructions.  I, Debera Lat, PA-C have reviewed all documentation for this visit. The documentation on 03/15/2023  for the exam, diagnosis, procedures, and orders are all accurate and complete.  Debera Lat, [facility], MMS [facility] [phone] (phone) [phone] (fax)  [facility]

## 2023-03-20 LAB — COMPREHENSIVE METABOLIC PANEL
ALT: 38 [IU]/L — ABNORMAL HIGH (ref 0–32)
AST: 22 [IU]/L (ref 0–40)
Albumin: 4.6 g/dL (ref 3.8–4.9)
Alkaline Phosphatase: 82 [IU]/L (ref 44–121)
BUN/Creatinine Ratio: 17 (ref 9–23)
BUN: 14 mg/dL (ref 6–24)
Bilirubin Total: 0.3 mg/dL (ref 0.0–1.2)
CO2: 20 mmol/L (ref 20–29)
Calcium: 9.2 mg/dL (ref 8.7–10.2)
Chloride: 103 mmol/L (ref 96–106)
Creatinine, Ser: 0.83 mg/dL (ref 0.57–1.00)
Globulin, Total: 2 g/dL (ref 1.5–4.5)
Glucose: 123 mg/dL — ABNORMAL HIGH (ref 70–99)
Potassium: 3.9 mmol/L (ref 3.5–5.2)
Sodium: 145 mmol/L — ABNORMAL HIGH (ref 134–144)
Total Protein: 6.6 g/dL (ref 6.0–8.5)
eGFR: 84 mL/min/{1.73_m2} (ref >59)

## 2023-03-20 LAB — LIPID PANEL
Chol/HDL Ratio: 3.8 {ratio} (ref 0.0–4.4)
Cholesterol, Total: 113 mg/dL (ref 100–199)
HDL: 30 mg/dL — ABNORMAL LOW (ref >39)
LDL Chol Calc (NIH): 54 mg/dL (ref 0–99)
Triglycerides: 170 mg/dL — ABNORMAL HIGH (ref 0–149)
VLDL Cholesterol Cal: 29 mg/dL (ref 5–40)

## 2023-03-20 LAB — HEMOGLOBIN A1C
Est. average glucose Bld gHb Est-mCnc: 123 mg/dL
Hgb A1c MFr Bld: 5.9 % — ABNORMAL HIGH (ref 4.8–5.6)

## 2023-03-20 LAB — TSH: TSH: 5.03 u[IU]/mL — ABNORMAL HIGH (ref 0.450–4.500)

## 2023-03-22 ENCOUNTER — Encounter: Payer: Self-pay | Admitting: Physician Assistant

## 2023-03-25 ENCOUNTER — Telehealth: Payer: Self-pay

## 2023-03-25 NOTE — Telephone Encounter (Signed)
Pt given lab results per notes of Debera Lat, PA-C on 03/22/23. Pt verbalized understanding. She says she doesn't use MyChart, so any messages sent there she will not see. Scheduled 6 mo f/u visit. If patient needs to be seen sooner, she will need to be called. There was no mention of a f/u visit in the last OV notes on 03/15/23.

## 2023-04-05 ENCOUNTER — Ambulatory Visit
Admission: RE | Admit: 2023-04-05 | Discharge: 2023-04-05 | Disposition: A | Discharge status: Home / Self Care | Payer: MEDICAID | Source: Ambulatory Visit | Attending: Acute Care | Admitting: Acute Care

## 2023-04-05 DIAGNOSIS — Z122 Encounter for screening for malignant neoplasm of respiratory organs: Secondary | ICD-10-CM | POA: Insufficient documentation

## 2023-04-05 DIAGNOSIS — F17210 Nicotine dependence, cigarettes, uncomplicated: Secondary | ICD-10-CM | POA: Insufficient documentation

## 2023-04-05 DIAGNOSIS — Z87891 Personal history of nicotine dependence: Secondary | ICD-10-CM | POA: Insufficient documentation

## 2023-04-07 LAB — HM DIABETES EYE EXAM

## 2023-04-26 ENCOUNTER — Telehealth: Payer: Self-pay | Admitting: Acute Care

## 2023-04-26 NOTE — Telephone Encounter (Signed)
 Call report received:  IMPRESSION: 1. 7.6 mm nodular appearing lesion along the right lateral tracheal wall, possibly adherent debris. Lung-RADS 0, incomplete. Recommend follow-up low-dose lung cancer screening CT in 1 month from today's study date to ensure resolution. These results will be called to the ordering clinician or representative by the Radiologist Assistant, and communication documented in the PACS or [employer]. 2.  Aortic atherosclerosis (ICD10-I70.0). 3.  Emphysema (ICD10-J43.9).

## 2023-04-27 ENCOUNTER — Ambulatory Visit: Payer: Self-pay | Admitting: Physician Assistant

## 2023-04-27 NOTE — Telephone Encounter (Signed)
 Patient called requesting a call from PCP to go over her CT Chest Lung Cancer Screening from February - showing that results populated yesterday. This RN did not read off results to patients. Please call patient back at call back # [phone].  Copied from CRM [id number]. Topic: Clinical - Lab/Test Results >> Apr 27, 2023  1:15 PM Payton Doughty wrote: Reason for CRM: pt calling fr CT screening results Reason for Disposition  Caller requesting lab results  (Exception: Routine or non-urgent lab result.)  Answer Assessment - Initial Assessment Questions 1. REASON FOR CALL or QUESTION: "What is your reason for calling today?" or "How can I best help you?" or "What question do you have that I can help answer?"     Patient would like a return call from her PCP to go over her CT Results of her Lung CT that resulted yesterday 2. CALLER: Document the source of call. (e.g., laboratory, patient).     patient  Protocols used: PCP Call - No Triage-A-AH

## 2023-04-28 ENCOUNTER — Ambulatory Visit: Payer: Self-pay

## 2023-04-28 ENCOUNTER — Telehealth: Payer: Self-pay | Admitting: Physician Assistant

## 2023-04-28 ENCOUNTER — Telehealth: Payer: Self-pay

## 2023-04-28 ENCOUNTER — Telehealth: Payer: Self-pay | Admitting: *Deleted

## 2023-04-28 NOTE — Telephone Encounter (Signed)
 Patient's mother called in requesting results, on DPR. Patient and patient's mother was on the line during call. Patient states she has had a non productive cough lately. Denies chest congestion, mucus production, increase in shortness of breath, CP/tightness, fever. Patient also denies coughing while eating or choking on food when eating.   Will forward to Maralyn Sago to advise further. Thanks.

## 2023-04-28 NOTE — Telephone Encounter (Signed)
 Spoke with pt's guardian and discussed CT chest results. Advised to follow-up with pulmonology first in a month and then with me.

## 2023-04-28 NOTE — Telephone Encounter (Signed)
 See phone note from 04/26/2023. Duplicate encounter. Will close.

## 2023-04-28 NOTE — Telephone Encounter (Signed)
 Persistant mother calling for CT results for this PT. She is on the [facility].

## 2023-04-28 NOTE — Telephone Encounter (Signed)
 Copied from CRM [id number]. Topic: Clinical - Lab/Test Results >> Apr 27, 2023  5:07 PM Everette C wrote: Reason for CRM: The patient's mother would like to be contacted by a member of clinical staff when possible to discuss the results of the patient's CT scan and a previously requested phone call from today 04/27/23  Please contact when possible

## 2023-04-28 NOTE — Telephone Encounter (Signed)
  Chief Complaint: CT chest results  Additional Notes: Patient's mother, [name] , calling for CT chest results ordered by Kandice Robinsons NP. Advised to follow up with pulmonary care regarding results and provided them with telephone number. She is concerned because she states this is the 3rd time she has reached out to [facility] regarding these results and was told yesterday she would hear back. Multiple messages in chart have been routed to PCP on 3/18 and 3/19. She states the patient is worried about her results and they don't need to keep waiting. Unable to call [facility] line as office is closed for lunch at this time. Attempted to call Pulmonary Clinic for patient and they are also closed for lunch at this time, no answer.  Copied from CRM [id number]. Topic: Clinical - Lab/Test Results >> Apr 28, 2023 12:21 PM Turkey B wrote: Reason for CRM: pt called in for her ct chest scan results Reason for Disposition  [1] Caller requesting NON-URGENT health information AND [2] PCP's office is the best resource  Answer Assessment - Initial Assessment Questions 1. REASON FOR CALL or QUESTION: "What is your reason for calling today?" or "How can I best help you?" or "What question do you have that I can help answer?"     Patient's mother on the phone asking fro Chest CT results. Informed her they were ordered by [facility] at [facility] and the provider who ordered the exam would need to discuss results with them. Provided the mother with Pulmonary Care's phone # and attempted to call, no answer. She states [employer] was supposed to get back to them yesterday about the results.  Protocols used: Information Only Call - No Triage-A-AH

## 2023-04-29 ENCOUNTER — Other Ambulatory Visit: Payer: Self-pay

## 2023-04-29 ENCOUNTER — Telehealth: Payer: Self-pay | Admitting: Acute Care

## 2023-04-29 DIAGNOSIS — R911 Solitary pulmonary nodule: Secondary | ICD-10-CM

## 2023-04-29 DIAGNOSIS — Z87891 Personal history of nicotine dependence: Secondary | ICD-10-CM

## 2023-04-29 DIAGNOSIS — Z122 Encounter for screening for malignant neoplasm of respiratory organs: Secondary | ICD-10-CM

## 2023-04-29 NOTE — Telephone Encounter (Signed)
 I have called the patient's mother , De;ores Marita Snellen with the results of the Low Dose Ct Chest. I explained that the scan was read as a LR 0, incomplete, as there was what appears to be debris in the airway. I asked the patient mother if the patient gets choked on her food, or while drinking. She states the patient has gastroparesis, and that is possible. Plan will be fore a follow up within the next 2 weeks ( 1 month after original scan was done on  04/05/2023) The scan was not read and reported to [place] until 04/26/2023. Please schedule a follow up Ct Chest within the next 2 weeks. Let PCP know the plan. Call the house number listed on the patient's contact and speak with her mother. If the home line does not work ( they have been having issues with it), use mom's cell at [phone], Mom is Rica Mast, and is her daughters legal guardian.  Thanks so much

## 2023-04-29 NOTE — Telephone Encounter (Signed)
 LVM to call office and schedule f/u CT.

## 2023-04-29 NOTE — Telephone Encounter (Signed)
 Spoke with mom. CT order placed and scheduled for 05/13/2023 at [facility].

## 2023-04-30 NOTE — Telephone Encounter (Signed)
 See phone note from Kandice Robinsons NP on 04/29/2023. Will close encounter.

## 2023-05-13 ENCOUNTER — Ambulatory Visit
Admission: RE | Admit: 2023-05-13 | Discharge: 2023-05-13 | Disposition: A | Discharge status: Home / Self Care | Payer: MEDICAID | Source: Ambulatory Visit | Attending: Physician Assistant | Admitting: Physician Assistant

## 2023-05-13 DIAGNOSIS — Z122 Encounter for screening for malignant neoplasm of respiratory organs: Secondary | ICD-10-CM | POA: Diagnosis present

## 2023-05-13 DIAGNOSIS — R911 Solitary pulmonary nodule: Secondary | ICD-10-CM | POA: Insufficient documentation

## 2023-05-13 DIAGNOSIS — Z87891 Personal history of nicotine dependence: Secondary | ICD-10-CM | POA: Insufficient documentation

## 2023-05-28 ENCOUNTER — Other Ambulatory Visit: Payer: Self-pay | Admitting: Physician Assistant

## 2023-06-04 NOTE — Telephone Encounter (Unsigned)
 Copied from CRM [id number]. Topic: Clinical - Lab/Test Results >> Jun 04, 2023 11:45 AM Baldemar Lev wrote: Reason for CRM: Pt called for CT result notes please advise   Best contact: [phone]

## 2023-06-08 ENCOUNTER — Telehealth: Payer: Self-pay | Admitting: Physician Assistant

## 2023-06-08 NOTE — Telephone Encounter (Signed)
 Copied from CRM [id number]. Topic: Clinical - Lab/Test Results >> Jun 04, 2023 11:45 AM Baldemar Lev wrote: Reason for CRM: Pt called for CT result notes please advise    Best contact: [phone]       Patient is very upset and wants a return call about results. Please follow up with patient.

## 2023-06-09 ENCOUNTER — Other Ambulatory Visit: Payer: Self-pay

## 2023-06-09 DIAGNOSIS — Z122 Encounter for screening for malignant neoplasm of respiratory organs: Secondary | ICD-10-CM

## 2023-06-09 DIAGNOSIS — Z87891 Personal history of nicotine dependence: Secondary | ICD-10-CM

## 2023-06-09 DIAGNOSIS — F17210 Nicotine dependence, cigarettes, uncomplicated: Secondary | ICD-10-CM

## 2023-06-16 ENCOUNTER — Ambulatory Visit (INDEPENDENT_AMBULATORY_CARE_PROVIDER_SITE_OTHER): Payer: MEDICAID | Admitting: Physician Assistant

## 2023-06-16 ENCOUNTER — Encounter: Payer: Self-pay | Admitting: Physician Assistant

## 2023-06-16 VITALS — BP 93/70 | HR 102 | Ht 65.5 in | Wt 175.2 lb

## 2023-06-16 DIAGNOSIS — K3184 Gastroparesis: Secondary | ICD-10-CM | POA: Diagnosis not present

## 2023-06-16 DIAGNOSIS — J438 Other emphysema: Secondary | ICD-10-CM | POA: Insufficient documentation

## 2023-06-16 DIAGNOSIS — I700 Atherosclerosis of aorta: Secondary | ICD-10-CM

## 2023-06-16 DIAGNOSIS — E1149 Type 2 diabetes mellitus with other diabetic neurological complication: Secondary | ICD-10-CM | POA: Diagnosis not present

## 2023-06-16 DIAGNOSIS — F1911 Other psychoactive substance abuse, in remission: Secondary | ICD-10-CM

## 2023-06-16 DIAGNOSIS — E1169 Type 2 diabetes mellitus with other specified complication: Secondary | ICD-10-CM | POA: Diagnosis not present

## 2023-06-16 DIAGNOSIS — E1159 Type 2 diabetes mellitus with other circulatory complications: Secondary | ICD-10-CM | POA: Diagnosis not present

## 2023-06-16 DIAGNOSIS — J432 Centrilobular emphysema: Secondary | ICD-10-CM

## 2023-06-16 DIAGNOSIS — Z1211 Encounter for screening for malignant neoplasm of colon: Secondary | ICD-10-CM

## 2023-06-16 DIAGNOSIS — I152 Hypertension secondary to endocrine disorders: Secondary | ICD-10-CM

## 2023-06-16 DIAGNOSIS — F330 Major depressive disorder, recurrent, mild: Secondary | ICD-10-CM

## 2023-06-16 DIAGNOSIS — J4489 Other specified chronic obstructive pulmonary disease: Secondary | ICD-10-CM

## 2023-06-16 DIAGNOSIS — E785 Hyperlipidemia, unspecified: Secondary | ICD-10-CM

## 2023-06-16 DIAGNOSIS — F17200 Nicotine dependence, unspecified, uncomplicated: Secondary | ICD-10-CM

## 2023-06-16 NOTE — Progress Notes (Signed)
 Established patient visit  Patient: Becky Hubbard   DOB: 04-22-1970   53 y.o. Female  MRN: [id number] Visit Date: 06/16/2023  Today's healthcare provider: Blane Bunting, PA-C   Chief Complaint  Patient presents with   Follow-up    CT Scan follow-up. Patient and her gaurdian would like to discuss results as it has been over a month   Subjective      Discussed the use of AI scribe software for clinical note transcription with the patient, who gave verbal consent to proceed.  History of Present Illness Becky Hubbard is a 53 year old female with emphysema and aortic atherosclerosis who presents for follow-up on a CT scan and management of chronic conditions.  She is following up on a CT scan for emphysema, part of her COPD management. She uses Trelegy and ProAir  inhalers. She denies shortness of breath or chest pain. Oxygen saturation is 98%.  She has aortic atherosclerosis and takes Crestor  5 mg daily. Previous blood work showed elevated triglycerides and low HDL cholesterol.   She manages prediabetes with metformin  750 mg and follows a low carb diet. She has gastroparesis, which affects her digestion of certain foods, particularly red meat, and causes occasional stomach swelling without bowel movement issues.  She is transitioning to a new psychiatrist, Dr. Alto Jew, with her mother managing her medications. She is under the care of a pulmonologist, Dr. Gregary Lean, for respiratory issues. She received a reminder for a mammogram and inquired about a repeat Cologuard test for colon cancer screening.       03/15/2023    3:17 PM 04/14/2022    3:03 PM 01/14/2022    2:48 PM  Depression screen PHQ 2/9  Decreased Interest 1 0 0  Down, Depressed, Hopeless 1 0 2  PHQ - 2 Score 2 0 2  Altered sleeping 1 0 2  Tired, decreased energy 1 1 2   Change in appetite 0 0 0  Feeling bad or failure about yourself  0 0 0  Trouble concentrating 1 0 2  Moving slowly or fidgety/restless 0 1 0  Suicidal thoughts 0  0 0  PHQ-9 Score 5 2 8   Difficult doing work/chores   Not difficult at all      03/15/2023    3:20 PM 04/14/2022    3:04 PM  GAD 7 : Generalized Anxiety Score  Nervous, Anxious, on Edge 2 2  Control/stop worrying 0 0  Worry too much - different things 0 0  Trouble relaxing 1 1  Restless 1 3  Easily annoyed or irritable 0 0  Afraid - awful might happen 0 0  Total GAD 7 Score 4 6  Anxiety Difficulty  Not difficult at all    Medications: Outpatient Medications Prior to Visit  Medication Sig   albuterol  (VENTOLIN  HFA) 108 (90 Base) MCG/ACT inhaler Inhale 2 puffs into the lungs every 6 (six) hours as needed for wheezing or shortness of breath.   aspirin  EC 81 MG EC tablet Take 1 tablet (81 mg total) by mouth daily. Swallow whole.   AUSTEDO 12 MG TABS Take 1 tablet by mouth 2 (two) times daily.   buPROPion  (WELLBUTRIN  XL) 300 MG 24 hr tablet Take 300 mg by mouth daily.   DULoxetine  (CYMBALTA ) 60 MG capsule Take 1 capsule (60 mg total) by mouth daily.   EPINEPHrine  (EPIPEN  2-PAK) 0.3 mg/0.3 mL IJ SOAJ injection Inject 0.3 mg into the muscle as needed for anaphylaxis.   Fluticasone -Umeclidin-Vilant (TRELEGY ELLIPTA ) 100-62.5-25 MCG/ACT AEPB Inhale  1 puff into the lungs daily.   haloperidol  (HALDOL ) 5 MG tablet Take 1 tablet (5 mg total) by mouth 2 (two) times daily.   hydrOXYzine  (ATARAX /VISTARIL ) 50 MG tablet TAKE ONE TABLET BY MOUTH 3 TIMES DAILY AS NEEDED (DOSAGE INCREASE)   LORazepam  (ATIVAN ) 1 MG tablet Take 1 mg by mouth 2 (two) times daily.   meloxicam  (MOBIC ) 15 MG tablet TAKE 1 TABLET BY MOUTH DAILY. AVOID ADDITIONAL NSAIDS WITH USE. TAKE WITH FOOD.   metFORMIN  (GLUCOPHAGE -XR) 750 MG 24 hr tablet TAKE 1 TABLET BY MOUTH DAILY WITH BREAKFAST   nicotine  (NICODERM CQ  - DOSED IN MG/24 HOURS) 21 mg/24hr patch Place 1 patch (21 mg total) onto the skin daily.   rosuvastatin  (CRESTOR ) 5 MG tablet TAKE ONE TABLET BY MOUTH EVERY DAY   triamcinolone  ointment (KENALOG ) 0.5 % Apply 1 Application  topically 2 (two) times daily. Use for 7-10 days.   ziprasidone  (GEODON ) 80 MG capsule SMARTSIG:1 Tablet(s) By Mouth Every Evening   fluticasone  (FLONASE ) 50 MCG/ACT nasal spray Place 2 sprays into both nostrils daily. (Patient not taking: Reported on 06/16/2023)   No facility-administered medications prior to visit.    Review of Systems All negative Except see HPI       Objective    BP 93/70 (BP Location: Right Arm, Patient Position: Sitting, Cuff Size: Normal)   Pulse (!) 102   Ht 5' 5.5" (1.664 m)   Wt 175 lb 3.2 oz (79.5 kg)   SpO2 98%   BMI 28.71 kg/m     Physical Exam Vitals reviewed.  Constitutional:      General: She is not in acute distress.    Appearance: Normal appearance. She is well-developed. She is not diaphoretic.  HENT:     Head: Normocephalic and atraumatic.     Mouth/Throat:     Comments: Repetitive movement of tongue Eyes:     General: No scleral icterus.    Conjunctiva/sclera: Conjunctivae normal.  Neck:     Thyroid: No thyromegaly.  Cardiovascular:     Rate and Rhythm: Normal rate and regular rhythm.     Pulses: Normal pulses.     Heart sounds: Normal heart sounds. No murmur heard. Pulmonary:     Effort: Pulmonary effort is normal. No respiratory distress.     Breath sounds: Normal breath sounds. No wheezing, rhonchi or rales.  Musculoskeletal:     Cervical back: Neck supple.     Right lower leg: No edema.     Left lower leg: No edema.  Lymphadenopathy:     Cervical: No cervical adenopathy.  Skin:    General: Skin is warm and dry.     Findings: No rash.  Neurological:     Mental Status: She is alert and oriented to person, place, and time. Mental status is at baseline.  Psychiatric:        Mood and Affect: Mood normal.        Behavior: Behavior normal.      No results found for any visits on 06/16/23.      Assessment & Plan Diabetes Chronic, controlled A1c 5.9. Emphasized low carbohydrate diet per ADA guidelines. Dietary  challenges due to gastroparesis require protein substitutes. - Continue metformin  750 mcg tablets. - Adhere to low carbohydrate diet. - Refer to ADA website for dietary guidance. - Consider protein substitutes due to gastroparesis. Will follow-up  Gastroparesis? Gastroparesis with stomach swelling/bloating. Requires re-evaluation and dietary adjustments. - Refer to gastroenterologist for re-evaluation. - Discuss dietary adjustments such as  small, frequent meals and avoiding high-fat and high-fiber foods. Will follow-up  Aortic atherosclerosis Aortic atherosclerosis with focus on cholesterol management to prevent cardiac complications. - Continue Crestor  5 mcg. - Monitor cholesterol levels regularly. - Adhere to low cholesterol diet and exercise regimen. - Consider omega-3 supplements.  Emphysema COPD with emphysema. No current symptoms. Importance of pulmonology follow-up and inhaler management discussed. - Continue Trelegy and ProAir  inhalers. - Consult pulmonology for regular follow-up and pulmonary function testing. - Ensure inhaler prescriptions are up to date with pulmonology.  General Health Maintenance Routine screenings and preventive health measures needed due to history of breast cancer. - Schedule Cologuard test. - Schedule mammogram.  Gastroparesis (Primary) - Ambulatory referral to Gastroenterology  Colon cancer screening - Cologuard - Change Resulting Agency to [facility]  Hyperlipidemia associated with type 2 diabetes mellitus (HCC) Chronic Continue current regimen/rosuvastatin  5 Continue low cholesterol diet and exercise Will follow-up  Hypertension associated with type 2 diabetes mellitus (HCC) Chronic and controlled, not on medications Continue current regimen/lifestyle modifications: Continue low salt diet and exercise Will follow-up   Recurrent depressive disorder, current episode mild (HCC) Hx of substance abuse Chronic and  controlled Managed by psychiatry TD noted, most likely due to psychotics Pt was requested to sign a release form for records from psychiatry  COPD with asthma (HCC) Chronic and stable Asymptomatic On inhalers. However, pt is not clear on exact names of her inhalers. She is currently on ventolin  Advised to bring her medication to the follow-up Follow-up with Dr. Gregary Lean.  Referral to [facility] for polypharmacy, assistance, adherence  Accompanied by her mother Orders Placed This Encounter  Procedures   Cologuard - Change Resulting Agency to [facility]    Specimen source:   Per Rectum [41]   Ambulatory referral to Gastroenterology    Referral Priority:   Routine    Referral Type:   Consultation    Referral Reason:   Specialty Services Required    Number of Visits Requested:   1    Return in about 6 weeks (around 07/28/2023) for chronic disease f/u. Recheck smoking, foot exam  The patient was advised to call back or seek an in-person evaluation if the symptoms worsen or if the condition fails to improve as anticipated.  I discussed the assessment and treatment plan with the patient. The patient was provided an opportunity to ask questions and all were answered. The patient agreed with the plan and demonstrated an understanding of the instructions.  I, Petula Rotolo, PA-C have reviewed all documentation for this visit. The documentation on 06/16/2023  for the exam, diagnosis, procedures, and orders are all accurate and complete.  Blane Bunting, [facility], MMS [facility] [phone] (phone) [phone] (fax)  [facility]

## 2023-06-17 ENCOUNTER — Other Ambulatory Visit: Payer: Self-pay | Admitting: Physician Assistant

## 2023-06-17 ENCOUNTER — Telehealth: Payer: Self-pay

## 2023-06-17 DIAGNOSIS — Z1231 Encounter for screening mammogram for malignant neoplasm of breast: Secondary | ICD-10-CM

## 2023-06-17 NOTE — Progress Notes (Signed)
 Care Guide Pharmacy Note  06/17/2023 Name: Becky Hubbard MRN: [id number] DOB: 10-05-1970  Referred By: Ostwalt, Janna, PA-C Reason for referral: Complex Care Management (Outreach to schedule with Pharm d )   Becky Hubbard is a 53 y.o. year old female who is a primary care patient of Ostwalt, Janna, PA-C.  Becky Hubbard was referred to the pharmacist for assistance related to: HLD and DMII  An unsuccessful telephone outreach was attempted today to contact the patient who was referred to the pharmacy team for assistance with medication management. Additional attempts will be made to contact the patient.  Lenton Rail , RMA     [facility]  [facility], [facility] Guide  Direct Dial: [phone]  Website: [email or web address]

## 2023-06-23 ENCOUNTER — Ambulatory Visit
Admission: RE | Admit: 2023-06-23 | Discharge: 2023-06-23 | Disposition: A | Discharge status: Home / Self Care | Payer: MEDICAID | Source: Ambulatory Visit | Attending: Physician Assistant | Admitting: Physician Assistant

## 2023-06-23 DIAGNOSIS — Z1231 Encounter for screening mammogram for malignant neoplasm of breast: Secondary | ICD-10-CM | POA: Insufficient documentation

## 2023-06-25 ENCOUNTER — Other Ambulatory Visit: Payer: MEDICAID | Admitting: Pharmacist

## 2023-06-25 NOTE — Progress Notes (Signed)
   06/25/2023  Patient ID: Becky Hubbard, female   DOB: [birth year], 53 y.o.   MRN: [id number]  Called and spoke with both the patient and mother/guardian on the phone today. Completed medication review as requested.   Reports medications are in a lockbox and mother follows a list of morning, midday, and afternoon medications to give patient as prescribed.   For Trelegy, reports getting samples from Dr. Viva Grise but is adherent to using nightly. No new breathing concerns at this time.   Working on getting switched from [employer] in [place] to [place] location for distance. [employer] insurance was helping to get the transition covered. Advised she is also able to call their office as well to go ahead and get the process started. Confirmed understanding.  No further concerns at this time.   Delvin File, PharmD [facility]  Phone Number: [phone]

## 2023-06-28 ENCOUNTER — Ambulatory Visit: Payer: Self-pay | Admitting: Physician Assistant

## 2023-06-28 ENCOUNTER — Other Ambulatory Visit: Payer: Self-pay | Admitting: Physician Assistant

## 2023-06-28 DIAGNOSIS — N6489 Other specified disorders of breast: Secondary | ICD-10-CM

## 2023-06-28 DIAGNOSIS — R928 Other abnormal and inconclusive findings on diagnostic imaging of breast: Secondary | ICD-10-CM

## 2023-06-30 ENCOUNTER — Ambulatory Visit
Admission: RE | Admit: 2023-06-30 | Discharge: 2023-06-30 | Disposition: A | Discharge status: Home / Self Care | Payer: MEDICAID | Source: Ambulatory Visit | Attending: Physician Assistant | Admitting: Physician Assistant

## 2023-06-30 ENCOUNTER — Ambulatory Visit: Payer: Self-pay | Admitting: Physician Assistant

## 2023-06-30 DIAGNOSIS — N6489 Other specified disorders of breast: Secondary | ICD-10-CM

## 2023-06-30 DIAGNOSIS — R928 Other abnormal and inconclusive findings on diagnostic imaging of breast: Secondary | ICD-10-CM | POA: Insufficient documentation

## 2023-07-02 ENCOUNTER — Encounter: Payer: Self-pay | Admitting: Physician Assistant

## 2023-07-06 NOTE — Telephone Encounter (Signed)
 Copied from CRM [id number]. Topic: Referral - Question >> Jul 06, 2023  1:19 PM DeAngela L wrote: Reason for CRM: Patient mother calling to ask if she could be referred to a gastro Dr in [place] cause the [place] office in [place] is a ways away from her home and she doesn't drive that far  mother number [phone] (H)

## 2023-07-07 LAB — COLOGUARD: COLOGUARD: NEGATIVE

## 2023-07-08 ENCOUNTER — Telehealth: Payer: Self-pay

## 2023-07-08 ENCOUNTER — Ambulatory Visit: Payer: Self-pay | Admitting: Physician Assistant

## 2023-07-08 NOTE — Telephone Encounter (Signed)
 Can referral be changed?   Copied from CRM [id number]. Topic: Referral - Question >> Jul 02, 2023 12:22 PM Chrystal Crape R wrote: Pt mother [name] calling for [employer] referral. Mom is not pleased wih the location of the provided referred. She would like to stay in the [place], she states she does not travel outside of [place]. >> Jul 02, 2023  3:35 PM Bearl Botts E wrote: Pt's mother called for update on request

## 2023-07-14 ENCOUNTER — Telehealth: Payer: Self-pay

## 2023-07-14 NOTE — Telephone Encounter (Signed)
 Copied from CRM [id number]. Topic: General - Other >> Jul 13, 2023  4:01 PM Essie A wrote: Reason for CRM: Patient's mom is calling again to found the status of her question regarding changing the referral to a gastro doctor.  Please return her call [phone].

## 2023-07-19 ENCOUNTER — Other Ambulatory Visit: Payer: Self-pay | Admitting: Physician Assistant

## 2023-07-30 ENCOUNTER — Ambulatory Visit: Payer: MEDICAID | Admitting: Physician Assistant

## 2023-08-17 ENCOUNTER — Ambulatory Visit: Payer: MEDICAID | Admitting: Physician Assistant

## 2023-08-18 ENCOUNTER — Other Ambulatory Visit: Payer: Self-pay | Admitting: Physician Assistant

## 2023-08-18 DIAGNOSIS — J432 Centrilobular emphysema: Secondary | ICD-10-CM

## 2023-09-07 ENCOUNTER — Telehealth: Payer: Self-pay

## 2023-09-07 NOTE — Telephone Encounter (Signed)
 Becky Hubbard

## 2023-09-07 NOTE — Telephone Encounter (Signed)
 Copied from CRM [id number]. Topic: Clinical - Medication Prior Auth >> Sep 07, 2023  1:45 PM Carlatta H wrote: Reason for CRM: Patients TRELEGY ELLIPTA  100-62.5-25 MCG/ACT AEPB [[id number]] needs to be sent to the insurance for prior authorization//Please call to advise

## 2023-09-09 NOTE — Telephone Encounter (Signed)
 Per Epic, pt has been dismissed from office.

## 2023-09-10 NOTE — Telephone Encounter (Signed)
 I am not seeing where there is a dismissal from the practice, she actually has an upcoming appointment with Becky Hubbard on 09/15/2023 @ 3:40 pm.

## 2023-09-13 ENCOUNTER — Telehealth: Payer: Self-pay

## 2023-09-13 ENCOUNTER — Other Ambulatory Visit (HOSPITAL_COMMUNITY): Payer: Self-pay

## 2023-09-13 NOTE — Telephone Encounter (Signed)
 Pharmacy Patient Advocate Encounter   Received notification from Physician's Office that prior authorization for Trelegy Ellipta  100-62.5-25MCG/ACT aerosol powder is required/requested.   Insurance verification completed.   The patient is insured through [employer] [place] .   Per test claim: PA required; PA submitted to above mentioned insurance via CoverMyMeds Key/confirmation #/EOC [id number] Status is pending

## 2023-09-13 NOTE — Telephone Encounter (Signed)
 Apologies, looks like those were past dismissals, PA has been submitted. Will send decision to the office when received. Thank you!

## 2023-09-15 ENCOUNTER — Ambulatory Visit (INDEPENDENT_AMBULATORY_CARE_PROVIDER_SITE_OTHER): Payer: MEDICAID | Admitting: Physician Assistant

## 2023-09-15 ENCOUNTER — Encounter: Payer: Self-pay | Admitting: Physician Assistant

## 2023-09-15 VITALS — BP 137/70 | HR 107 | Resp 16 | Ht 65.5 in | Wt 180.7 lb

## 2023-09-15 DIAGNOSIS — J432 Centrilobular emphysema: Secondary | ICD-10-CM

## 2023-09-15 DIAGNOSIS — F330 Major depressive disorder, recurrent, mild: Secondary | ICD-10-CM

## 2023-09-15 DIAGNOSIS — K3184 Gastroparesis: Secondary | ICD-10-CM | POA: Diagnosis not present

## 2023-09-15 DIAGNOSIS — F17200 Nicotine dependence, unspecified, uncomplicated: Secondary | ICD-10-CM

## 2023-09-15 DIAGNOSIS — E1149 Type 2 diabetes mellitus with other diabetic neurological complication: Secondary | ICD-10-CM

## 2023-09-15 DIAGNOSIS — E785 Hyperlipidemia, unspecified: Secondary | ICD-10-CM

## 2023-09-15 DIAGNOSIS — Z7984 Long term (current) use of oral hypoglycemic drugs: Secondary | ICD-10-CM

## 2023-09-15 NOTE — Progress Notes (Signed)
 Established patient visit  Patient: Becky Hubbard   DOB: Nov 03, 1970   53 y.o. Female  MRN: [id number] Visit Date: 09/15/2023  Today's healthcare provider: Jolynn Spencer, PA-C   Chief Complaint  Patient presents with   Follow-up   Diabetes   Hyperlipidemia   Subjective       Discussed the use of AI scribe software for clinical note transcription with the patient, who gave verbal consent to proceed.  History of Present Illness Becky Hubbard is a 53 year old female with diabetes, emphysema, and hypertension who presents for follow-up of her chronic conditions.  She manages her diabetes with metformin  750 mg daily but does not monitor her blood glucose levels at home. She has emphysema and uses Trelegy and ProAir  inhalers. She smokes about five cigarettes a day. She experiences no shortness of breath or chest pain. Her hypertension is controlled with daily medication, but she does not have a blood pressure cuff at home. Her cholesterol levels were elevated at her last check, and she is on medication for this. She continues to smoke. She experiences tremors and has not discussed this with her behavioral health provider. No weakness, tingling, numbness, or lumps in her neck.       09/15/2023    3:45 PM 03/15/2023    3:17 PM 04/14/2022    3:03 PM  Depression screen PHQ 2/9  Decreased Interest 0 1 0  Down, Depressed, Hopeless 0 1 0  PHQ - 2 Score 0 2 0  Altered sleeping 1 1 0  Tired, decreased energy 0 1 1  Change in appetite 0 0 0  Feeling bad or failure about yourself  0 0 0  Trouble concentrating 0 1 0  Moving slowly or fidgety/restless 0 0 1  Suicidal thoughts 0 0 0  PHQ-9 Score 1 5 2       09/15/2023    3:46 PM 03/15/2023    3:20 PM 04/14/2022    3:04 PM  GAD 7 : Generalized Anxiety Score  Nervous, Anxious, on Edge 0 2 2  Control/stop worrying 0 0 0  Worry too much - different things 0 0 0  Trouble relaxing 0 1 1  Restless 0 1 3  Easily annoyed or irritable 0 0 0  Afraid - awful  might happen 0 0 0  Total GAD 7 Score 0 4 6  Anxiety Difficulty   Not difficult at all    Medications: Outpatient Medications Prior to Visit  Medication Sig   albuterol  (VENTOLIN  HFA) 108 (90 Base) MCG/ACT inhaler Inhale 2 puffs into the lungs every 6 (six) hours as needed for wheezing or shortness of breath.   aspirin  EC 81 MG EC tablet Take 1 tablet (81 mg total) by mouth daily. Swallow whole.   AUSTEDO 12 MG TABS Take 1 tablet by mouth 2 (two) times daily.   buPROPion  (WELLBUTRIN  XL) 300 MG 24 hr tablet Take 300 mg by mouth daily.   cetirizine (ZYRTEC) 10 MG chewable tablet Chew 10 mg by mouth daily.   DULoxetine  (CYMBALTA ) 60 MG capsule Take 1 capsule (60 mg total) by mouth daily.   EPINEPHrine  (EPIPEN  2-PAK) 0.3 mg/0.3 mL IJ SOAJ injection Inject 0.3 mg into the muscle as needed for anaphylaxis.   haloperidol  (HALDOL ) 5 MG tablet Take 1 tablet (5 mg total) by mouth 2 (two) times daily.   hydrOXYzine  (ATARAX /VISTARIL ) 50 MG tablet TAKE ONE TABLET BY MOUTH 3 TIMES DAILY AS NEEDED (DOSAGE INCREASE)   LORazepam  (ATIVAN ) 1 MG  tablet Take 1 mg by mouth 2 (two) times daily.   meloxicam  (MOBIC ) 15 MG tablet TAKE 1 TABLET BY MOUTH DAILY. AVOID ADDITIONAL NSAIDS WITH USE. TAKE WITH FOOD.   metFORMIN  (GLUCOPHAGE -XR) 750 MG 24 hr tablet TAKE 1 TABLET BY MOUTH DAILY WITH BREAKFAST   Multiple Vitamin (MULTIVITAMIN) tablet Take 1 tablet by mouth daily.   rosuvastatin  (CRESTOR ) 5 MG tablet TAKE ONE TABLET BY MOUTH EVERY DAY   TRELEGY ELLIPTA  100-62.5-25 MCG/ACT AEPB INHALE 1 PUFF INTO LUNGS EVERY DAY   ziprasidone  (GEODON ) 80 MG capsule SMARTSIG:1 Tablet(s) By Mouth Every Evening   No facility-administered medications prior to visit.    Review of Systems  All other systems reviewed and are negative.  All negative Except see HPI       Objective    BP 137/70 (BP Location: Left Arm, Patient Position: Sitting, Cuff Size: Normal)   Pulse (!) 107   Resp 16   Ht 5' 5.5 (1.664 m)   Wt 180 lb  11.2 oz (82 kg)   SpO2 100%   BMI 29.61 kg/m     Physical Exam Vitals reviewed.  Constitutional:      General: She is not in acute distress.    Appearance: Normal appearance. She is well-developed. She is not diaphoretic.  HENT:     Head: Normocephalic and atraumatic.  Eyes:     General: No scleral icterus.    Conjunctiva/sclera: Conjunctivae normal.  Neck:     Thyroid: No thyromegaly.  Cardiovascular:     Rate and Rhythm: Normal rate and regular rhythm.     Pulses: Normal pulses.     Heart sounds: Normal heart sounds. No murmur heard. Pulmonary:     Effort: Pulmonary effort is normal. No respiratory distress.     Breath sounds: Normal breath sounds. No wheezing, rhonchi or rales.  Musculoskeletal:     Cervical back: Neck supple.     Right lower leg: No edema.     Left lower leg: No edema.  Lymphadenopathy:     Cervical: No cervical adenopathy.  Skin:    General: Skin is warm and dry.     Findings: No rash.  Neurological:     Mental Status: She is alert and oriented to person, place, and time. Mental status is at baseline.  Psychiatric:        Mood and Affect: Mood normal.        Behavior: Behavior normal.      No results found for any visits on 09/15/23.      Assessment & Plan Type 2 diabetes mellitus Chronic  Managed with metformin  750 mg daily. A1c previously in prediabetic range. No home glucose monitoring. - Continue metformin  750 mg daily. - Encourage low-carb diet and regular exercise. - Order fasting blood work to re-evaluate A1c. Will follow-up  Hyperlipidemia Chronic Managed with cholesterol medication. Previous cholesterol levels elevated. - Continue current cholesterol medication/crestor  5mg  daily. - Order fasting blood work to re-evaluate cholesterol levels. Advised low cholesterol diet and regular exercise Will follow-up  Essential hypertension Chronic Concerns about current blood pressure levels. No home monitoring. Not on BP  medication - Instruct to purchase a blood pressure cuff and monitor blood pressure daily, morning and evening. - Schedule follow-up in one month to review blood pressure readings. - Consider antihypertensive medication if blood pressure remains elevated.  Emphysema Chronic Managed with Trelegy and ProAir . No pulmonologist involvement. - Refer to pulmonology for evaluation and to establish care. Will follow-up  Tobacco/Nicotine  dependence,  cigarettes Current smoking of approximately five cigarettes per day. No plans to quit. - Encourage smoking cessation. 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 No recent discussion with behavioral health. - Discuss tremor with behavioral health provider for potential medication adjustment.  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 Last psychiatric visit two months ago, follow-up planned in September. - Continue current antipsychotic therapy. - Follow up with psychiatrist in September.  Elevated TSH (possible hypothyroidism) Previously elevated TSH indicating possible hypothyroidism/ - Order fasting blood work to re-evaluate TSH levels. Will follow-up  Elevated liver enzymes Previously elevated liver enzymes. - Order fasting blood work to re-evaluate liver enzyme levels. Will follow-up  Elevated creatinine (possible chronic kidney disease) Previously elevated creatinine indicating possible chronic kidney disease. - Order fasting blood work to re-evaluate kidney function. Will follow-up  Possible gastroparesis (pending GI evaluation) Gastroenterology appointment scheduled for November. - Encourage contacting gastroenterologist for potential earlier appointment via cancellation list.    Orders Placed This Encounter  Procedures   Lipid panel    Has the patient fasted?:   Yes   CBC with Differential/Platelet   Comprehensive metabolic  panel with GFR    Has the patient fasted?:   Yes   TSH   T4, free   Hemoglobin A1c    Return in about 4 weeks (around 10/13/2023) for chronic disease f/u, BP f/u.   The patient was advised to call back or seek an in-person evaluation if the symptoms worsen or if the condition fails to improve as anticipated.  I discussed the assessment and treatment plan with the patient. The patient was provided an opportunity to ask questions and all were answered. The patient agreed with the plan and demonstrated an understanding of the instructions.  I, Daelan Gatt, PA-C have reviewed all documentation for this visit. The documentation on 09/15/2023  for the exam, diagnosis, procedures, and orders are all accurate and complete.  Jolynn Spencer, [facility], MMS [facility] [phone] (phone) [phone] (fax)  [facility]

## 2023-09-16 ENCOUNTER — Telehealth: Payer: Self-pay | Admitting: Nurse Practitioner

## 2023-09-16 NOTE — Telephone Encounter (Signed)
 LVMTCB to schedule rov with Izetta Rouleau NP.

## 2023-09-16 NOTE — Telephone Encounter (Signed)
PA has been approved        Thank you

## 2023-09-16 NOTE — Telephone Encounter (Signed)
 Pharmacy Patient Advocate Encounter  Received notification from [facility] that Prior Authorization for Trelegy Ellipta  100-62.5-25MCG/ACT aerosol powder has been APPROVED from 09/13/2023 to 09/12/2024

## 2023-09-18 LAB — T4, FREE: Free T4: 1.09 ng/dL (ref 0.82–1.77)

## 2023-09-18 LAB — LIPID PANEL
Chol/HDL Ratio: 3.4 ratio (ref 0.0–4.4)
Cholesterol, Total: 119 mg/dL (ref 100–199)
HDL: 35 mg/dL — ABNORMAL LOW (ref >39)
LDL Chol Calc (NIH): 55 mg/dL (ref 0–99)
Triglycerides: 174 mg/dL — ABNORMAL HIGH (ref 0–149)
VLDL Cholesterol Cal: 29 mg/dL (ref 5–40)

## 2023-09-18 LAB — COMPREHENSIVE METABOLIC PANEL WITH GFR
ALT: 32 IU/L (ref 0–32)
AST: 23 IU/L (ref 0–40)
Albumin: 4.7 g/dL (ref 3.8–4.9)
Alkaline Phosphatase: 72 IU/L (ref 44–121)
BUN/Creatinine Ratio: 15 (ref 9–23)
BUN: 14 mg/dL (ref 6–24)
Bilirubin Total: 0.5 mg/dL (ref 0.0–1.2)
CO2: 22 mmol/L (ref 20–29)
Calcium: 9.7 mg/dL (ref 8.7–10.2)
Chloride: 104 mmol/L (ref 96–106)
Creatinine, Ser: 0.92 mg/dL (ref 0.57–1.00)
Globulin, Total: 2 g/dL (ref 1.5–4.5)
Glucose: 119 mg/dL — ABNORMAL HIGH (ref 70–99)
Potassium: 4.1 mmol/L (ref 3.5–5.2)
Sodium: 141 mmol/L (ref 134–144)
Total Protein: 6.7 g/dL (ref 6.0–8.5)
eGFR: 74 mL/min/1.73 (ref >59)

## 2023-09-18 LAB — CBC WITH DIFFERENTIAL/PLATELET
Basophils Absolute: 0.1 x10E3/uL (ref 0.0–0.2)
Basos: 1 %
EOS (ABSOLUTE): 0.7 x10E3/uL — ABNORMAL HIGH (ref 0.0–0.4)
Eos: 7 %
Hematocrit: 45.1 % (ref 34.0–46.6)
Hemoglobin: 14.1 g/dL (ref 11.1–15.9)
Immature Grans (Abs): 0 x10E3/uL (ref 0.0–0.1)
Immature Granulocytes: 0 %
Lymphocytes Absolute: 3.6 x10E3/uL — ABNORMAL HIGH (ref 0.7–3.1)
Lymphs: 36 %
MCH: 27.6 pg (ref 26.6–33.0)
MCHC: 31.3 g/dL — ABNORMAL LOW (ref 31.5–35.7)
MCV: 88 fL (ref 79–97)
Monocytes Absolute: 0.8 x10E3/uL (ref 0.1–0.9)
Monocytes: 8 %
Neutrophils Absolute: 4.8 x10E3/uL (ref 1.4–7.0)
Neutrophils: 48 %
Platelets: 276 x10E3/uL (ref 150–450)
RBC: 5.1 x10E6/uL (ref 3.77–5.28)
RDW: 13 % (ref 11.7–15.4)
WBC: 10 x10E3/uL (ref 3.4–10.8)

## 2023-09-18 LAB — HEMOGLOBIN A1C
Est. average glucose Bld gHb Est-mCnc: 123 mg/dL
Hgb A1c MFr Bld: 5.9 % — ABNORMAL HIGH (ref 4.8–5.6)

## 2023-09-18 LAB — TSH: TSH: 3.57 u[IU]/mL (ref 0.450–4.500)

## 2023-09-21 ENCOUNTER — Ambulatory Visit: Payer: Self-pay

## 2023-10-12 ENCOUNTER — Other Ambulatory Visit: Payer: Self-pay | Admitting: Physician Assistant

## 2023-10-20 NOTE — Progress Notes (Unsigned)
 Established patient visit  Patient: Becky Hubbard   DOB: [birth year]   53 y.o. Female  MRN: [id number] Visit Date: 10/21/2023  Today's healthcare provider: Jolynn Spencer, PA-C   No chief complaint on file.  Subjective       Discussed the use of AI scribe software for clinical note transcription with the patient, who gave verbal consent to proceed.  History of Present Illness Becky Hubbard is a 53 year old female with diabetes and hypertension who presents for a follow-up visit.  Her fasting blood sugar levels are stable on metformin  750 mg. She consumes bread against dietary advice and walks three times a week for 45 minutes, considering increasing her activity level. Blood pressure readings range from 138/135 to 103, monitored manually with a shared device.  She uses inhalers regularly but continues to smoke. She experiences wheezing, shortness of breath, and cough at night, with no chest pain, rapid heartbeats, dizziness, or syncope.  She takes medication for anxiety and depression and is scheduled to see her psychiatrist in October. Allergies cause sticky eyes, managed with Allegra, cetirizine, eye drops, and warm compresses.  Her mother has guardianship due to past medication management issues, and her sister assists with household tasks. She received her first pneumonia vaccine and a flu shot today, prefers not to receive the COVID vaccine, and is unsure about her hepatitis B vaccination status.       09/15/2023    3:45 PM 03/15/2023    3:17 PM 04/14/2022    3:03 PM  Depression screen PHQ 2/9  Decreased Interest 0 1 0  Down, Depressed, Hopeless 0 1 0  PHQ - 2 Score 0 2 0  Altered sleeping 1 1 0  Tired, decreased energy 0 1 1  Change in appetite 0 0 0  Feeling bad or failure about yourself  0 0 0  Trouble concentrating 0 1 0  Moving slowly or fidgety/restless 0 0 1  Suicidal thoughts 0 0 0  PHQ-9 Score 1 5 2       09/15/2023    3:46 PM 03/15/2023    3:20 PM 04/14/2022    3:04 PM   GAD 7 : Generalized Anxiety Score  Nervous, Anxious, on Edge 0 2 2  Control/stop worrying 0 0 0  Worry too much - different things 0 0 0  Trouble relaxing 0 1 1  Restless 0 1 3  Easily annoyed or irritable 0 0 0  Afraid - awful might happen 0 0 0  Total GAD 7 Score 0 4 6  Anxiety Difficulty   Not difficult at all    Medications: Outpatient Medications Prior to Visit  Medication Sig   albuterol  (VENTOLIN  HFA) 108 (90 Base) MCG/ACT inhaler Inhale 2 puffs into the lungs every 6 (six) hours as needed for wheezing or shortness of breath.   aspirin  EC 81 MG EC tablet Take 1 tablet (81 mg total) by mouth daily. Swallow whole.   AUSTEDO 12 MG TABS Take 1 tablet by mouth 2 (two) times daily.   buPROPion  (WELLBUTRIN  XL) 300 MG 24 hr tablet Take 300 mg by mouth daily.   cetirizine (ZYRTEC) 10 MG chewable tablet Chew 10 mg by mouth daily.   DULoxetine  (CYMBALTA ) 60 MG capsule Take 1 capsule (60 mg total) by mouth daily.   EPINEPHrine  (EPIPEN  2-PAK) 0.3 mg/0.3 mL IJ SOAJ injection Inject 0.3 mg into the muscle as needed for anaphylaxis.   haloperidol  (HALDOL ) 5 MG tablet Take 1 tablet (5 mg total) by  mouth 2 (two) times daily.   hydrOXYzine  (ATARAX /VISTARIL ) 50 MG tablet TAKE ONE TABLET BY MOUTH 3 TIMES DAILY AS NEEDED (DOSAGE INCREASE)   LORazepam  (ATIVAN ) 1 MG tablet Take 1 mg by mouth 2 (two) times daily.   meloxicam  (MOBIC ) 15 MG tablet TAKE 1 TABLET BY MOUTH DAILY. AVOID ADDITIONAL NSAIDS WITH USE. TAKE WITH FOOD.   metFORMIN  (GLUCOPHAGE -XR) 750 MG 24 hr tablet TAKE 1 TABLET BY MOUTH DAILY WITH BREAKFAST   Multiple Vitamin (MULTIVITAMIN) tablet Take 1 tablet by mouth daily.   rosuvastatin  (CRESTOR ) 5 MG tablet TAKE ONE TABLET BY MOUTH EVERY DAY   TRELEGY ELLIPTA  100-62.5-25 MCG/ACT AEPB INHALE 1 PUFF INTO LUNGS EVERY DAY   ziprasidone  (GEODON ) 80 MG capsule SMARTSIG:1 Tablet(s) By Mouth Every Evening   No facility-administered medications prior to visit.    Review of Systems  All other  systems reviewed and are negative.  All negative Except see HPI   {Insert previous labs (optional):23779} {See past labs  Heme  Chem  Endocrine  Serology  Results Review (optional):1}   Objective    There were no vitals taken for this visit. {Insert last BP/Wt (optional):23777}{See vitals history (optional):1}   Physical Exam Vitals reviewed.  Constitutional:      General: She is not in acute distress.    Appearance: Normal appearance. She is well-developed. She is not diaphoretic.  HENT:     Head: Normocephalic and atraumatic.  Eyes:     General: No scleral icterus.    Conjunctiva/sclera: Conjunctivae normal.  Neck:     Thyroid: No thyromegaly.  Cardiovascular:     Rate and Rhythm: Normal rate and regular rhythm.     Pulses: Normal pulses.     Heart sounds: Normal heart sounds. No murmur heard. Pulmonary:     Effort: Pulmonary effort is normal. No respiratory distress.     Breath sounds: Normal breath sounds. No wheezing, rhonchi or rales.  Musculoskeletal:     Cervical back: Neck supple.     Right lower leg: No edema.     Left lower leg: No edema.  Lymphadenopathy:     Cervical: No cervical adenopathy.  Skin:    General: Skin is warm and dry.     Findings: No rash.  Neurological:     Mental Status: She is alert and oriented to person, place, and time. Mental status is at baseline.  Psychiatric:        Mood and Affect: Mood normal.        Behavior: Behavior normal.     No results found for any visits on 10/21/23.      Assessment & Plan Type 2 diabetes mellitus Chronic and stable Diabetes well-managed. Liver function normal. Slightly low kidney function likely due to dehydration. - Continue metformin  750 mg. - Encourage drinking 8-12 glasses of water daily. - Advise increasing exercise frequency to 4-5 times a week. - Recommend visiting the American Diabetic Association website for dietary guidance. - Plan diabetic kidney evaluation in three  months. 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 Inhalers used as prescribed. Will follow-up  Tobacco dependence Continues to smoke. Cessation advised Will revisit  Hyperlipidemia Chronic and stable Cholesterol levels acceptable, HDL slightly low. - Advise increasing exercise frequency to 4-5 times a week. Continue current regimen Will follow-up  Major depressive disorder Chronic and stable Sees psychiatrist and takes medications. No acute issues. - Continue current psychiatric medications. - Follow up with psychiatrist in October.  Allergic  conjunctivitis Experiencing symptoms. Takes cetirizine at night. - Continue cetirizine. - Apply warm compresses to eyes as needed. - Use moisturizing eye drops. - Consider allergy-specific eye drops if symptoms worsen. Will follow-up  Hypertension associated with type 2 diabetes mellitus (HCC) Chronic and stable Not on BP medication Continue bp at home Continue low salt diet and exercise Will follow-up  Hyperlipidemia associated with type 2 diabetes mellitus (HCC) Chronic and stable Continue crestor  5mg  daily Reassess LP Advised low cholesterol diet and regular exercise Will follow-up  Immunization due  - Flu vaccine trivalent PF, 6mos and older(Flulaval,Afluria,Fluarix,Fluzone) - Pneumococcal conjugate vaccine 20-valent   No orders of the defined types were placed in this encounter.   No follow-ups on file.   The patient was advised to call back or seek an in-person evaluation if the symptoms worsen or if the condition fails to improve as anticipated.  I discussed the assessment and treatment plan with the patient. The patient was provided an opportunity to ask questions and all were answered. The patient agreed with the plan and demonstrated an understanding of the instructions.  I, Athalene Kolle, PA-C have reviewed all documentation for this visit.  The documentation on 10/21/2023  for the exam, diagnosis, procedures, and orders are all accurate and complete.  Jolynn Spencer, [facility], MMS [facility] [phone] (phone) [phone] (fax)  [facility]

## 2023-10-21 ENCOUNTER — Encounter: Payer: Self-pay | Admitting: Physician Assistant

## 2023-10-21 ENCOUNTER — Ambulatory Visit: Payer: MEDICAID | Admitting: Physician Assistant

## 2023-10-21 VITALS — BP 116/81 | HR 110 | Resp 16 | Ht 65.5 in | Wt 182.0 lb

## 2023-10-21 DIAGNOSIS — K3184 Gastroparesis: Secondary | ICD-10-CM

## 2023-10-21 DIAGNOSIS — Z23 Encounter for immunization: Secondary | ICD-10-CM | POA: Diagnosis not present

## 2023-10-21 DIAGNOSIS — J432 Centrilobular emphysema: Secondary | ICD-10-CM | POA: Diagnosis not present

## 2023-10-21 DIAGNOSIS — E1159 Type 2 diabetes mellitus with other circulatory complications: Secondary | ICD-10-CM

## 2023-10-21 DIAGNOSIS — F17200 Nicotine dependence, unspecified, uncomplicated: Secondary | ICD-10-CM

## 2023-10-21 DIAGNOSIS — E1149 Type 2 diabetes mellitus with other diabetic neurological complication: Secondary | ICD-10-CM

## 2023-10-21 DIAGNOSIS — E1169 Type 2 diabetes mellitus with other specified complication: Secondary | ICD-10-CM

## 2023-10-21 DIAGNOSIS — J4489 Other specified chronic obstructive pulmonary disease: Secondary | ICD-10-CM

## 2023-10-21 DIAGNOSIS — F330 Major depressive disorder, recurrent, mild: Secondary | ICD-10-CM

## 2023-10-21 DIAGNOSIS — Z1211 Encounter for screening for malignant neoplasm of colon: Secondary | ICD-10-CM

## 2023-10-21 DIAGNOSIS — H1013 Acute atopic conjunctivitis, bilateral: Secondary | ICD-10-CM

## 2023-10-21 DIAGNOSIS — I152 Hypertension secondary to endocrine disorders: Secondary | ICD-10-CM

## 2023-10-21 DIAGNOSIS — E785 Hyperlipidemia, unspecified: Secondary | ICD-10-CM

## 2024-01-20 ENCOUNTER — Other Ambulatory Visit: Payer: Self-pay | Admitting: Physician Assistant

## 2024-01-20 ENCOUNTER — Ambulatory Visit (INDEPENDENT_AMBULATORY_CARE_PROVIDER_SITE_OTHER): Payer: MEDICAID | Admitting: Physician Assistant

## 2024-01-20 ENCOUNTER — Encounter: Payer: Self-pay | Admitting: Physician Assistant

## 2024-01-20 VITALS — BP 118/81 | HR 103 | Resp 14 | Ht 65.5 in | Wt 181.9 lb

## 2024-01-20 DIAGNOSIS — E1159 Type 2 diabetes mellitus with other circulatory complications: Secondary | ICD-10-CM | POA: Diagnosis not present

## 2024-01-20 DIAGNOSIS — E1149 Type 2 diabetes mellitus with other diabetic neurological complication: Secondary | ICD-10-CM | POA: Diagnosis not present

## 2024-01-20 DIAGNOSIS — E785 Hyperlipidemia, unspecified: Secondary | ICD-10-CM

## 2024-01-20 DIAGNOSIS — E1169 Type 2 diabetes mellitus with other specified complication: Secondary | ICD-10-CM

## 2024-01-20 DIAGNOSIS — I152 Hypertension secondary to endocrine disorders: Secondary | ICD-10-CM | POA: Diagnosis not present

## 2024-01-20 DIAGNOSIS — Z7984 Long term (current) use of oral hypoglycemic drugs: Secondary | ICD-10-CM

## 2024-01-20 DIAGNOSIS — F17200 Nicotine dependence, unspecified, uncomplicated: Secondary | ICD-10-CM

## 2024-01-20 NOTE — Progress Notes (Unsigned)
 Established patient visit  Patient: Becky Hubbard   DOB: 1970-10-04   53 y.o. Female  MRN: [id number] Visit Date: 01/20/2024  Today's healthcare provider: Jolynn Spencer, PA-C   Chief Complaint  Patient presents with   Medical Management of Chronic Issues    3 month f/u- reports doing good. No other concerns   Subjective     HPI     Medical Management of Chronic Issues    Additional comments: 3 month f/u- reports doing good. No other concerns      Last edited by Wilfred Hargis RAMAN, CMA on 01/20/2024  1:11 PM.       Discussed the use of AI scribe software for clinical note transcription with the patient, who gave verbal consent to proceed.  History of Present Illness Becky Hubbard is a 53 year old female with diabetes and hyperlipidemia who presents for a routine follow-up.  She has type 2 diabetes treated with metformin . She does not routinely monitor home blood sugars. Her last A1c in August was stable. She follows a low-carb diet, has started exercising, and reports good water intake.  She smokes one pack of cigarettes daily and has done so for about 30 years.  She takes Crestor  5 mg daily for hyperlipidemia and has used omega-3 supplements for the past three months.  She is under psychiatric care and takes duloxetine  60 mg, Haldol  5 mg, and Geodon  80 mg. She has taken Austedo for six months for movement symptoms and reports good benefit without side effects.  She notes prior arm soreness after two vaccines at her last visit, now resolved.       01/20/2024    1:12 PM 09/15/2023    3:45 PM 03/15/2023    3:17 PM  Depression screen PHQ 2/9  Decreased Interest 0 0 1  Down, Depressed, Hopeless 1 0 1  PHQ - 2 Score 1 0 2  Altered sleeping 1 1 1   Tired, decreased energy 1 0 1  Change in appetite 0 0 0  Feeling bad or failure about yourself  0 0 0  Trouble concentrating 1 0 1  Moving slowly or fidgety/restless 0 0 0  Suicidal thoughts 0 0 0  PHQ-9 Score 4 1  5       Data  saved with a previous flowsheet row definition      01/20/2024    1:12 PM 09/15/2023    3:46 PM 03/15/2023    3:20 PM 04/14/2022    3:04 PM  GAD 7 : Generalized Anxiety Score  Nervous, Anxious, on Edge 1 0 2 2  Control/stop worrying 0 0 0 0  Worry too much - different things 0 0 0 0  Trouble relaxing 1 0 1 1  Restless 0 0 1 3  Easily annoyed or irritable 0 0 0 0  Afraid - awful might happen 0 0 0 0  Total GAD 7 Score 2 0 4 6  Anxiety Difficulty    Not difficult at all    Medications: Show/hide medication list[1]  Review of Systems All negative Except see HPI   {Insert previous labs (optional):23779} {See past labs  Heme  Chem  Endocrine  Serology  Results Review (optional):1}   Objective    BP 118/81   Pulse (!) 103   Resp 14   Ht 5' 5.5 (1.664 m)   Wt 181 lb 14.4 oz (82.5 kg)   SpO2 98%   BMI 29.81 kg/m  {Insert last BP/Wt (optional):23777}{See vitals history (optional):1}  Physical Exam Vitals reviewed.  Constitutional:      General: She is not in acute distress.    Appearance: Normal appearance. She is well-developed. She is not diaphoretic.  HENT:     Head: Normocephalic and atraumatic.  Eyes:     General: No scleral icterus.    Conjunctiva/sclera: Conjunctivae normal.  Neck:     Thyroid: No thyromegaly.  Cardiovascular:     Rate and Rhythm: Normal rate and regular rhythm.     Pulses: Normal pulses.     Heart sounds: Normal heart sounds. No murmur heard. Pulmonary:     Effort: Pulmonary effort is normal. No respiratory distress.     Breath sounds: Normal breath sounds. No wheezing, rhonchi or rales.  Musculoskeletal:     Cervical back: Neck supple.     Right lower leg: No edema.     Left lower leg: No edema.  Lymphadenopathy:     Cervical: No cervical adenopathy.  Skin:    General: Skin is warm and dry.     Findings: No rash.  Neurological:     Mental Status: She is alert and oriented to person, place, and time. Mental status is at baseline.   Psychiatric:        Mood and Affect: Mood normal.        Behavior: Behavior normal.      No results found for any visits on 01/20/24.      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 Neurological complications controlled with Austedo. - Continue metformin  as prescribed. - Continue Austedo as prescribed. - Follow-up in March to recheck A1c.  Hypertriglyceridemia Chronic and stable Triglyceride levels slightly elevated. Omega-3 supplements in use. - Continue omega-3 supplements. - Recheck triglyceride levels at next appointment.  Tobacco use disorder Chronic Smokes one pack per day for 30 years. Cessation advised Will follow-up  General health maintenance Blood pressure normalized. Discussed hepatitis B vaccination based on immunity. - Check hepatitis B immunity status at next visit. - Consider hepatitis B vaccination if not immune.    Orders Placed This Encounter  Procedures   Urine Microalbumin w/creat. ratio    Return in about 3 months (around 04/19/2024) for chronic disease f/u with labs/hepb.   The patient was advised to call back or seek an in-person evaluation if the symptoms worsen or if the condition fails to improve as anticipated.  I discussed the assessment and treatment plan with the patient. The patient was provided an opportunity to ask questions and all were answered. The patient agreed with the plan and demonstrated an understanding of the instructions.  I, Selwyn Reason, PA-C have reviewed all documentation for this visit. The documentation on 01/20/2024  for the exam, diagnosis, procedures, and orders are all accurate and complete.  Jolynn Spencer, [facility], MMS [facility] [phone] (phone) [phone] (fax)  [facility]    [1]  Outpatient Medications Prior to Visit  Medication Sig   albuterol  (VENTOLIN  HFA) 108 (90  Base) MCG/ACT inhaler Inhale 2 puffs into the lungs every 6 (six) hours as needed for wheezing or shortness of breath.   aspirin  EC 81 MG EC tablet Take 1 tablet (81 mg total) by mouth daily. Swallow whole.   AUSTEDO 12 MG TABS Take 1 tablet by mouth 2 (two) times daily.   buPROPion  (WELLBUTRIN  XL) 300 MG 24 hr tablet Take 300 mg by mouth daily.   cetirizine (ZYRTEC) 10 MG  chewable tablet Chew 10 mg by mouth daily.   DULoxetine  (CYMBALTA ) 60 MG capsule Take 1 capsule (60 mg total) by mouth daily.   EPINEPHrine  (EPIPEN  2-PAK) 0.3 mg/0.3 mL IJ SOAJ injection Inject 0.3 mg into the muscle as needed for anaphylaxis.   haloperidol  (HALDOL ) 5 MG tablet Take 1 tablet (5 mg total) by mouth 2 (two) times daily.   hydrOXYzine  (ATARAX /VISTARIL ) 50 MG tablet TAKE ONE TABLET BY MOUTH 3 TIMES DAILY AS NEEDED (DOSAGE INCREASE)   LORazepam  (ATIVAN ) 1 MG tablet Take 1 mg by mouth 2 (two) times daily.   meloxicam  (MOBIC ) 15 MG tablet TAKE 1 TABLET BY MOUTH DAILY. AVOID ADDITIONAL NSAIDS WITH USE. TAKE WITH FOOD.   metFORMIN  (GLUCOPHAGE -XR) 750 MG 24 hr tablet TAKE 1 TABLET BY MOUTH DAILY WITH BREAKFAST   Multiple Vitamin (MULTIVITAMIN) tablet Take 1 tablet by mouth daily.   rosuvastatin  (CRESTOR ) 5 MG tablet TAKE ONE TABLET BY MOUTH EVERY DAY   TRELEGY ELLIPTA  100-62.5-25 MCG/ACT AEPB INHALE 1 PUFF INTO LUNGS EVERY DAY   ziprasidone  (GEODON ) 80 MG capsule SMARTSIG:1 Tablet(s) By Mouth Every Evening   No facility-administered medications prior to visit.

## 2024-01-20 NOTE — Telephone Encounter (Signed)
 LOV- 10/21/2023 NOV- 01/20/2024 (TODAY) LRF- 07/19/2023 Outpatient Medication Detail   Disp Refills Start End   meloxicam  (MOBIC ) 15 MG tablet 90 tablet 1 07/19/2023 --   Sig: TAKE 1 TABLET BY MOUTH DAILY. AVOID ADDITIONAL NSAIDS WITH USE. TAKE WITH FOOD.   Sent to pharmacy as: meloxicam  (MOBIC ) 15 MG tablet   E-Prescribing Status: Receipt confirmed by pharmacy (07/19/2023  9:56 AM EDT)

## 2024-01-21 LAB — MICROALBUMIN / CREATININE URINE RATIO
Creatinine, Urine: 105.4 mg/dL
Microalb/Creat Ratio: 5 mg/g{creat} (ref 0–29)
Microalbumin, Urine: 4.9 ug/mL

## 2024-01-23 ENCOUNTER — Ambulatory Visit: Payer: Self-pay | Admitting: Physician Assistant

## 2024-01-25 ENCOUNTER — Other Ambulatory Visit: Payer: Self-pay | Admitting: Physician Assistant

## 2024-01-25 NOTE — Telephone Encounter (Unsigned)
 Copied from CRM #[id number]. Topic: Clinical - Medication Refill >> Jan 25, 2024 12:39 PM Everette C wrote: Medication: meloxicam  (MOBIC ) 15 MG tablet [[id number]]   Has the patient contacted their pharmacy? Yes (Agent: If no, request that the patient contact the pharmacy for the refill. If patient does not wish to contact the pharmacy document the reason why and proceed with request.) (Agent: If yes, when and what did the pharmacy advise?)  This is the patient's preferred pharmacy:  [facility] - [place], [place] - [address] [place] [address] Phone: [phone] Fax: [phone]  Is this the correct pharmacy for this prescription? Yes If no, delete pharmacy and type the correct one.   Has the prescription been filled recently? Yes  Is the patient out of the medication? No  Has the patient been seen for an appointment in the last year OR does the patient have an upcoming appointment? Yes  Can we respond through MyChart? No  Agent: Please be advised that Rx refills may take up to 3 business days. We ask that you follow-up with your pharmacy.

## 2024-01-27 NOTE — Telephone Encounter (Signed)
 LOV 01/20/2024 NOV 04/20/2024 LRF 07/19/2023 qty:90 r:1

## 2024-01-27 NOTE — Telephone Encounter (Signed)
 Copied from CRM #[id number]. Topic: Clinical - Medication Question >> Jan 27, 2024  9:41 AM Treva T wrote: Reason for CRM: Pt mother, [name], calling to check status of medication refill request for medication, meloxicam  (MOBIC ) 15 MG tablet.  States pharmacy has reached out multiple times with no response.  Per chart review, medication status is pending.  Advised caller, standard 3 day turn around time for processing medication refills.  Caller verbalized understanding, but would like to know if medication can be refilled as pt is out of medication.  Requesting a follow up when medication has been sent to pharmacy.  Pt mom Ph. [phone]   Preferred pharmacy:  [facility] - [place], [place] - [address] [place] [address] Phone: [phone] Fax: [phone]

## 2024-01-28 MED ORDER — MELOXICAM 15 MG PO TABS: 15.0000 mg | ORAL_TABLET | Freq: Every day | ORAL | 1 refills | Status: AC

## 2024-01-28 NOTE — Telephone Encounter (Signed)
 Requested Prescriptions  Pending Prescriptions Disp Refills   meloxicam  (MOBIC ) 15 MG tablet 90 tablet 1    Sig: Take 1 tablet (15 mg total) by mouth daily.     Analgesics:  COX2 Inhibitors Failed - 01/28/2024 11:10 AM      Failed - Manual Review: Labs are only required if the patient has taken medication for more than 8 weeks.      Passed - HGB in normal range and within 360 days    Hemoglobin  Date Value Ref Range Status  09/17/2023 14.1 11.1 - 15.9 g/dL Final         Passed - Cr in normal range and within 360 days    Creatinine, Ser  Date Value Ref Range Status  09/17/2023 0.92 0.57 - 1.00 mg/dL Final         Passed - HCT in normal range and within 360 days    Hematocrit  Date Value Ref Range Status  09/17/2023 45.1 34.0 - 46.6 % Final         Passed - AST in normal range and within 360 days    AST  Date Value Ref Range Status  09/17/2023 23 0 - 40 IU/L Final         Passed - ALT in normal range and within 360 days    ALT  Date Value Ref Range Status  09/17/2023 32 0 - 32 IU/L Final         Passed - eGFR is 30 or above and within 360 days    GFR calc Af Amer  Date Value Ref Range Status  07/05/2017 >60 >60 mL/min Final    Comment:    (NOTE) The eGFR has been calculated using the CKD EPI equation. This calculation has not been validated in all clinical situations. eGFR's persistently <60 mL/min signify possible Chronic Kidney Disease.    GFR, Estimated  Date Value Ref Range Status  09/19/2020 >60 >60 mL/min Final    Comment:    (NOTE) Calculated using the CKD-EPI Creatinine Equation (2021)    eGFR  Date Value Ref Range Status  09/17/2023 74 >59 mL/min/1.73 Final         Passed - Patient is not pregnant      Passed - Valid encounter within last 12 months    Recent Outpatient Visits           1 week ago Type 2 diabetes mellitus with other neurologic complication, without long-term current use of insulin (HCC)   [place] [facility] [place], [place], PA-C   3 months ago Type 2 diabetes mellitus with other neurologic complication, without long-term current use of insulin (HCC)   [place] [facility] [place], [place], PA-C   4 months ago Type 2 diabetes mellitus with other neurologic complication, without long-term current use of insulin [facility])   [place] [place], [place], PA-C   7 months ago Gastroparesis   [place] [place], [place], PA-C   10 months ago Hyperlipidemia associated with type 2 diabetes mellitus [facility])   [place] [facility] [place], [place], PA-C

## 2024-02-23 ENCOUNTER — Other Ambulatory Visit: Payer: Self-pay | Admitting: Physician Assistant

## 2024-02-23 ENCOUNTER — Other Ambulatory Visit: Payer: Self-pay

## 2024-02-23 ENCOUNTER — Telehealth: Payer: Self-pay

## 2024-02-23 NOTE — Telephone Encounter (Signed)
 I think a request was sent to you, I am not sure why they didn't refill it   Copied from CRM #[id number]. Topic: Clinical - Medication Question >> Feb 23, 2024 11:30 AM Deaijah H wrote: Reason for CRM: Patients mom called in to let Oswalt know if she could ok the refill request for Metformin . If needed please call

## 2024-04-20 ENCOUNTER — Ambulatory Visit: Payer: MEDICAID | Admitting: Physician Assistant
